# Patient Record
Sex: Female | Born: 1955
Health system: Southern US, Community
[De-identification: ages and names within clinical notes are randomized; demographics above are authoritative.]

## PROBLEM LIST (undated history)

## (undated) DIAGNOSIS — F1411 Cocaine abuse, in remission: Secondary | ICD-10-CM

## (undated) DIAGNOSIS — I10 Essential (primary) hypertension: Secondary | ICD-10-CM

## (undated) DIAGNOSIS — M1990 Unspecified osteoarthritis, unspecified site: Secondary | ICD-10-CM

## (undated) DIAGNOSIS — C189 Malignant neoplasm of colon, unspecified: Secondary | ICD-10-CM

## (undated) DIAGNOSIS — F32A Depression, unspecified: Secondary | ICD-10-CM

## (undated) DIAGNOSIS — I5022 Chronic systolic (congestive) heart failure: Secondary | ICD-10-CM

## (undated) DIAGNOSIS — G5600 Carpal tunnel syndrome, unspecified upper limb: Secondary | ICD-10-CM

## (undated) DIAGNOSIS — I428 Other cardiomyopathies: Secondary | ICD-10-CM

## (undated) DIAGNOSIS — E785 Hyperlipidemia, unspecified: Secondary | ICD-10-CM

## (undated) DIAGNOSIS — G629 Polyneuropathy, unspecified: Secondary | ICD-10-CM

## (undated) DIAGNOSIS — F329 Major depressive disorder, single episode, unspecified: Secondary | ICD-10-CM

## (undated) HISTORY — DX: Other cardiomyopathies: I42.8

## (undated) HISTORY — DX: Major depressive disorder, single episode, unspecified: F32.9

## (undated) HISTORY — DX: Hyperlipidemia, unspecified: E78.5

## (undated) HISTORY — DX: Carpal tunnel syndrome, unspecified upper limb: G56.00

## (undated) HISTORY — DX: Malignant neoplasm of colon, unspecified: C18.9

## (undated) HISTORY — DX: Essential (primary) hypertension: I10

## (undated) HISTORY — DX: Depression, unspecified: F32.A

## (undated) HISTORY — DX: Chronic systolic (congestive) heart failure: I50.22

## (undated) HISTORY — DX: Unspecified osteoarthritis, unspecified site: M19.90

## (undated) HISTORY — DX: Polyneuropathy, unspecified: G62.9

## (undated) HISTORY — DX: Cocaine abuse, in remission: F14.11

---

## 1999-08-02 ENCOUNTER — Encounter (HOSPITAL_COMMUNITY): Admission: RE | Admit: 1999-08-02 | Discharge: 1999-10-31 | Payer: Self-pay | Admitting: Internal Medicine

## 1999-08-03 ENCOUNTER — Ambulatory Visit (HOSPITAL_COMMUNITY): Admission: RE | Admit: 1999-08-03 | Discharge: 1999-08-03 | Payer: Self-pay | Admitting: Internal Medicine

## 1999-08-03 ENCOUNTER — Encounter: Payer: Self-pay | Admitting: Internal Medicine

## 1999-08-28 ENCOUNTER — Encounter (INDEPENDENT_AMBULATORY_CARE_PROVIDER_SITE_OTHER): Payer: Self-pay | Admitting: Specialist

## 1999-08-28 ENCOUNTER — Other Ambulatory Visit: Admission: RE | Admit: 1999-08-28 | Discharge: 1999-08-28 | Payer: Self-pay | Admitting: Obstetrics and Gynecology

## 1999-10-16 ENCOUNTER — Encounter: Payer: Self-pay | Admitting: Anesthesiology

## 1999-10-16 ENCOUNTER — Ambulatory Visit: Admission: RE | Admit: 1999-10-16 | Discharge: 1999-10-16 | Payer: Self-pay | Admitting: Obstetrics and Gynecology

## 1999-11-30 ENCOUNTER — Inpatient Hospital Stay (HOSPITAL_COMMUNITY): Admission: RE | Admit: 1999-11-30 | Discharge: 1999-12-03 | Payer: Self-pay | Admitting: Obstetrics and Gynecology

## 1999-11-30 ENCOUNTER — Encounter (INDEPENDENT_AMBULATORY_CARE_PROVIDER_SITE_OTHER): Payer: Self-pay | Admitting: Specialist

## 2001-06-19 ENCOUNTER — Encounter (INDEPENDENT_AMBULATORY_CARE_PROVIDER_SITE_OTHER): Payer: Self-pay | Admitting: Pulmonary Disease

## 2001-06-19 ENCOUNTER — Ambulatory Visit (HOSPITAL_COMMUNITY): Admission: RE | Admit: 2001-06-19 | Discharge: 2001-06-19 | Payer: Self-pay | Admitting: Family Medicine

## 2001-06-19 ENCOUNTER — Encounter: Payer: Self-pay | Admitting: Internal Medicine

## 2006-01-03 ENCOUNTER — Ambulatory Visit: Payer: Self-pay | Admitting: Internal Medicine

## 2006-01-03 ENCOUNTER — Inpatient Hospital Stay (HOSPITAL_COMMUNITY): Admission: EM | Admit: 2006-01-03 | Discharge: 2006-01-07 | Payer: Self-pay | Admitting: Emergency Medicine

## 2006-01-03 ENCOUNTER — Encounter (INDEPENDENT_AMBULATORY_CARE_PROVIDER_SITE_OTHER): Payer: Self-pay | Admitting: *Deleted

## 2006-01-03 ENCOUNTER — Ambulatory Visit: Payer: Self-pay | Admitting: Cardiology

## 2006-01-14 ENCOUNTER — Ambulatory Visit: Payer: Self-pay | Admitting: Internal Medicine

## 2006-05-23 ENCOUNTER — Ambulatory Visit: Payer: Self-pay | Admitting: Internal Medicine

## 2006-11-04 DIAGNOSIS — F1910 Other psychoactive substance abuse, uncomplicated: Secondary | ICD-10-CM | POA: Insufficient documentation

## 2006-11-04 DIAGNOSIS — I10 Essential (primary) hypertension: Secondary | ICD-10-CM

## 2006-11-04 DIAGNOSIS — F329 Major depressive disorder, single episode, unspecified: Secondary | ICD-10-CM

## 2006-11-04 DIAGNOSIS — I5022 Chronic systolic (congestive) heart failure: Secondary | ICD-10-CM

## 2006-11-04 DIAGNOSIS — E11319 Type 2 diabetes mellitus with unspecified diabetic retinopathy without macular edema: Secondary | ICD-10-CM

## 2008-01-09 ENCOUNTER — Ambulatory Visit: Payer: Self-pay | Admitting: Infectious Diseases

## 2008-01-09 ENCOUNTER — Ambulatory Visit: Payer: Self-pay | Admitting: Cardiology

## 2008-01-09 ENCOUNTER — Encounter (INDEPENDENT_AMBULATORY_CARE_PROVIDER_SITE_OTHER): Payer: Self-pay | Admitting: Emergency Medicine

## 2008-01-09 ENCOUNTER — Observation Stay (HOSPITAL_COMMUNITY): Admission: EM | Admit: 2008-01-09 | Discharge: 2008-01-10 | Payer: Self-pay | Admitting: Emergency Medicine

## 2008-01-27 ENCOUNTER — Ambulatory Visit: Payer: Self-pay | Admitting: Hospitalist

## 2008-01-27 LAB — CONVERTED CEMR LAB
BUN: 9 mg/dL (ref 6–23)
Blood Glucose, Fingerstick: 162
Calcium: 9.1 mg/dL (ref 8.4–10.5)
Creatinine, Ser: 0.72 mg/dL (ref 0.40–1.20)
Digitoxin Lvl: 0.5 ng/mL — ABNORMAL LOW (ref 0.8–2.0)
Hgb A1c MFr Bld: 7.8 %
Sodium: 138 meq/L (ref 135–145)

## 2008-06-16 ENCOUNTER — Encounter: Payer: Self-pay | Admitting: Licensed Clinical Social Worker

## 2008-08-19 HISTORY — PX: OTHER SURGICAL HISTORY: SHX169

## 2008-09-15 ENCOUNTER — Inpatient Hospital Stay (HOSPITAL_COMMUNITY): Admission: EM | Admit: 2008-09-15 | Discharge: 2008-09-24 | Payer: Self-pay | Admitting: Emergency Medicine

## 2008-09-15 ENCOUNTER — Ambulatory Visit: Payer: Self-pay | Admitting: *Deleted

## 2008-09-17 ENCOUNTER — Encounter (INDEPENDENT_AMBULATORY_CARE_PROVIDER_SITE_OTHER): Payer: Self-pay | Admitting: General Surgery

## 2008-09-19 DIAGNOSIS — C189 Malignant neoplasm of colon, unspecified: Secondary | ICD-10-CM

## 2008-09-19 HISTORY — DX: Malignant neoplasm of colon, unspecified: C18.9

## 2008-09-22 ENCOUNTER — Ambulatory Visit: Payer: Self-pay | Admitting: Hematology & Oncology

## 2008-09-27 ENCOUNTER — Encounter (INDEPENDENT_AMBULATORY_CARE_PROVIDER_SITE_OTHER): Payer: Self-pay | Admitting: *Deleted

## 2008-09-27 ENCOUNTER — Encounter (INDEPENDENT_AMBULATORY_CARE_PROVIDER_SITE_OTHER): Payer: Self-pay | Admitting: Internal Medicine

## 2008-09-27 ENCOUNTER — Encounter: Payer: Self-pay | Admitting: Internal Medicine

## 2008-09-27 ENCOUNTER — Ambulatory Visit: Payer: Self-pay | Admitting: *Deleted

## 2008-09-27 DIAGNOSIS — C189 Malignant neoplasm of colon, unspecified: Secondary | ICD-10-CM

## 2008-09-27 LAB — CONVERTED CEMR LAB
Albumin: 3.5 g/dL (ref 3.5–5.2)
Basophils Absolute: 0 10*3/uL (ref 0.0–0.1)
Basophils Relative: 0 % (ref 0–1)
Blood Glucose, Fingerstick: 175
CO2: 19 meq/L (ref 19–32)
Calcium: 8.9 mg/dL (ref 8.4–10.5)
Chloride: 109 meq/L (ref 96–112)
Creatinine, Ser: 0.75 mg/dL (ref 0.40–1.20)
Eosinophils Absolute: 0.1 10*3/uL (ref 0.0–0.7)
HCT: 36.9 % (ref 36.0–46.0)
Lymphocytes Relative: 22 % (ref 12–46)
MCV: 79.2 fL (ref 78.0–100.0)
Monocytes Absolute: 0.7 10*3/uL (ref 0.1–1.0)
Neutrophils Relative %: 66 % (ref 43–77)
RBC: 4.66 M/uL (ref 3.87–5.11)
RDW: 15 % (ref 11.5–15.5)
Total Bilirubin: 0.5 mg/dL (ref 0.3–1.2)

## 2008-10-01 ENCOUNTER — Encounter: Payer: Self-pay | Admitting: Licensed Clinical Social Worker

## 2008-10-01 ENCOUNTER — Ambulatory Visit: Payer: Self-pay | Admitting: *Deleted

## 2008-10-25 LAB — CBC WITH DIFFERENTIAL (CANCER CENTER ONLY)
BASO%: 0.4 % (ref 0.0–2.0)
EOS%: 1.7 % (ref 0.0–7.0)
HCT: 33.5 % — ABNORMAL LOW (ref 34.8–46.6)
LYMPH#: 1.1 10*3/uL (ref 0.9–3.3)
LYMPH%: 20.6 % (ref 14.0–48.0)
MCH: 26.5 pg (ref 26.0–34.0)
MCHC: 33.5 g/dL (ref 32.0–36.0)
MCV: 79 fL — ABNORMAL LOW (ref 81–101)
MONO%: 4.9 % (ref 0.0–13.0)
NEUT%: 72.4 % (ref 39.6–80.0)
RDW: 14.8 % — ABNORMAL HIGH (ref 10.5–14.6)

## 2008-10-25 LAB — COMPREHENSIVE METABOLIC PANEL
AST: 10 U/L (ref 0–37)
Alkaline Phosphatase: 70 U/L (ref 39–117)
BUN: 11 mg/dL (ref 6–23)
Calcium: 9.1 mg/dL (ref 8.4–10.5)
Creatinine, Ser: 0.81 mg/dL (ref 0.40–1.20)

## 2008-11-01 ENCOUNTER — Encounter (INDEPENDENT_AMBULATORY_CARE_PROVIDER_SITE_OTHER): Payer: Self-pay | Admitting: Internal Medicine

## 2008-11-01 ENCOUNTER — Ambulatory Visit: Payer: Self-pay | Admitting: Oncology

## 2008-11-01 LAB — CBC WITH DIFFERENTIAL/PLATELET
Basophils Absolute: 0 10*3/uL (ref 0.0–0.1)
Eosinophils Absolute: 0.1 10*3/uL (ref 0.0–0.5)
HGB: 11.9 g/dL (ref 11.6–15.9)
MCV: 79.2 fL — ABNORMAL LOW (ref 81.0–101.0)
MONO%: 6.1 % (ref 0.0–13.0)
NEUT#: 4.6 10*3/uL (ref 1.5–6.5)
Platelets: 182 10*3/uL (ref 145–400)
RDW: 14.9 % — ABNORMAL HIGH (ref 11.3–14.5)

## 2008-11-04 ENCOUNTER — Ambulatory Visit (HOSPITAL_COMMUNITY): Admission: RE | Admit: 2008-11-04 | Discharge: 2008-11-04 | Payer: Self-pay | Admitting: Hematology & Oncology

## 2008-11-05 ENCOUNTER — Ambulatory Visit (HOSPITAL_COMMUNITY): Admission: RE | Admit: 2008-11-05 | Discharge: 2008-11-05 | Payer: Self-pay | Admitting: Oncology

## 2008-11-08 ENCOUNTER — Encounter (INDEPENDENT_AMBULATORY_CARE_PROVIDER_SITE_OTHER): Payer: Self-pay | Admitting: Internal Medicine

## 2008-11-22 ENCOUNTER — Encounter (INDEPENDENT_AMBULATORY_CARE_PROVIDER_SITE_OTHER): Payer: Self-pay | Admitting: Internal Medicine

## 2008-11-22 LAB — COMPREHENSIVE METABOLIC PANEL
Albumin: 3.8 g/dL (ref 3.5–5.2)
CO2: 22 mEq/L (ref 19–32)
Calcium: 8.5 mg/dL (ref 8.4–10.5)
Chloride: 105 mEq/L (ref 96–112)
Glucose, Bld: 192 mg/dL — ABNORMAL HIGH (ref 70–99)
Potassium: 3.6 mEq/L (ref 3.5–5.3)
Sodium: 139 mEq/L (ref 135–145)
Total Bilirubin: 0.5 mg/dL (ref 0.3–1.2)
Total Protein: 6.7 g/dL (ref 6.0–8.3)

## 2008-11-22 LAB — CBC WITH DIFFERENTIAL/PLATELET
Eosinophils Absolute: 0.1 10*3/uL (ref 0.0–0.5)
LYMPH%: 27.1 % (ref 14.0–48.0)
MONO#: 0.6 10*3/uL (ref 0.1–0.9)
NEUT#: 3.2 10*3/uL (ref 1.5–6.5)
Platelets: 176 10*3/uL (ref 145–400)
RBC: 4.36 10*6/uL (ref 3.70–5.32)
WBC: 5.3 10*3/uL (ref 3.9–10.0)
lymph#: 1.4 10*3/uL (ref 0.9–3.3)

## 2008-11-29 ENCOUNTER — Telehealth (INDEPENDENT_AMBULATORY_CARE_PROVIDER_SITE_OTHER): Payer: Self-pay | Admitting: Internal Medicine

## 2008-11-30 ENCOUNTER — Encounter (INDEPENDENT_AMBULATORY_CARE_PROVIDER_SITE_OTHER): Payer: Self-pay | Admitting: Internal Medicine

## 2008-12-06 ENCOUNTER — Encounter (INDEPENDENT_AMBULATORY_CARE_PROVIDER_SITE_OTHER): Payer: Self-pay | Admitting: Internal Medicine

## 2008-12-06 LAB — CBC WITH DIFFERENTIAL/PLATELET
BASO%: 0 % (ref 0.0–2.0)
Basophils Absolute: 0 10*3/uL (ref 0.0–0.1)
EOS%: 0.8 % (ref 0.0–7.0)
HGB: 10.5 g/dL — ABNORMAL LOW (ref 11.6–15.9)
MCH: 26.9 pg (ref 26.0–34.0)
MCHC: 33.6 g/dL (ref 32.0–36.0)
RDW: 16 % — ABNORMAL HIGH (ref 11.3–14.5)
WBC: 4.1 10*3/uL (ref 3.9–10.0)
lymph#: 1.1 10*3/uL (ref 0.9–3.3)

## 2008-12-06 LAB — COMPREHENSIVE METABOLIC PANEL
ALT: 10 U/L (ref 0–35)
AST: 14 U/L (ref 0–37)
Albumin: 3.1 g/dL — ABNORMAL LOW (ref 3.5–5.2)
Calcium: 8.3 mg/dL — ABNORMAL LOW (ref 8.4–10.5)
Chloride: 107 mEq/L (ref 96–112)
Potassium: 3.9 mEq/L (ref 3.5–5.3)
Total Protein: 6.6 g/dL (ref 6.0–8.3)

## 2008-12-20 ENCOUNTER — Ambulatory Visit: Payer: Self-pay | Admitting: Oncology

## 2008-12-23 ENCOUNTER — Encounter (INDEPENDENT_AMBULATORY_CARE_PROVIDER_SITE_OTHER): Payer: Self-pay | Admitting: Internal Medicine

## 2008-12-23 LAB — CBC WITH DIFFERENTIAL/PLATELET
BASO%: 0.4 % (ref 0.0–2.0)
EOS%: 1 % (ref 0.0–7.0)
MCH: 27.9 pg (ref 26.0–34.0)
MCHC: 34 g/dL (ref 32.0–36.0)
MONO#: 0.6 10*3/uL (ref 0.1–0.9)
RBC: 4.1 10*6/uL (ref 3.70–5.32)
WBC: 3.6 10*3/uL — ABNORMAL LOW (ref 3.9–10.0)
lymph#: 0.9 10*3/uL (ref 0.9–3.3)

## 2008-12-23 LAB — COMPREHENSIVE METABOLIC PANEL
CO2: 28 mEq/L (ref 19–32)
Creatinine, Ser: 0.64 mg/dL (ref 0.40–1.20)
Glucose, Bld: 160 mg/dL — ABNORMAL HIGH (ref 70–99)
Total Bilirubin: 0.7 mg/dL (ref 0.3–1.2)

## 2009-01-06 ENCOUNTER — Encounter (INDEPENDENT_AMBULATORY_CARE_PROVIDER_SITE_OTHER): Payer: Self-pay | Admitting: Internal Medicine

## 2009-01-06 LAB — COMPREHENSIVE METABOLIC PANEL
Albumin: 3.5 g/dL (ref 3.5–5.2)
CO2: 25 mEq/L (ref 19–32)
Chloride: 106 mEq/L (ref 96–112)
Glucose, Bld: 235 mg/dL — ABNORMAL HIGH (ref 70–99)
Potassium: 3.3 mEq/L — ABNORMAL LOW (ref 3.5–5.3)
Sodium: 141 mEq/L (ref 135–145)
Total Protein: 6.3 g/dL (ref 6.0–8.3)

## 2009-01-06 LAB — CBC WITH DIFFERENTIAL/PLATELET
Eosinophils Absolute: 0 10*3/uL (ref 0.0–0.5)
MCV: 83 fL (ref 81.0–101.0)
MONO#: 0.5 10*3/uL (ref 0.1–0.9)
MONO%: 11.4 % (ref 0.0–13.0)
NEUT#: 2.8 10*3/uL (ref 1.5–6.5)
RBC: 3.93 10*6/uL (ref 3.70–5.32)
RDW: 19.2 % — ABNORMAL HIGH (ref 11.3–14.5)
WBC: 4.4 10*3/uL (ref 3.9–10.0)

## 2009-01-20 LAB — COMPREHENSIVE METABOLIC PANEL
ALT: 9 U/L (ref 0–35)
AST: 11 U/L (ref 0–37)
Albumin: 3.7 g/dL (ref 3.5–5.2)
BUN: 5 mg/dL — ABNORMAL LOW (ref 6–23)
Calcium: 8.2 mg/dL — ABNORMAL LOW (ref 8.4–10.5)
Chloride: 97 mEq/L (ref 96–112)
Potassium: 2.9 mEq/L — ABNORMAL LOW (ref 3.5–5.3)
Total Protein: 6.4 g/dL (ref 6.0–8.3)

## 2009-01-20 LAB — CBC WITH DIFFERENTIAL/PLATELET
BASO%: 0.5 % (ref 0.0–2.0)
Basophils Absolute: 0 10*3/uL (ref 0.0–0.1)
HCT: 31.6 % — ABNORMAL LOW (ref 34.8–46.6)
HGB: 10.7 g/dL — ABNORMAL LOW (ref 11.6–15.9)
MCHC: 33.9 g/dL (ref 31.5–36.0)
MONO#: 0.4 10*3/uL (ref 0.1–0.9)
NEUT%: 61.1 % (ref 38.4–76.8)
WBC: 3.1 10*3/uL — ABNORMAL LOW (ref 3.9–10.3)
lymph#: 0.8 10*3/uL — ABNORMAL LOW (ref 0.9–3.3)

## 2009-01-31 ENCOUNTER — Encounter (INDEPENDENT_AMBULATORY_CARE_PROVIDER_SITE_OTHER): Payer: Self-pay | Admitting: Internal Medicine

## 2009-02-03 LAB — CBC WITH DIFFERENTIAL/PLATELET
BASO%: 1 % (ref 0.0–2.0)
EOS%: 2.5 % (ref 0.0–7.0)
HGB: 10 g/dL — ABNORMAL LOW (ref 11.6–15.9)
MCH: 29.1 pg (ref 25.1–34.0)
MCHC: 33.7 g/dL (ref 31.5–36.0)
RBC: 3.44 10*6/uL — ABNORMAL LOW (ref 3.70–5.45)
RDW: 24 % — ABNORMAL HIGH (ref 11.2–14.5)
lymph#: 0.8 10*3/uL — ABNORMAL LOW (ref 0.9–3.3)
nRBC: 3 % — ABNORMAL HIGH (ref 0–0)

## 2009-02-03 LAB — TECHNOLOGIST REVIEW

## 2009-02-04 ENCOUNTER — Ambulatory Visit: Payer: Self-pay | Admitting: Oncology

## 2009-02-17 LAB — COMPREHENSIVE METABOLIC PANEL
Albumin: 3.4 g/dL — ABNORMAL LOW (ref 3.5–5.2)
CO2: 23 mEq/L (ref 19–32)
Chloride: 105 mEq/L (ref 96–112)
Glucose, Bld: 196 mg/dL — ABNORMAL HIGH (ref 70–99)
Potassium: 3.3 mEq/L — ABNORMAL LOW (ref 3.5–5.3)
Sodium: 139 mEq/L (ref 135–145)
Total Protein: 6.2 g/dL (ref 6.0–8.3)

## 2009-02-17 LAB — CBC WITH DIFFERENTIAL/PLATELET
Eosinophils Absolute: 0 10*3/uL (ref 0.0–0.5)
MONO#: 0.4 10*3/uL (ref 0.1–0.9)
NEUT#: 6.1 10*3/uL (ref 1.5–6.5)
Platelets: 130 10*3/uL — ABNORMAL LOW (ref 145–400)
RBC: 3.97 10*6/uL (ref 3.70–5.45)
RDW: 22.9 % — ABNORMAL HIGH (ref 11.2–14.5)
WBC: 7.5 10*3/uL (ref 3.9–10.3)
lymph#: 1 10*3/uL (ref 0.9–3.3)

## 2009-02-21 ENCOUNTER — Ambulatory Visit: Payer: Self-pay | Admitting: Internal Medicine

## 2009-02-21 ENCOUNTER — Encounter (INDEPENDENT_AMBULATORY_CARE_PROVIDER_SITE_OTHER): Payer: Self-pay | Admitting: Internal Medicine

## 2009-02-21 LAB — CONVERTED CEMR LAB
Alkaline Phosphatase: 81 units/L (ref 39–117)
Calcium: 8.8 mg/dL (ref 8.4–10.5)
Chloride: 105 meq/L (ref 96–112)
Cholesterol: 200 mg/dL (ref 0–200)
Creatinine, Ser: 0.68 mg/dL (ref 0.40–1.20)
GFR calc Af Amer: >60 mL/min (ref >60)
Glucose, Bld: 266 mg/dL — ABNORMAL HIGH (ref 70–99)
HDL: 57 mg/dL (ref >39)
LDL Cholesterol: 110 mg/dL — ABNORMAL HIGH (ref 0–99)
Pro B Natriuretic peptide (BNP): 246 pg/mL — ABNORMAL HIGH (ref 0.0–100.0)
Total Bilirubin: 1.1 mg/dL (ref 0.3–1.2)
Triglycerides: 166 mg/dL — ABNORMAL HIGH (ref <150)
VLDL: 33 mg/dL (ref 0–40)

## 2009-03-03 LAB — COMPREHENSIVE METABOLIC PANEL
ALT: 12 U/L (ref 0–35)
AST: 17 U/L (ref 0–37)
CO2: 22 mEq/L (ref 19–32)
Sodium: 138 mEq/L (ref 135–145)
Total Bilirubin: 0.4 mg/dL (ref 0.3–1.2)
Total Protein: 5.8 g/dL — ABNORMAL LOW (ref 6.0–8.3)

## 2009-03-03 LAB — CBC WITH DIFFERENTIAL/PLATELET
BASO%: 0.4 % (ref 0.0–2.0)
Eosinophils Absolute: 0 10*3/uL (ref 0.0–0.5)
MCHC: 33.1 g/dL (ref 31.5–36.0)
MCV: 87.7 fL (ref 79.5–101.0)
MONO#: 0.5 10*3/uL (ref 0.1–0.9)
MONO%: 10.9 % (ref 0.0–14.0)
NEUT#: 2.9 10*3/uL (ref 1.5–6.5)
RBC: 3.99 10*6/uL (ref 3.70–5.45)
RDW: 17 % — ABNORMAL HIGH (ref 11.2–14.5)
WBC: 4.7 10*3/uL (ref 3.9–10.3)
nRBC: 0 % (ref 0–0)

## 2009-03-08 ENCOUNTER — Ambulatory Visit: Payer: Self-pay | Admitting: Internal Medicine

## 2009-03-17 ENCOUNTER — Encounter (INDEPENDENT_AMBULATORY_CARE_PROVIDER_SITE_OTHER): Payer: Self-pay | Admitting: Internal Medicine

## 2009-03-17 LAB — CBC WITH DIFFERENTIAL/PLATELET
BASO%: 0.2 % (ref 0.0–2.0)
EOS%: 1.2 % (ref 0.0–7.0)
LYMPH%: 23.3 % (ref 14.0–49.7)
MCH: 29.6 pg (ref 25.1–34.0)
MCHC: 33.7 g/dL (ref 31.5–36.0)
MCV: 87.8 fL (ref 79.5–101.0)
MONO#: 0.6 10*3/uL (ref 0.1–0.9)
MONO%: 10.2 % (ref 0.0–14.0)
NEUT%: 65.1 % (ref 38.4–76.8)
Platelets: 101 10*3/uL — ABNORMAL LOW (ref 145–400)
RBC: 3.95 10*6/uL (ref 3.70–5.45)
WBC: 6 10*3/uL (ref 3.9–10.3)
nRBC: 0 % (ref 0–0)

## 2009-03-17 LAB — COMPREHENSIVE METABOLIC PANEL
ALT: 10 U/L (ref 0–35)
AST: 17 U/L (ref 0–37)
Alkaline Phosphatase: 99 U/L (ref 39–117)
BUN: 15 mg/dL (ref 6–23)
Creatinine, Ser: 0.66 mg/dL (ref 0.40–1.20)
Total Bilirubin: 0.5 mg/dL (ref 0.3–1.2)

## 2009-03-17 LAB — CEA: CEA: 1.2 ng/mL (ref 0.0–5.0)

## 2009-03-22 ENCOUNTER — Ambulatory Visit: Payer: Self-pay | Admitting: Internal Medicine

## 2009-03-22 ENCOUNTER — Encounter (INDEPENDENT_AMBULATORY_CARE_PROVIDER_SITE_OTHER): Payer: Self-pay | Admitting: Internal Medicine

## 2009-03-23 ENCOUNTER — Encounter (INDEPENDENT_AMBULATORY_CARE_PROVIDER_SITE_OTHER): Payer: Self-pay | Admitting: Internal Medicine

## 2009-03-28 ENCOUNTER — Encounter (INDEPENDENT_AMBULATORY_CARE_PROVIDER_SITE_OTHER): Payer: Self-pay | Admitting: Internal Medicine

## 2009-03-29 ENCOUNTER — Ambulatory Visit: Payer: Self-pay | Admitting: Oncology

## 2009-03-31 ENCOUNTER — Encounter (INDEPENDENT_AMBULATORY_CARE_PROVIDER_SITE_OTHER): Payer: Self-pay | Admitting: Internal Medicine

## 2009-03-31 LAB — CBC WITH DIFFERENTIAL/PLATELET
BASO%: 0.3 % (ref 0.0–2.0)
Basophils Absolute: 0 10*3/uL (ref 0.0–0.1)
EOS%: 0.6 % (ref 0.0–7.0)
Eosinophils Absolute: 0 10*3/uL (ref 0.0–0.5)
HCT: 36.5 % (ref 34.8–46.6)
HGB: 12.5 g/dL (ref 11.6–15.9)
LYMPH%: 19 % (ref 14.0–49.7)
MCH: 30.2 pg (ref 25.1–34.0)
MCHC: 34.2 g/dL (ref 31.5–36.0)
MCV: 88.2 fL (ref 79.5–101.0)
MONO#: 0.7 10*3/uL (ref 0.1–0.9)
MONO%: 9.8 % (ref 0.0–14.0)
NEUT#: 4.9 10*3/uL (ref 1.5–6.5)
NEUT%: 70.3 % (ref 38.4–76.8)
Platelets: 122 10*3/uL — ABNORMAL LOW (ref 145–400)
RBC: 4.14 10*6/uL (ref 3.70–5.45)
RDW: 16.5 % — ABNORMAL HIGH (ref 11.2–14.5)
WBC: 6.9 10*3/uL (ref 3.9–10.3)
lymph#: 1.3 10*3/uL (ref 0.9–3.3)

## 2009-03-31 LAB — COMPREHENSIVE METABOLIC PANEL
Albumin: 3.8 g/dL (ref 3.5–5.2)
Alkaline Phosphatase: 108 U/L (ref 39–117)
BUN: 13 mg/dL (ref 6–23)
Creatinine, Ser: 0.75 mg/dL (ref 0.40–1.20)
Glucose, Bld: 169 mg/dL — ABNORMAL HIGH (ref 70–99)
Total Bilirubin: 0.6 mg/dL (ref 0.3–1.2)

## 2009-04-14 ENCOUNTER — Encounter (INDEPENDENT_AMBULATORY_CARE_PROVIDER_SITE_OTHER): Payer: Self-pay | Admitting: Internal Medicine

## 2009-04-14 LAB — CBC WITH DIFFERENTIAL/PLATELET
Basophils Absolute: 0 10*3/uL (ref 0.0–0.1)
Eosinophils Absolute: 0 10*3/uL (ref 0.0–0.5)
HCT: 35.7 % (ref 34.8–46.6)
HGB: 12 g/dL (ref 11.6–15.9)
MCV: 90.5 fL (ref 79.5–101.0)
MONO%: 9 % (ref 0.0–14.0)
NEUT#: 2.6 10*3/uL (ref 1.5–6.5)
Platelets: 109 10*3/uL — ABNORMAL LOW (ref 145–400)
RDW: 17.1 % — ABNORMAL HIGH (ref 11.2–14.5)

## 2009-04-14 LAB — COMPREHENSIVE METABOLIC PANEL
Albumin: 3.6 g/dL (ref 3.5–5.2)
CO2: 24 mEq/L (ref 19–32)
Glucose, Bld: 147 mg/dL — ABNORMAL HIGH (ref 70–99)
Sodium: 141 mEq/L (ref 135–145)
Total Bilirubin: 0.5 mg/dL (ref 0.3–1.2)
Total Protein: 6.1 g/dL (ref 6.0–8.3)

## 2009-04-28 ENCOUNTER — Ambulatory Visit (HOSPITAL_COMMUNITY): Admission: RE | Admit: 2009-04-28 | Discharge: 2009-04-28 | Payer: Self-pay | Admitting: Oncology

## 2009-04-28 LAB — COMPREHENSIVE METABOLIC PANEL
ALT: 9 U/L (ref 0–35)
AST: 11 U/L (ref 0–37)
Albumin: 3.6 g/dL (ref 3.5–5.2)
CO2: 23 mEq/L (ref 19–32)
Calcium: 8.9 mg/dL (ref 8.4–10.5)
Chloride: 107 mEq/L (ref 96–112)
Creatinine, Ser: 0.77 mg/dL (ref 0.40–1.20)
Potassium: 4 mEq/L (ref 3.5–5.3)
Sodium: 141 mEq/L (ref 135–145)
Total Protein: 6.4 g/dL (ref 6.0–8.3)

## 2009-04-28 LAB — CBC WITH DIFFERENTIAL/PLATELET
BASO%: 0.2 % (ref 0.0–2.0)
EOS%: 0.8 % (ref 0.0–7.0)
HCT: 34.3 % — ABNORMAL LOW (ref 34.8–46.6)
MCH: 29.6 pg (ref 25.1–34.0)
MCHC: 33.5 g/dL (ref 31.5–36.0)
MONO#: 0.6 10*3/uL (ref 0.1–0.9)
NEUT%: 65.2 % (ref 38.4–76.8)
RBC: 3.89 10*6/uL (ref 3.70–5.45)
WBC: 5.9 10*3/uL (ref 3.9–10.3)
lymph#: 1.4 10*3/uL (ref 0.9–3.3)
nRBC: 0 % (ref 0–0)

## 2009-04-28 LAB — URINALYSIS, MICROSCOPIC - CHCC
Bilirubin (Urine): NEGATIVE
Leukocyte Esterase: NEGATIVE
Protein: 100 mg/dL
WBC, UA: NEGATIVE (ref 0–2)
pH: 5 (ref 4.6–8.0)

## 2009-05-24 ENCOUNTER — Telehealth: Payer: Self-pay | Admitting: *Deleted

## 2009-05-31 ENCOUNTER — Ambulatory Visit: Payer: Self-pay | Admitting: Oncology

## 2009-06-02 ENCOUNTER — Encounter: Payer: Self-pay | Admitting: Internal Medicine

## 2009-06-02 LAB — CBC WITH DIFFERENTIAL/PLATELET
BASO%: 0.4 % (ref 0.0–2.0)
Basophils Absolute: 0 10*3/uL (ref 0.0–0.1)
HCT: 36.2 % (ref 34.8–46.6)
HGB: 12.3 g/dL (ref 11.6–15.9)
LYMPH%: 24.6 % (ref 14.0–49.7)
MCH: 30.4 pg (ref 25.1–34.0)
MCHC: 33.9 g/dL (ref 31.5–36.0)
MONO#: 0.4 10*3/uL (ref 0.1–0.9)
NEUT%: 66.9 % (ref 38.4–76.8)
Platelets: 144 10*3/uL — ABNORMAL LOW (ref 145–400)
WBC: 5.1 10*3/uL (ref 3.9–10.3)
lymph#: 1.2 10*3/uL (ref 0.9–3.3)

## 2009-06-02 LAB — COMPREHENSIVE METABOLIC PANEL
ALT: 11 U/L (ref 0–35)
BUN: 22 mg/dL (ref 6–23)
CO2: 21 mEq/L (ref 19–32)
Calcium: 9.2 mg/dL (ref 8.4–10.5)
Chloride: 110 mEq/L (ref 96–112)
Creatinine, Ser: 0.88 mg/dL (ref 0.40–1.20)
Glucose, Bld: 149 mg/dL — ABNORMAL HIGH (ref 70–99)
Total Bilirubin: 0.3 mg/dL (ref 0.3–1.2)

## 2009-06-02 LAB — CEA: CEA: 0.5 ng/mL (ref 0.0–5.0)

## 2009-07-11 ENCOUNTER — Encounter: Payer: Self-pay | Admitting: Internal Medicine

## 2009-07-18 ENCOUNTER — Encounter: Payer: Self-pay | Admitting: Internal Medicine

## 2009-07-26 ENCOUNTER — Encounter: Payer: Self-pay | Admitting: Internal Medicine

## 2009-07-26 ENCOUNTER — Ambulatory Visit: Payer: Self-pay | Admitting: Infectious Diseases

## 2009-07-26 LAB — CONVERTED CEMR LAB
Alkaline Phosphatase: 73 units/L (ref 39–117)
BUN: 14 mg/dL (ref 6–23)
CO2: 21 meq/L (ref 19–32)
Chloride: 109 meq/L (ref 96–112)
Glucose, Bld: 144 mg/dL — ABNORMAL HIGH (ref 70–99)
Hemoglobin: 13.3 g/dL (ref 12.0–15.0)
MCHC: 33.3 g/dL (ref 30.0–36.0)
MCV: 87.3 fL (ref >=78.0)
Total Protein: 6.9 g/dL (ref 6.0–8.3)

## 2009-08-03 ENCOUNTER — Telehealth: Payer: Self-pay | Admitting: *Deleted

## 2009-08-10 ENCOUNTER — Ambulatory Visit: Payer: Self-pay | Admitting: Oncology

## 2009-09-07 ENCOUNTER — Encounter: Payer: Self-pay | Admitting: Internal Medicine

## 2009-09-07 LAB — COMPREHENSIVE METABOLIC PANEL WITH GFR
ALT: 8 U/L (ref 0–35)
AST: 11 U/L (ref 0–37)
Albumin: 4 g/dL (ref 3.5–5.2)
Alkaline Phosphatase: 64 U/L (ref 39–117)
BUN: 8 mg/dL (ref 6–23)
CO2: 28 meq/L (ref 19–32)
Calcium: 9 mg/dL (ref 8.4–10.5)
Chloride: 107 meq/L (ref 96–112)
Creatinine, Ser: 0.79 mg/dL (ref 0.40–1.20)
Glucose, Bld: 138 mg/dL — ABNORMAL HIGH (ref 70–99)
Potassium: 4.5 meq/L (ref 3.5–5.3)
Sodium: 144 meq/L (ref 135–145)
Total Bilirubin: 0.5 mg/dL (ref 0.3–1.2)
Total Protein: 6.9 g/dL (ref 6.0–8.3)

## 2009-09-07 LAB — CBC WITH DIFFERENTIAL/PLATELET
BASO%: 0.3 % (ref 0.0–2.0)
Basophils Absolute: 0 10*3/uL (ref 0.0–0.1)
EOS%: 0.6 % (ref 0.0–7.0)
Eosinophils Absolute: 0 10*3/uL (ref 0.0–0.5)
HCT: 37.3 % (ref 34.8–46.6)
HGB: 12.7 g/dL (ref 11.6–15.9)
LYMPH%: 18.7 % (ref 14.0–49.7)
MCH: 29.7 pg (ref 25.1–34.0)
MCHC: 34.1 g/dL (ref 31.5–36.0)
MCV: 86.9 fL (ref 79.5–101.0)
MONO#: 0.3 10*3/uL (ref 0.1–0.9)
MONO%: 6.2 % (ref 0.0–14.0)
NEUT#: 3.9 10*3/uL (ref 1.5–6.5)
NEUT%: 74.2 % (ref 38.4–76.8)
Platelets: 141 10*3/uL — ABNORMAL LOW (ref 145–400)
RBC: 4.29 10*6/uL (ref 3.70–5.45)
RDW: 14.1 % (ref 11.2–14.5)
WBC: 5.2 10*3/uL (ref 3.9–10.3)
lymph#: 1 10*3/uL (ref 0.9–3.3)

## 2009-09-07 LAB — CEA: CEA: <0.5 ng/mL (ref 0.0–5.0)

## 2009-11-01 ENCOUNTER — Ambulatory Visit (HOSPITAL_COMMUNITY): Admission: RE | Admit: 2009-11-01 | Discharge: 2009-11-01 | Payer: Self-pay | Admitting: Oncology

## 2009-12-20 ENCOUNTER — Ambulatory Visit: Payer: Self-pay | Admitting: Oncology

## 2009-12-23 ENCOUNTER — Encounter: Payer: Self-pay | Admitting: Internal Medicine

## 2009-12-23 LAB — CBC WITH DIFFERENTIAL/PLATELET
Eosinophils Absolute: 0 10*3/uL (ref 0.0–0.5)
HCT: 40.1 % (ref 34.8–46.6)
HGB: 13.7 g/dL (ref 11.6–15.9)
LYMPH%: 19.2 % (ref 14.0–49.7)
MONO#: 0.4 10*3/uL (ref 0.1–0.9)
NEUT#: 5.3 10*3/uL (ref 1.5–6.5)
Platelets: 150 10*3/uL (ref 145–400)
RBC: 4.52 10*6/uL (ref 3.70–5.45)
WBC: 7.2 10*3/uL (ref 3.9–10.3)

## 2009-12-23 LAB — COMPREHENSIVE METABOLIC PANEL
Albumin: 4.1 g/dL (ref 3.5–5.2)
CO2: 26 mEq/L (ref 19–32)
Glucose, Bld: 141 mg/dL — ABNORMAL HIGH (ref 70–99)
Sodium: 143 mEq/L (ref 135–145)
Total Bilirubin: 0.5 mg/dL (ref 0.3–1.2)
Total Protein: 7 g/dL (ref 6.0–8.3)

## 2009-12-23 LAB — CEA: CEA: 0.8 ng/mL (ref 0.0–5.0)

## 2009-12-26 ENCOUNTER — Encounter: Payer: Self-pay | Admitting: Internal Medicine

## 2009-12-29 ENCOUNTER — Ambulatory Visit: Payer: Self-pay | Admitting: Internal Medicine

## 2009-12-29 DIAGNOSIS — E785 Hyperlipidemia, unspecified: Secondary | ICD-10-CM

## 2009-12-30 ENCOUNTER — Encounter: Payer: Self-pay | Admitting: Internal Medicine

## 2010-01-01 LAB — CONVERTED CEMR LAB
Albumin: 3.8 g/dL (ref 3.5–5.2)
BUN: 12 mg/dL (ref 6–23)
Chloride: 106 meq/L (ref 96–112)
Cholesterol: 196 mg/dL (ref 0–200)
Creatinine, Ser: 0.73 mg/dL (ref 0.40–1.20)
Glucose, Bld: 121 mg/dL — ABNORMAL HIGH (ref 70–99)
HDL: 70 mg/dL (ref >39)
Hemoglobin: 13.2 g/dL (ref 12.0–15.0)
LDL Cholesterol: 105 mg/dL — ABNORMAL HIGH (ref 0–99)
MCV: 90.5 fL (ref >=78.0)
Platelets: 138 10*3/uL — ABNORMAL LOW (ref 150–400)
RDW: 14.1 % (ref 11.5–15.5)
Total Bilirubin: 0.4 mg/dL (ref 0.3–1.2)
Total CHOL/HDL Ratio: 2.8
Total Protein: 6.7 g/dL (ref 6.0–8.3)
Triglycerides: 106 mg/dL (ref <150)

## 2010-01-27 ENCOUNTER — Telehealth: Payer: Self-pay | Admitting: *Deleted

## 2010-02-08 ENCOUNTER — Ambulatory Visit: Payer: Self-pay | Admitting: Infectious Diseases

## 2010-02-10 ENCOUNTER — Telehealth: Payer: Self-pay | Admitting: Licensed Clinical Social Worker

## 2010-02-22 ENCOUNTER — Encounter: Payer: Self-pay | Admitting: Internal Medicine

## 2010-02-23 ENCOUNTER — Telehealth: Payer: Self-pay | Admitting: Internal Medicine

## 2010-02-23 ENCOUNTER — Telehealth: Payer: Self-pay | Admitting: *Deleted

## 2010-02-27 ENCOUNTER — Telehealth: Payer: Self-pay | Admitting: Licensed Clinical Social Worker

## 2010-03-15 ENCOUNTER — Telehealth: Payer: Self-pay | Admitting: *Deleted

## 2010-04-04 ENCOUNTER — Telehealth (INDEPENDENT_AMBULATORY_CARE_PROVIDER_SITE_OTHER): Payer: Self-pay | Admitting: *Deleted

## 2010-04-27 ENCOUNTER — Ambulatory Visit: Payer: Self-pay | Admitting: Internal Medicine

## 2010-04-27 ENCOUNTER — Encounter: Payer: Self-pay | Admitting: Internal Medicine

## 2010-04-27 LAB — CONVERTED CEMR LAB
Blood Glucose, Fingerstick: 142
Hgb A1c MFr Bld: 6.5 %

## 2010-04-28 ENCOUNTER — Ambulatory Visit: Payer: Self-pay | Admitting: Oncology

## 2010-05-01 LAB — CONVERTED CEMR LAB
Basophils Absolute: 0 10*3/uL (ref 0.0–0.1)
Hemoglobin: 12.4 g/dL (ref 12.0–15.0)
Lymphocytes Relative: 23 % (ref 12–46)
Monocytes Absolute: 0.4 10*3/uL (ref 0.1–1.0)
Neutro Abs: 4.5 10*3/uL (ref 1.7–7.7)
RDW: 13.5 % (ref 11.5–15.5)
TSH: 0.293 microintl units/mL — ABNORMAL LOW (ref 0.350–4.5)
WBC: 6.5 10*3/uL (ref 4.0–10.5)

## 2010-05-16 ENCOUNTER — Ambulatory Visit (HOSPITAL_COMMUNITY): Admission: RE | Admit: 2010-05-16 | Discharge: 2010-05-16 | Payer: Self-pay | Admitting: Oncology

## 2010-05-26 ENCOUNTER — Encounter: Payer: Self-pay | Admitting: Internal Medicine

## 2010-05-26 LAB — COMPREHENSIVE METABOLIC PANEL
Albumin: 3.8 g/dL (ref 3.5–5.2)
Alkaline Phosphatase: 60 U/L (ref 39–117)
BUN: 9 mg/dL (ref 6–23)
Creatinine, Ser: 0.69 mg/dL (ref 0.40–1.20)
Glucose, Bld: 102 mg/dL — ABNORMAL HIGH (ref 70–99)
Total Bilirubin: 0.3 mg/dL (ref 0.3–1.2)

## 2010-05-26 LAB — CBC WITH DIFFERENTIAL/PLATELET
Basophils Absolute: 0 10*3/uL (ref 0.0–0.1)
Eosinophils Absolute: 0.1 10*3/uL (ref 0.0–0.5)
HGB: 12.2 g/dL (ref 11.6–15.9)
LYMPH%: 29.8 % (ref 14.0–49.7)
MCV: 88 fL (ref 79.5–101.0)
MONO%: 7.6 % (ref 0.0–14.0)
NEUT#: 3.3 10*3/uL (ref 1.5–6.5)
Platelets: 148 10*3/uL (ref 145–400)

## 2010-05-30 ENCOUNTER — Ambulatory Visit (HOSPITAL_COMMUNITY): Admission: RE | Admit: 2010-05-30 | Discharge: 2010-05-30 | Payer: Self-pay | Admitting: Internal Medicine

## 2010-05-30 LAB — HM MAMMOGRAPHY: HM Mammogram: NEGATIVE

## 2010-07-19 ENCOUNTER — Ambulatory Visit: Payer: Self-pay | Admitting: Internal Medicine

## 2010-07-19 ENCOUNTER — Ambulatory Visit (HOSPITAL_COMMUNITY): Admission: RE | Admit: 2010-07-19 | Discharge: 2010-07-19 | Payer: Self-pay | Admitting: Internal Medicine

## 2010-07-19 DIAGNOSIS — E1140 Type 2 diabetes mellitus with diabetic neuropathy, unspecified: Secondary | ICD-10-CM | POA: Insufficient documentation

## 2010-07-19 LAB — CONVERTED CEMR LAB
Hgb A1c MFr Bld: 6.7 %
Rhuematoid fact SerPl-aCnc: <20 intl units/mL (ref 0–20)

## 2010-08-01 ENCOUNTER — Inpatient Hospital Stay (HOSPITAL_COMMUNITY): Admission: EM | Admit: 2010-08-01 | Discharge: 2010-08-03 | Payer: Self-pay | Admitting: Emergency Medicine

## 2010-08-01 ENCOUNTER — Encounter: Payer: Self-pay | Admitting: Internal Medicine

## 2010-08-01 ENCOUNTER — Ambulatory Visit: Payer: Self-pay | Admitting: Internal Medicine

## 2010-08-01 ENCOUNTER — Ambulatory Visit: Payer: Self-pay | Admitting: Cardiovascular Disease

## 2010-08-03 ENCOUNTER — Encounter: Payer: Self-pay | Admitting: Internal Medicine

## 2010-08-04 ENCOUNTER — Encounter: Payer: Self-pay | Admitting: Internal Medicine

## 2010-08-24 ENCOUNTER — Ambulatory Visit: Payer: Self-pay | Admitting: Internal Medicine

## 2010-08-24 LAB — CONVERTED CEMR LAB
BUN: 17 mg/dL (ref 6–23)
CO2: 25 meq/L (ref 19–32)
Chloride: 105 meq/L (ref 96–112)
Creatinine, Ser: 0.85 mg/dL (ref 0.40–1.20)

## 2010-09-20 ENCOUNTER — Ambulatory Visit: Payer: Self-pay | Admitting: Oncology

## 2010-10-18 ENCOUNTER — Telehealth: Payer: Self-pay | Admitting: Internal Medicine

## 2010-11-28 ENCOUNTER — Telehealth: Payer: Self-pay | Admitting: Internal Medicine

## 2010-12-10 ENCOUNTER — Encounter: Payer: Self-pay | Admitting: Oncology

## 2010-12-10 ENCOUNTER — Encounter: Payer: Self-pay | Admitting: Internal Medicine

## 2010-12-11 ENCOUNTER — Ambulatory Visit (HOSPITAL_COMMUNITY)
Admission: RE | Admit: 2010-12-11 | Discharge: 2010-12-11 | Payer: Self-pay | Source: Home / Self Care | Attending: Internal Medicine | Admitting: Internal Medicine

## 2010-12-11 ENCOUNTER — Ambulatory Visit: Admission: RE | Admit: 2010-12-11 | Discharge: 2010-12-11 | Payer: Self-pay | Source: Home / Self Care

## 2010-12-11 DIAGNOSIS — M25569 Pain in unspecified knee: Secondary | ICD-10-CM | POA: Insufficient documentation

## 2010-12-12 LAB — GLUCOSE, CAPILLARY: Glucose-Capillary: 225 mg/dL — ABNORMAL HIGH (ref 70–99)

## 2010-12-19 NOTE — Consult Note (Signed)
Summary: [facility]  [facility]   Imported By: Louretta Parma 09/15/2010 16:42:21  _____________________________________________________________________  External Attachment:    Type:   Image     Comment:   External Document

## 2010-12-19 NOTE — Discharge Summary (Signed)
Summary: Hospital Discharge Update    Hospital Discharge Update:  Date of Admission: 08/01/2010 Date of Discharge: 08/03/2010  Brief Summary:  Pt is a 55 y/o woman with h/o CHF who had been off all medications for 2 months prior to admission who presented with SOB and chest discomfort, found to be suffering from CHF exacerbation. She diuresed well and was discharged home.  Should follow-up at the outpatient clinic with Dr.Reynolds on Tuesday, Sept 20 at 2:30pm.  Pt had her BP and CHF regimen modified during hospitalization, so she will need BP check and repeat BMet.  Also, determination of her dry weight should be established.  Lab or other results pending at discharge:  ECHO  Labs needed at follow-up: Basic metabolic panel  Other labs needed at follow-up: Sleep study for obstructive sleep apnea  In a few months:  TSH, T3/T4, HgbA1c    Other follow-up issues:  Pt recently got medicaid but is destitute, so we tried to limit her med list as much as possible, however she has a significant number of medical problems that need medical management.  Pt will need outpatient sleep study as she endorses many of symptoms of OSA.  She complained of hand pain that is likely 2/2 OA and was given naproxen for this.  If she has been taking this daily, GI prophylaxis should be considered.    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   Pt's HgbA1c off meds was 6.5%, so her DM med was stopped.  Should recheck HgbA1c in 3 months.  Pt is depressed - Zoloft was started, however she should get reconnected with [facility].  Problem list changes:  Removed problem of DIARRHEA (ICD-787.91) Removed problem of ABDOMINAL PAIN (ICD-789.00) Added new problem of Question of   HYPERTHYROIDISM, SUBCLINICAL (ICD-242.90) Added new problem of Question of  SLEEP APNEA, OBSTRUCTIVE (ICD-327.23) Added new problem of Symptom of  CARPAL TUNNEL SYNDROME (ICD-354.0)  Medication list changes:  Removed medication of GLUCOTROL 10 MG TABS (GLIPIZIDE) Take 1 tablet by mouth two times a day Removed medication of FUROSEMIDE 20 MG TABS (FUROSEMIDE) Take 1 tablet by mouth once a day Changed medication from LISINOPRIL 20 MG TABS (LISINOPRIL) Take 2 tablets by mouth once a day to LISINOPRIL 40 MG TABS (LISINOPRIL) Take 1 tablet by mouth once a day - Signed Removed medication of DIGOXIN 0.125 MG  TABS (DIGOXIN) Take 1 tablet by mouth once a day Removed medication of ZOFRAN 8 MG TABS (ONDANSETRON HCL) Removed medication of PRISTIQ 100 MG XR24H-TAB (DESVENLAFAXINE SUCCINATE) Take one tabletby mouth daily Added new medication of SERTRALINE HCL 50 MG TABS (SERTRALINE HCL) Take 1 tablet by mouth once a day - Signed Removed medication of NEURONTIN 300 MG CAPS (GABAPENTIN) Take 1 tablet by mouth on day 1, then Take 1 tablet by mouth two times a day on day 2, then Take 1 tablet by mouth three times a day Added new medication of NAPROXEN 500 MG TABS (NAPROXEN) Take 1 tablet by mouth two times a day as needed for pain - Signed Added new medication of CARVEDILOL 6.25 MG TABS (CARVEDILOL) Take 1 tablet by mouth two times a day - Signed Added new medication of ASPIRIN 81 MG TABS (ASPIRIN) Take 1 tablet by mouth once a day - Signed Added new medication  of ACETAMINOPHEN 500 MG TABS (ACETAMINOPHEN) Take 1 tablet by mouth every 6 hours as needed for pain - Signed Removed medication of KLOR-CON M20 20 MEQ  TBCR (POTASSIUM CHLORIDE CRYS CR) Take 1 tablet by mouth once a day Removed medication of TYLENOL EXTRA STRENGTH 500 MG TABS (ACETAMINOPHEN) Take one tablet by mouth q 6 hrs as needed for pain Changed medication from SIMVASTATIN 40 MG  TABS (SIMVASTATIN) Take 1 tablet by mouth once a day to SIMVASTATIN 40 MG   TABS (SIMVASTATIN) Take 1 tablet by mouth once a day - Signed Changed medication from TRAZODONE HCL 50 MG TABS (TRAZODONE HCL) 1/2 tab by mouth at bedtime as needed insomnia to TRAZODONE HCL 50 MG TABS (TRAZODONE HCL) 1/2 tab by mouth at bedtime as needed insomnia - Signed Removed medication of VOLTAREN 1 % GEL (DICLOFENAC SODIUM) apply a pea-sized amount to your hand q 6 hrs as needed for pain Added new medication of FUROSEMIDE 20 MG TABS (FUROSEMIDE) Take 1 tablet by mouth daily if you weigh yourself and your weight has increased by 3 pounds or more - Signed Rx of LISINOPRIL 40 MG TABS (LISINOPRIL) Take 1 tablet by mouth once a day;  #30 x 5;  Signed;  Entered by: Danelle Berry, MD;  Authorized by: Danelle Berry, MD;  Method used: [facility] [facility] [address] [place], [place]  [address], Ph: [phone] or [phone], Fax: [phone] Rx of NITRO-DUR 0.4 MG/HR  PT24 (NITROGLYCERIN) Dissolve one tablet under the tongue every 5 minutes as needed for chest pain. If more than 2 doses needed, call 911.;  #25 x 5;  Signed;  Entered by: Danelle Berry, MD;  Authorized by: Danelle Berry, MD;  Method used: [facility] [id number]*, [address] [place], [place]  [address], Ph: [phone] or [phone], Fax: [phone] Rx of SIMVASTATIN 40 MG  TABS (SIMVASTATIN) Take 1 tablet by mouth once a day;  #30 x 5;  Signed;  Entered by: Danelle Berry, MD;  Authorized by: Danelle Berry, MD;  Method used: [facility] [facility]*, [address] [place], [place]  [address], Ph: [phone] or [phone], Fax: [phone] Rx of TRAZODONE HCL 50 MG TABS (TRAZODONE HCL) 1/2 tab by mouth at bedtime as needed insomnia;  #30 x 1;  Signed;  Entered by: Danelle Berry, MD;  Authorized by: Danelle Berry, MD;  Method used: [facility] [facility] [address] [place], [place]  [address], Ph: [phone] or  [phone], Fax: [phone] Rx of SERTRALINE HCL 50 MG TABS (SERTRALINE HCL) Take 1 tablet by mouth once a day;  #30 x 5;  Signed;  Entered by: Danelle Berry, MD;  Authorized by: Danelle Berry, MD;  Method used: [facility] [facility]*, [address] [place], [place]  [address], Ph: [phone] or [phone], Fax: [phone] Rx of NAPROXEN 500 MG TABS (NAPROXEN) Take 1 tablet by mouth two times a day as needed for pain;  #60 x 5;  Signed;  Entered by: Danelle Berry, MD;  Authorized by: Danelle Berry, MD;  Method used: [facility] [facility]*, [address] [place], [place]  [address], Ph: [phone] or [phone], Fax: [phone] Rx of CARVEDILOL 6.25 MG TABS (CARVEDILOL) Take 1 tablet by mouth two times a day;  #60 x 5;  Signed;  Entered by: Danelle Berry, MD;  Authorized by: Danelle Berry, MD;  Method used: [facility]*, [address]  [place], [place]  [address], Ph: [phone] or [phone], Fax: [phone] Rx of ASPIRIN 81 MG TABS (ASPIRIN) Take 1 tablet by mouth once a day;  #30 x 5;  Signed;  Entered by: Danelle Berry, MD;  Authorized by: Danelle Berry, MD;  Method used: [facility] [facility] [address] [place], [place]  [address], Ph: [phone] or [phone], Fax: [phone] Rx of ACETAMINOPHEN 500 MG TABS (ACETAMINOPHEN) Take 1 tablet by mouth every 6 hours as needed for pain;  #120 x 5;  Signed;  Entered by: Danelle Berry, MD;  Authorized by: Danelle Berry, MD;  Method used: [facility] [place], [place]  [address], Ph: [phone] or [phone], Fax: [phone] Rx of FUROSEMIDE 20 MG TABS (FUROSEMIDE) Take 1 tablet by mouth daily if you weigh yourself and your weight has increased by 3 pounds or more;  #20 x 1;  Signed;  Entered by: Danelle Berry, MD;  Authorized by: Danelle Berry, MD;  Method used:  [facility] [place], [place]  [address], Ph: [phone] or [phone], Fax: [phone]  The medication, problem, and allergy lists have been updated.  Please see the dictated discharge summary for details.  Discharge medications:  LISINOPRIL 40 MG TABS (LISINOPRIL) Take 1 tablet by mouth once a day NITRO-DUR 0.4 MG/HR  PT24 (NITROGLYCERIN) Dissolve one tablet under the tongue every 5 minutes as needed for chest pain. If more than 2 doses needed, call 911. SIMVASTATIN 40 MG  TABS (SIMVASTATIN) Take 1 tablet by mouth once a day TRAZODONE HCL 50 MG TABS (TRAZODONE HCL) 1/2 tab by mouth at bedtime as needed insomnia SERTRALINE HCL 50 MG TABS (SERTRALINE HCL) Take 1 tablet by mouth once a day NAPROXEN 500 MG TABS (NAPROXEN) Take 1 tablet by mouth two times a day as needed for pain CARVEDILOL 6.25 MG TABS (CARVEDILOL) Take 1 tablet by mouth two times a day ASPIRIN 81 MG TABS (ASPIRIN) Take 1 tablet by mouth once a day ACETAMINOPHEN 500 MG TABS (ACETAMINOPHEN) Take 1 tablet by mouth every 6 hours as needed for pain FUROSEMIDE 20 MG TABS (FUROSEMIDE) Take 1 tablet by mouth daily if you weigh yourself and your weight has increased by 3 pounds or more  Other patient instructions:  Please follow-up at the [facility] outpatient clinic with Dr.Reynolds on Tuesday, Sept 20 at 2:30pm.  (phone [phone]) Please weigh yourself when you get home and take note of your weight. This is your "dry weight." Please tell us what this was at your clinic follow-up appointment.  From now on you must weigh yourself daily (on the same scale) to monitor your weight. This is vital to keep control of your heart failure. If you notice that your weight has gone up by 3 pounds or more, please take 1 lasix tablet.  If your weight continues to rise, please call the outpatient clinic for an appointment and further instructions. At your follow-up appointment, an appointment for an  outpatient sleep study will be arranged.  You may resume your regular diet and activities. Please take all of your medicines as directed. Please stop using Goody's headache powders or BC powders for your headaches.  Try using tylenol instead, however if this does not work, please let your physician know at your follow-up appointment. Please wear your wrist splints at night.  Note: Hospital Discharge Medications & Other Instructions handout was printed, one copy for patient and a second copy to be  placed in hospital chart.

## 2010-12-19 NOTE — Progress Notes (Signed)
  Phone Note Call from Patient   Caller: Patient Call For: Debra Graham Details for Reason: MEDICAID CARD Summary of Call: [name] CALLED ME TO LET ME KNOW THAT SHE HAD GOTTEN HER MEDICAID CARD. [name] Initial call taken by: Theotis Barrio NT II,  January 27, 2010 12:27 PM

## 2010-12-19 NOTE — Progress Notes (Signed)
Summary: med refill/gp  Phone Note Refill Request Message from:  Fax from Pharmacy on October 18, 2010 10:08 AM  Refills Requested: Medication #1:  FUROSEMIDE 20 MG TABS Take 1 tablet by mouth daily if you weigh yourself and your weight has increased by 3 pounds or more   Last Refilled: 09/01/2010 Last appt. 10/6.  Initial call taken by: Chinita Pester RN,  October 18, 2010 10:08 AM    Prescriptions: FUROSEMIDE 20 MG TABS (FUROSEMIDE) Take 1 tablet by mouth daily if you weigh yourself and your weight has increased by 3 pounds or more  #20 x 1   Entered and Authorized by:   Laren Everts MD   Signed by:   Laren Everts MD on 10/19/2010   Method used:   [facility]        [facility] [address] [facility]ail)       [address]       [place], [place]  [address]       Ph: [phone] or [phone]       Fax: [phone]   RxID:   [id number]

## 2010-12-19 NOTE — Assessment & Plan Note (Signed)
Summary: F/U/EST/VS   Vital Signs:  Patient profile:   55 year old female Height:      67 inches (170.18 cm) Weight:      226.03 pounds (102.74 kg) BMI:     35.53 Temp:     99.1 degrees F (37.28 degrees C) oral Pulse rate:   62 / minute Pulse (ortho):   70 / minute BP sitting:   170 / 91  (right arm) BP standing:   159 / 100  Vitals Entered By: Angelina Ok RN (February 08, 2010 3:59 PM) CC: Depression Is Patient Diabetic? Yes Did you bring your meter with you today? No Pain Assessment Patient in pain? no      Nutritional Status BMI of > 30 = obese CBG Result 138  Have you ever been in a relationship where you felt threatened, hurt or afraid?No   Does patient need assistance? Functional Status Self care Ambulation Normal Comments Check up   Primary Care Provider:  Laren Everts MD  CC:  Depression.  History of Present Illness: 55 yr old woman with pmhx as described below comes to the clinic for follow up. Patient reports that because of financial situation has not been taking medication as prescribed. She is currently living without any electricity at home.   Patient reports that she has not gotten her medicaid card yet but when she does, she is going to make an appointment with me for referral for colonosocy.      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 Take 1 Tablet  By Mouth Two Times A Day 2)  Furosemide 20 Mg Tabs (Furosemide) .... Take 1 Tablet By Mouth Once A Day 3)  Lisinopril 20 Mg Tabs (Lisinopril) .... Take 2 Tablets By Mouth Once A Day 4)  Nitro-Dur 0.4 Mg/hr  Pt24 (Nitroglycerin) .... Dissolve One Tablet Under The Tongue Every 5 Minutes As Needed For Chest Pain. If More Than 2 Doses Needed, Call 911. 5)  Simvastatin 40 Mg  Tabs (Simvastatin) .... Take 1 Tablet By Mouth Once A Day 6)  Klor-Con M20 20 Meq  Tbcr (Potassium Chloride Crys Cr) .... Take 1 Tablet By Mouth Once A Day 7)  Digoxin 0.125 Mg  Tabs (Digoxin) .... Take 1 Tablet By Mouth Once A Day 8)  Trazodone Hcl 50 Mg Tabs (Trazodone Hcl) .... 1/2 Tab By Mouth At Bedtime As Needed Insomnia 9)  Zofran 8 Mg Tabs (Ondansetron Hcl) 10)  Pristiq 100 Mg Xr24h-Tab (Desvenlafaxine Succinate) .... Take 1 Tablet By Mouth Once A Day 11)  Zithromax 250 Mg Tabs (Azithromycin) .... Take 2 Tablets By Mouth For First Day, Then Take 1 Tablet By Mouth Once A Day X 4days 12)  Neurontin 300 Mg Caps (Gabapentin) .... Take 1 Tablet By Mouth On Day 1, Then Take 1 Tablet By Mouth Two Times A Day On Day 2, Then Take 1 Tablet By Mouth Three Times A  Day  Current Medications (verified): 1)  Glucotrol 10 Mg Tabs (Glipizide) .... Take 1 Tablet By Mouth Two Times A Day 2)  Furosemide 20 Mg Tabs (Furosemide) .... Take 1 Tablet By Mouth Once A Day 3)  Lisinopril 20 Mg Tabs (Lisinopril) .... Take 2 Tablets By Mouth Once A Day 4)  Nitro-Dur 0.4 Mg/hr  Pt24 (Nitroglycerin) .... Dissolve One Tablet Under The Tongue Every 5 Minutes As Needed For Chest Pain. If More Than 2 Doses Needed, Call 911. 5)  Simvastatin 40 Mg  Tabs (Simvastatin) .... Take 1 Tablet By Mouth Once A Day 6)  Klor-Con M20 20 Meq  Tbcr (Potassium Chloride Crys Cr) .... Take 1 Tablet By Mouth Once A Day 7)  Digoxin 0.125 Mg  Tabs (Digoxin) .... Take 1 Tablet By Mouth Once A Day 8)  Trazodone Hcl 50 Mg Tabs (Trazodone Hcl) .... 1/2 Tab By Mouth At Bedtime As  Needed Insomnia 9)  Zofran 8 Mg Tabs (Ondansetron Hcl) 10)  Celexa 20 Mg Tabs (Citalopram Hydrobromide) .... Take 1 Tablet By Mouth Once A Day 11)  Neurontin 300 Mg Caps (Gabapentin) .... 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  Risk Factors: Exercise: no (12/29/2009)  Risk Factors: Smoking Status: quit (02/08/2010)  Social History: Reviewed history from 09/27/2008 and no changes required. Drug use-yes in past denies smoking and drug use currently.  Review of Systems  The patient denies fever, vision loss, chest pain, syncope, headaches, hemoptysis, abdominal pain, melena, hematochezia, hematuria, muscle weakness, and difficulty walking.    Physical Exam  General:  overweight-appearing.  NAD Mouth:  Moist mucous membranes Neck:  supple.   Lungs:  Normal respiratory effort, chest expands symmetrically. coarse breath sounds, no crackles or wheezes. Heart:  Normal rate and regular rhythm. S1 and S2 normal without gallop, murmur, click, rub or other extra sounds. Abdomen:  soft, non-tender, and normal bowel sounds.   Extremities:  no edema Neurologic:  decreased light touch sensation bilateral lower extremities, alert & oriented X3, strength normal in all extremities, and gait normal.     Impression & Recommendations:  Problem # 1:  HYPERTENSION (ICD-401.9) Not taking medications. Will see if we can provide some financial assistance for meds. Follow up BP in two weeks.  Her updated medication list for this problem includes:    Furosemide 20 Mg Tabs  (Furosemide) .[name]... Take 1 tablet by mouth once a day    Lisinopril 20 Mg Tabs (Lisinopril) .[name]... Take 2 tablets by mouth once a day  Problem # 2:  ADENOCARCINOMA, COLON (ICD-153.9) Colonoscopy pending once Medicaid received.   Problem # 3:  HYPERLIPIDEMIA (ICD-272.4) Recheck FLP and cmet on follow up.  Her updated medication list for this problem includes:    Simvastatin 40 Mg Tabs (Simvastatin) .[name]... Take 1 tablet by mouth once a day  Labs Reviewed: SGOT: 14 (07/26/2009)   SGPT: 10 (07/26/2009)   HDL:57 (02/21/2009)  LDL:110 (02/21/2009)  Chol:200 (02/21/2009)  Trig:166 (02/21/2009)  Problem # 4:  DIABETES MELLITUS, TYPE II (ICD-250.00) Continue current regimen.   Her updated medication list for this  problem includes:    Glucotrol 10 Mg Tabs (Glipizide) .[name]... Take 1 tablet by mouth two times a day    Lisinopril 20 Mg Tabs (Lisinopril) .[name]... Take 2 tablets by mouth once a day  Labs Reviewed: Creat: 0.73 (12/30/2009)    Reviewed HgBA1c results: 6.6 (12/29/2009)  8.5 (02/21/2009)  Problem # 5:  DEPRESSION (ICD-311) No SSI/HSI. Continue current regimen.  Her updated medication list for this problem includes:    Trazodone Hcl 50 Mg Tabs (Trazodone hcl) .[name]... 1/2 tab by mouth at bedtime as needed insomnia    Celexa 20 Mg Tabs (Citalopram hydrobromide) .[name]... Take 1 tablet by mouth once a day  Complete Medication List: 1)  Glucotrol 10 Mg Tabs (Glipizide) .... Take 1 tablet by mouth two times a day 2)  Furosemide 20 Mg Tabs (Furosemide) .... Take 1 tablet by mouth once a day 3)  Lisinopril 20 Mg Tabs (Lisinopril) .... Take 2 tablets by mouth once a day 4)  Nitro-dur 0.4 Mg/hr Pt24 (Nitroglycerin) .... Dissolve one tablet under the tongue every 5 minutes as needed for chest pain. if more than 2 doses needed, call 911. 5)  Simvastatin 40 Mg Tabs (Simvastatin) .... Take 1 tablet by mouth once a day 6)  Klor-con M20 20 Meq Tbcr (Potassium chloride crys cr) .... Take 1 tablet by mouth  once a day 7)  Digoxin 0.125 Mg Tabs (Digoxin) .... Take 1 tablet by mouth once a day 8)  Trazodone Hcl 50 Mg Tabs (Trazodone hcl) .... 1/2 tab by mouth at bedtime as needed insomnia 9)  Zofran 8 Mg Tabs (Ondansetron hcl) 10)  Celexa 20 Mg Tabs (Citalopram hydrobromide) .... Take 1 tablet by mouth once a day 11)  Neurontin 300 Mg Caps (Gabapentin) .... Take 1 tablet by mouth on day 1, then take 1 tablet by mouth two times a day on day 2, then take 1 tablet by mouth three times a day  Patient Instructions: 1)  Please schedule a follow-up appointment as soon as medicaid card arrives or within 2 weeks to recheck BP. 2)  Call Lupita Leash tomorrow to talk about possible funds for medication.  Prevention & Chronic Care Immunizations   Influenza vaccine: Not documented    Tetanus booster: Not documented    Pneumococcal vaccine: Not documented  Colorectal Screening   Hemoccult: Not documented    Colonoscopy: Not documented  Other Screening   Pap smear: Not documented    Mammogram: Not documented   Mammogram action/deferral: Ordered  (12/29/2009)   Smoking status: quit  (02/08/2010)  Diabetes Mellitus   HgbA1C: 6.6  (12/29/2009)    Eye exam: Not documented   Diabetic eye exam action/deferral: Ophthalmology referral  (12/29/2009)    Foot exam: Not documented   Foot exam action/deferral: Do today   High risk foot: Not documented   Foot care education: Not documented    Urine microalbumin/creatinine ratio: Not documented   Urine microalbumin action/deferral: Ordered    Diabetes flowsheet reviewed?: Yes   Progress toward A1C goal: At goal  Lipids   Total Cholesterol: 196  (12/30/2009)   Lipid panel action/deferral: Lipid Panel ordered   LDL: 105  (12/30/2009)   LDL Direct: Not documented   HDL: 70  (12/30/2009)   Triglycerides: 106  (12/30/2009)    SGOT (AST): 13  (12/30/2009)   SGPT (ALT): 8  (12/30/2009)   Alkaline phosphatase: 58  (12/30/2009)   Total bilirubin: 0.4   (12/30/2009)    Lipid flowsheet reviewed?: Yes   Progress  toward LDL goal: Unchanged  Hypertension   Last Blood Pressure: 170 / 91  (02/08/2010)   Serum creatinine: 0.73  (12/30/2009)   Serum potassium 4.8  (12/30/2009)    Hypertension flowsheet reviewed?: Yes   Progress toward BP goal: Deteriorated  Self-Management Support :   Personal Goals (by the next clinic visit) :     Personal A1C goal: 7  (12/29/2009)     Personal blood pressure goal: 130/80  (12/29/2009)     Personal LDL goal: 100  (12/29/2009)    Patient will work on the following items until the next clinic visit to reach self-care goals:     Medications and monitoring: take my medicines every day, bring all of my medications to every visit, examine my feet every day  (02/08/2010)     Eating: drink diet soda or water instead of juice or soda, eat more vegetables, eat foods that are low in salt, eat baked foods instead of fried foods, eat fruit for snacks and desserts  (02/08/2010)     Activity: take a 30 minute walk every day  (02/08/2010)    Diabetes self-management support: Written self-care plan, Education handout  (02/08/2010)   Diabetes care plan printed   Diabetes education handout printed   Last diabetes self-management training by diabetes educator: 03/08/2009    Hypertension self-management support: Written self-care plan, Education handout  (02/08/2010)   Hypertension self-care plan printed.   Hypertension education handout printed    Lipid self-management support: Written self-care plan, Education handout  (02/08/2010)   Lipid self-care plan printed.   Lipid education handout printed     Vital Signs:  Patient profile:   55 year old female Height:      67 inches (170.18 cm) Weight:      226.03 pounds (102.74 kg) BMI:     35.53 Temp:     99.1 degrees F (37.28 degrees C) oral Pulse rate:   62 / minute Pulse (ortho):   70 / minute BP sitting:   170 / 91  (right arm) BP standing:   159 /  100  Vitals Entered By: Angelina Ok RN (February 08, 2010 3:59 PM)     Serial Vital Signs/Assessments:  Time      Position  BP       Pulse  Resp  Temp     By 4:18 PM   Lying LA  164/97   62                    Gladys Herbin RN 4:20 PM   Sitting   168/101  66                    Gladys Herbin RN 4:21 PM   Standing  159/100  70                    Gladys Herbin RN  Above B/P's  placed on wrong pt's chart. Angelina Ok RN  February 08, 2010 5:06 PM

## 2010-12-19 NOTE — Assessment & Plan Note (Signed)
Summary: EST-CK/FU/MEDS/CFB   Vital Signs:  Patient profile:   55 year old female Height:      67 inches (170.18 cm) Weight:      236.2 pounds (107.36 kg) BMI:     37.13 Temp:     98.4 degrees F (36.89 degrees C) oral Pulse rate:   64 / minute BP sitting:   136 / 73  (right arm) Cuff size:   large  Vitals Entered By: Cynda Familia Duncan Dull) (April 27, 2010 2:33 PM) CC: pt c/o not being able to grasp objects as well, also left side/back pain, bilateral leg/knee, Depression Is Patient Diabetic? Yes Did you bring your meter with you today? No-doesnt have on Pain Assessment Patient in pain? yes      Intensity: 7 Nutritional Status BMI of > 30 = obese CBG Result 142  Does patient need assistance? Functional Status Self care Ambulation Normal   Primary Care Provider:  Laren Everts MD  CC:  pt c/o not being able to grasp objects as well, also left side/back pain, bilateral leg/knee, and Depression.  History of Present Illness: 55 yr old woman with pmhx as described below comes to the clinic for follow up. Patient reports that she does not have any money to pay for any of her medications. She is currently not taking any meds. Patient has been living without electricity for one year. Complains of joint pains.   Depression History:      The patient is having a depressed mood most of the day but denies diminished interest in her usual daily activities.        Suicide risk questions reveal that she does not feel like life is worth living.  The patient denies that she wishes that she were dead and denies that she has thought about ending her life.        Comments:  has not been able to get cymbalta filled in 2/2 finances.   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 Take 1 Tablet By Mouth Two Times A Day 2)  Furosemide 20 Mg Tabs (Furosemide) .... Take 1 Tablet By Mouth Once A Day 3)  Lisinopril 20 Mg Tabs (Lisinopril) .... Take 2 Tablets By Mouth Once A Day 4)  Nitro-Dur 0.4 Mg/hr  Pt24 (Nitroglycerin) .... Dissolve One Tablet Under The Tongue Every 5 Minutes As Needed For Chest Pain. If More Than 2 Doses Needed, Call 911. 5)  Simvastatin 40 Mg  Tabs (Simvastatin) .... Take 1 Tablet By Mouth Once A Day 6)  Klor-Con M20 20 Meq  Tbcr (Potassium Chloride Crys Cr) .... Take 1 Tablet By Mouth Once A Day 7)  Digoxin 0.125 Mg  Tabs (Digoxin) .... Take 1 Tablet By Mouth Once A Day 8)  Trazodone Hcl 50 Mg Tabs (Trazodone Hcl) .... 1/2 Tab By Mouth At Bedtime As Needed Insomnia 9)  Zofran 8 Mg Tabs (Ondansetron Hcl) 10)  Celexa 20 Mg Tabs (Citalopram Hydrobromide) .... Take 1 Tablet By Mouth Once A Day 11)  Neurontin 300 Mg Caps (Gabapentin) .... Take 1 Tablet By Mouth On Day 1, Then Take 1 Tablet By Mouth Two Times A Day On Day 2, Then Take 1 Tablet By Mouth Three Times A Day  Current Medications (verified): 1)  Glucotrol 10 Mg Tabs (Glipizide) .... Take 1 Tablet By Mouth Two Times A Day 2)  Furosemide 20 Mg Tabs (Furosemide) .... Take 1 Tablet By Mouth Once A Day 3)  Lisinopril 20 Mg Tabs (Lisinopril) .... Take 2 Tablets By Mouth Once A Day 4)  Nitro-Dur 0.4 Mg/hr  Pt24 (Nitroglycerin) .... Dissolve One Tablet Under The Tongue Every 5 Minutes As Needed For Chest Pain. If More Than 2 Doses Needed, Call 911. 5)  Simvastatin 40 Mg  Tabs (Simvastatin) .... Take 1 Tablet By Mouth Once A  Day 6)  Klor-Con M20 20 Meq  Tbcr (Potassium Chloride Crys Cr) .... Take 1 Tablet By Mouth Once A Day 7)  Digoxin 0.125 Mg  Tabs (Digoxin) .... Take 1 Tablet By Mouth Once A Day 8)  Trazodone Hcl 50 Mg Tabs (Trazodone Hcl) .... 1/2 Tab By Mouth At Bedtime As Needed Insomnia 9)  Zofran 8 Mg Tabs (Ondansetron Hcl) 10)  Celexa 20 Mg Tabs (Citalopram Hydrobromide) .... Take 1 Tablet By Mouth Once A Day 11)  Neurontin 300 Mg Caps (Gabapentin) .... 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  Risk Factors: Exercise: no (12/29/2009)  Risk Factors: Smoking Status: quit (04/27/2010)  Social History: Reviewed history from 09/27/2008 and no changes required. Drug use-yes in past denies smoking and drug use currently.  Review of Systems  The patient denies fever, chest pain, dyspnea on exertion, hemoptysis, abdominal pain, melena, hematochezia, and hematuria.    Physical Exam  General:  overweight-appearing.  NAD Mouth:  Moist mucous membranes Neck:  supple.   Lungs:  Normal respiratory effort, chest expands symmetrically. coarse breath sounds, no crackles or wheezes. Heart:  Normal rate and regular rhythm. S1 and S2 normal without gallop, murmur, click, rub or other extra sounds. Abdomen:  soft, non-tender, and normal bowel sounds.   Msk:  normal ROM.   Extremities:  no edema Neurologic:  alert & oriented X3, strength normal in all extremities, and gait normal.     Impression & Recommendations:  Problem # 1:  HYPERTENSION (ICD-401.9) Stable. Continue current  regimen.  Her updated medication list for this problem includes:    Furosemide 20 Mg Tabs (Furosemide) .[name]... Take 1 tablet by mouth once a day    Lisinopril 20 Mg Tabs (Lisinopril) .[name]... Take 2 tablets by mouth once a day  Orders: T-CBC No Diff ([id number]) T-CMP with Estimated GFR ([address])  BP today: 136/73 Prior BP: 159/100 (02/08/2010)  Labs Reviewed: K+: 4.8 (12/30/2009) Creat: : 0.73 (12/30/2009)   Chol: 196 (12/30/2009)   HDL: 70 (12/30/2009)   LDL: 105 (12/30/2009)   TG: 106 (12/30/2009)  Problem # 2:  INADEQUATE MATERIAL RESOURCES (ICD-V60.2) Will have patient be seen by [facility] to discuss  further available resources.   Problem # 3:  HYPERLIPIDEMIA (ICD-272.4) Almost at goal. Continue current regimen. Recheck FLP on follow up.  Her updated medication list for this problem includes:    Simvastatin 40 Mg Tabs (Simvastatin) .[name]... Take 1 tablet by mouth once a day  Labs Reviewed: SGOT: 13 (12/30/2009)   SGPT: 8 (12/30/2009)   HDL:70 (12/30/2009), 57 (02/21/2009)  LDL:105 (12/30/2009), 110 (02/21/2009)  Chol:196 (12/30/2009), 200 (02/21/2009)  Trig:106 (12/30/2009), 166 (02/21/2009)  Problem # 4:  DIABETES MELLITUS, TYPE II (ICD-250.00) Good control. Continue current regimen.  Her updated medication list for this problem includes:    Glucotrol 10 Mg Tabs (Glipizide) .[name]... Take 1 tablet by mouth two times a day    Lisinopril 20 Mg Tabs (Lisinopril) .[name]... Take 2 tablets by mouth once a day  Orders: T-Hgb A1C (in-house) (42595GL) T- Capillary Blood Glucose (87564)  Labs Reviewed: Creat: 0.73 (12/30/2009)    Reviewed HgBA1c results: 6.5 (04/27/2010)  6.6 (12/29/2009)  Problem # 5:  Preventive Health Care (ICD-V70.0) Will do GI referral for colonoscopy and have her schedule mammogram.  Problem # 6:  DEPRESSION (ICD-311) Continue current regimen.  Her updated medication list for this problem includes:    Trazodone Hcl 50 Mg Tabs (Trazodone hcl) .[name]... 1/2  tab by mouth at bedtime as needed insomnia    Celexa 20 Mg Tabs (Citalopram hydrobromide) .[name]... Take 1 tablet by mouth once a day  Orders: T-TSH ([id number])  Complete Medication List: 1)  Glucotrol 10 Mg Tabs (Glipizide) .... Take 1 tablet by mouth two times a day 2)  Furosemide 20 Mg Tabs (Furosemide) .... Take 1 tablet by mouth once a day 3)  Lisinopril 20 Mg Tabs (Lisinopril) .... Take 2 tablets by mouth once a day 4)  Nitro-dur 0.4 Mg/hr Pt24 (Nitroglycerin) .... Dissolve one tablet under the tongue every 5 minutes as needed for chest pain. if more than 2 doses needed, call 911. 5)  Simvastatin 40 Mg Tabs (Simvastatin) .... Take 1 tablet by mouth once a day 6)  Klor-con M20 20 Meq Tbcr (Potassium chloride crys cr) .... Take 1 tablet by mouth once a day 7)  Digoxin 0.125 Mg Tabs (Digoxin) .... Take 1 tablet by mouth once a day 8)  Trazodone Hcl 50 Mg Tabs (Trazodone hcl) .... 1/2 tab by mouth at bedtime as needed insomnia 9)  Zofran 8 Mg Tabs (Ondansetron hcl) 10)  Celexa 20 Mg Tabs (Citalopram hydrobromide) .... Take 1 tablet by mouth once a day 11)  Neurontin 300 Mg Caps (Gabapentin) .... Take 1 tablet by mouth on day 1, then take 1 tablet by mouth two times a day on day 2, then take 1 tablet by mouth three times a day  Other Orders: Gastroenterology Referral (GI) Mammogram (Screening) (Mammo)  Patient Instructions: 1)  Please schedule a follow-up appointment in 1 month. 2)  Make appointment with Dorothe Pea. 3)  You will be called with any abnormalities in the tests scheduled or performed today.  If you don't hear from [place] within a week from when the test was performed, you can assume that your test was normal.  4)  Get mammogram and colonoscopy done.  Process Orders Check Orders Results:     [facility]: ABN not required for this insurance Tests Sent for requisitioning (May 01, 2010 12:07 PM):     04/27/2010: [facility] -- T-CBC No Diff  [[id number]] (signed)     04/27/2010: [facility] -- T-TSH [phone] (signed)  Prevention & Chronic Care Immunizations   Influenza vaccine: Not documented    Tetanus booster: Not documented    Pneumococcal vaccine: Not documented  Colorectal Screening   Hemoccult: Not documented    Colonoscopy: Not documented   Colonoscopy action/deferral: GI referral  (04/27/2010)  Other Screening   Pap smear: Not documented    Mammogram: Not documented   Mammogram action/deferral: Ordered  (04/27/2010)   Smoking status: quit  (04/27/2010)  Diabetes Mellitus   HgbA1C: 6.5  (04/27/2010)    Eye exam: Not documented   Diabetic eye exam action/deferral: Ophthalmology referral  (12/29/2009)    Foot exam: Not documented   Foot exam action/deferral: Do today   High risk foot: Not documented   Foot care education: Not documented    Urine microalbumin/creatinine ratio: Not documented   Urine microalbumin action/deferral: Ordered    Diabetes flowsheet reviewed?: Yes   Progress toward A1C goal: Unchanged  Lipids   Total Cholesterol: 196  (12/30/2009)   Lipid panel action/deferral: Lipid Panel ordered   LDL: 105  (12/30/2009)   LDL Direct: Not documented   HDL: 70  (12/30/2009)   Triglycerides: 106  (12/30/2009)    SGOT (AST): 13  (12/30/2009)   SGPT (ALT): 8  (12/30/2009)   Alkaline phosphatase: 58  (12/30/2009)   Total bilirubin: 0.4  (12/30/2009)    Lipid flowsheet reviewed?: Yes   Progress toward LDL goal: Unchanged  Hypertension   Last Blood Pressure: 136 / 73  (04/27/2010)   Serum creatinine: 0.73  (12/30/2009)   Serum potassium 4.8  (12/30/2009)    Hypertension flowsheet reviewed?: Yes   Progress toward BP goal: At goal  Self-Management Support :   Personal Goals (by the next clinic visit) :     Personal A1C goal: 7  (12/29/2009)     Personal blood pressure goal: 130/80  (12/29/2009)     Personal LDL goal: 100  (12/29/2009)    Patient will  work on the following items until the next clinic visit to reach self-care goals:     Medications and monitoring: take my medicines every day  (04/27/2010)     Eating: eat foods that are low in salt, eat baked foods instead of fried foods  (04/27/2010)     Activity: take a 30 minute walk every day  (04/27/2010)    Diabetes self-management support: Pre-printed educational material, Resources for patients handout, Written self-care plan  (04/27/2010)   Diabetes care plan printed   Last diabetes self-management training by diabetes educator: 03/08/2009    Hypertension self-management support: Pre-printed educational material, Resources for patients handout, Written self-care plan  (04/27/2010)   Hypertension self-care plan printed.    Lipid self-management support: Pre-printed educational material, Resources for patients handout, Written self-care plan  (04/27/2010)   Lipid self-care plan printed.      Resource handout printed.   [facility]see order) Schedule screening mammogram (see order)   Laboratory Results   Blood Tests   Date/Time Received: April 27, 2010 3:17 PM  Date/Time Reported: Burke Keels  April 27, 2010 3:17 PM   HGBA1C: 6.5%   (Normal Range: Non-Diabetic - 3-6%   Control Diabetic - 6-8%) CBG Random:: 142mg /dL

## 2010-12-19 NOTE — Progress Notes (Signed)
Summary: refill/ hla  Phone Note Refill Request Message from:  Patient on February 23, 2010 5:46 PM  Refills Requested: Medication #1:  GLUCOTROL 10 MG TABS Take 1 tablet by mouth two times a day  Medication #2:  FUROSEMIDE 20 MG TABS Take 1 tablet by mouth once a day  Medication #3:  LISINOPRIL 20 MG TABS Take 2 tablets by mouth once a day  Medication #4:  SIMVASTATIN 40 MG  TABS Take 1 tablet by mouth once a day Initial call taken by: Marin Roberts RN,  February 23, 2010 5:47 PM    Prescriptions: LISINOPRIL 20 MG TABS (LISINOPRIL) Take 2 tablets by mouth once a day  #60 x 3   Entered and Authorized by:   Laren Everts MD   Signed by:   Laren Everts MD on 02/23/2010   Method used:   Telephoned to ...       [facility] (retail)       [address]       [place], [place]  [address]       Ph: [phone]       Fax: [phone]   RxID:   [id number] SIMVASTATIN 40 MG  TABS (SIMVASTATIN) Take 1 tablet by mouth once a day  #31 x 3   Entered and Authorized by:   Laren Everts MD   Signed by:   Laren Everts MD on 02/23/2010   Method used:   Telephoned to ...       [facility] (retail)       [address]       [place], [place]  [address]       Ph: [phone]       Fax: [phone]   RxID:   [id number] FUROSEMIDE 20 MG TABS (FUROSEMIDE) Take 1 tablet by mouth once a day  #31 x 11   Entered and Authorized by:   Laren Everts MD   Signed by:   Laren Everts MD on 02/23/2010   Method used:   Telephoned to ...       [facility] (retail)       [address]       [place], [place]  [address]       Ph: [phone]       Fax: [phone]   RxID:   [id number] GLUCOTROL 10 MG TABS (GLIPIZIDE) Take 1 tablet by mouth two times a day  #62 x 3   Entered and Authorized by:   Laren Everts MD   Signed  by:   Laren Everts MD on 02/23/2010   Method used:   Telephoned to ...       [facility] (retail)       [address]       [place], [place]  [address]       Ph: [phone]       Fax: [phone]   RxID:   [id number]

## 2010-12-19 NOTE — Progress Notes (Signed)
Summary: refill/ hla  Phone Note Refill Request Message from:  Patient on February 23, 2010 5:48 PM  Refills Requested: Medication #1:  NITRO-DUR 0.4 MG/HR  PT24 Dissolve one tablet under the tongue every 5 minutes as needed for chest pain. If more than 2 doses needed  Medication #2:  KLOR-CON M20 20 MEQ  TBCR Take 1 tablet by mouth once a day  Medication #3:  DIGOXIN 0.125 MG  TABS Take 1 tablet by mouth once a day  Medication #4:  NEURONTIN 300 MG CAPS Take 1 tablet by mouth on day 1 Initial call taken by: Marin Roberts RN,  February 23, 2010 5:48 PM    Prescriptions: NEURONTIN 300 MG CAPS (GABAPENTIN) Take 1 tablet by mouth on day 1, then Take 1 tablet by mouth two times a day on day 2, then Take 1 tablet by mouth three times a day  #90 x 3   Entered and Authorized by:   Laren Everts MD   Signed by:   Laren Everts MD on 02/23/2010   Method used:   Telephoned to ...       [facility] (retail)       [address]       [place], [place]  [address]       Ph: [phone]       Fax: [phone]   RxID:   [id number] DIGOXIN 0.125 MG  TABS (DIGOXIN) Take 1 tablet by mouth once a day  #31 x 3   Entered and Authorized by:   Laren Everts MD   Signed by:   Laren Everts MD on 02/23/2010   Method used:   Telephoned to ...       [facility] (retail)       [address]       [place], [place]  [address]       Ph: [phone]       Fax: [phone]   RxID:   [id number] KLOR-CON M20 20 MEQ  TBCR (POTASSIUM CHLORIDE CRYS CR) Take 1 tablet by mouth once a day  #31 x 3   Entered and Authorized by:   Laren Everts MD   Signed by:   Laren Everts MD on 02/23/2010   Method used:   Telephoned to ...       [facility] (retail)       [address]       [place], [place]  [address]       Ph: [phone]       Fax:  [phone]   RxID:   [id number] NITRO-DUR 0.4 MG/HR  PT24 (NITROGLYCERIN) Dissolve one tablet under the tongue every 5 minutes as needed for chest pain. If more than 2 doses needed, call 911.  #25 x 4   Entered and Authorized by:   Laren Everts MD   Signed by:   Laren Everts MD on 02/23/2010   Method used:   Telephoned to ...       [facility] (retail)       [address]       [place], [place]  [address]       Ph: [phone]       Fax: [phone]   RxID:   [id number]   Appended Document: refill/ hla called to pharm, message to donna t. of 40.00 copay

## 2010-12-19 NOTE — Consult Note (Signed)
Summary: [facility]  [facility]   Imported By: Louretta Parma 06/26/2010 10:38:39  _____________________________________________________________________  External Attachment:    Type:   Image     Comment:   External Document  Appended Document: [facility] CHF:  Not an Beta-blocker 2/2 Heart Rate.

## 2010-12-19 NOTE — Miscellaneous (Signed)
Summary: [facility]  [facility]   Imported By: Florinda Marker 12/27/2009 13:56:16  _____________________________________________________________________  External Attachment:    Type:   Image     Comment:   External Document
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
## 2010-12-19 NOTE — Miscellaneous (Signed)
Summary: Hospital admission  INTERNAL MEDICINE ADMISSION HISTORY AND PHYSICAL  Attending: Dr Doneen Poisson  1st contact: Dr Claudette Laws [phone] 2nd contact: Dr Eben Burow [phone]  PCP: Dr Juanell Fairly  CC: SOB, chest discomfort  HPI:  Debra Graham is a 55 year old W who presented to the ED with shortness of breath and chest discomfort. She has a PMH of CHF (LVEF 20-25% in 12/2007), is prescribed Lasix 20mg  daily, but has not been able to afford her medications and, therefore, has not taken them for the past two months. She began noticing increased shortness of breath both at rest and with exertion a few weeks ago; however, these symptoms were intermittent. She has also had a mild cough productive of a small amount of white/clear sputum for the past few weeks. She has also noticed increased orthopnea over the past few days. Yesterday, however, she began feeling more short of breath and increasingly fatigued. She went to bed early but awoke several times gasping for breath. In the morning, she took two nitroglycerin 5 minutes apart, after which she felt that she was breathing easier but began feeling pain between her shoulder blades. This chest discomfort has been constant and is 5/10 without any alleviating or exacerbating factors. She denied fever, chills, vomiting, dizziness, dysuria, change in bowel habits, or other systemic symptoms. She has had occassional nausea over the past few weeks.  ALLERGIES: NKDA  PAST MEDICAL HISTORY: Congestive heart failure sec to nonischemic cardiomyopathy Depression Diabetes mellitus, type II; HbA1C 6.7 (06/2010) Hypertension h/o Cocaine use   MEDICATIONS: GLUCOTROL 10 MG TABS (GLIPIZIDE) Take 1 tablet by mouth two times a day FUROSEMIDE 20 MG TABS (FUROSEMIDE) Take 1 tablet by mouth once a day LISINOPRIL 20 MG TABS (LISINOPRIL) Take 2 tablets by mouth once a day NITRO-DUR 0.4 MG/HR  PT24 (NITROGLYCERIN) Dissolve one tablet under the tongue every 5 minutes as  needed for chest pain. If more than 2 doses needed, call 911. SIMVASTATIN 40 MG  TABS (SIMVASTATIN) Take 1 tablet by mouth once a day KLOR-CON M20 20 MEQ  TBCR (POTASSIUM CHLORIDE CRYS CR) Take 1 tablet by mouth once a day DIGOXIN 0.125 MG  TABS (DIGOXIN) Take 1 tablet by mouth once a day TRAZODONE HCL 50 MG TABS (TRAZODONE HCL) 1/2 tab by mouth at bedtime as needed insomnia ZOFRAN 8 MG TABS (ONDANSETRON HCL)  PRISTIQ 100 MG XR24H-TAB (DESVENLAFAXINE SUCCINATE) Take one tabletby mouth daily NEURONTIN 300 MG CAPS (GABAPENTIN) Take 1 tablet by mouth on day 1, then Take 1 tablet by mouth two times a day on day 2, then Take 1 tablet by mouth three times a day VOLTAREN 1 % GEL (DICLOFENAC SODIUM) apply a pea-sized amount to your hand q 6 hrs as needed for pain TYLENOL EXTRA STRENGTH 500 MG TABS (ACETAMINOPHEN) Take one tablet by mouth q 6 hrs as needed for pain   SOCIAL HISTORY: Widowed. Applying for disability. 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 Remote hx of cocaine abuse. Denies EtOH, tobacco, or drug use.    FAMILY HISTORY: Mother - congestive heart failure  ROS: reports some stiffness and pain in R hand and shoulders; otherwise as per HPI  VITALS: T: 98.3 P: 71  BP: 174/81 R: 18 O2SAT: 99% ON:RA  PHYSICAL EXAM: General:  alert, cooperative to examination, NAD.   Head:  normocephalic and atraumatic.   Eyes:  vision grossly intact, pupils equal, pupils round, pupils reactive to light, no injection and anicteric.   Mouth:  pharynx pink and moist, no erythema,  and no exudates. Neck:  supple, full ROM, no JVD.   Lungs:  no accessory muscle use, bibasilar crackles, and no wheezes.  Heart:  normal rate, regular rhythm, no murmur, no gallop, and no rub.   Abdomen:  soft, non-tender, normal bowel sounds, no distention, no guarding, no rebound tenderness. Msk:  no joint swelling, no joint warmth, and no redness over joints.   Pulses:  2+ DP/PT pulses bilaterally Extremities:  No  cyanosis, clubbing. Trace lower extremity edema.  Neurologic:  alert & oriented X3, cranial nerves grossly intact,    Skin:  turgor normal and no rashes.   Psych:  Oriented X3, memory intact for recent and remote, normally interactive, good eye contact.   LABS:   WBC                                      5.4               4.0-10.5         K/uL  RBC                                      4.38              3.87-5.11        MIL/uL  Hemoglobin (HGB)                         13.1              12.0-15.0        g/dL  Hematocrit (HCT)                         38.6              36.0-46.0        %  MCV                                      88.1              78.0-100.0       fL  MCH -                                    29.9              26.0-34.0        pg  MCHC                                     33.9              30.0-36.0        g/dL  RDW                                      14.2              11.5-15.5        %  Platelet Count (  PLT)                     129        l      150-400          K/uL  Neutrophils, %                           61                43-77            %  Lymphocytes, %                           31                12-46            %  Monocytes, %                             8                 3-12             %  Eosinophils, %                           1                 0-5              %  Basophils, %                             0                 0-1              %  Neutrophils, Absolute                    3.3               1.7-7.7          K/uL  Lymphocytes, Absolute                    1.6               0.7-4.0          K/uL  Monocytes, Absolute                      0.4               0.1-1.0          K/uL  Eosinophils, Absolute                    0.1               0.0-0.7          K/uL  Basophils, Absolute                      0.0               0.0-0.1          K/uL   Sodium (NA)  142               135-145          mEq/L  Potassium (K)                             4.0               3.5-5.1          mEq/L  Chloride                                 114        h      96-112           mEq/L  CO2                                      23                19-32            mEq/L  Glucose                                  119        h      70-99            mg/dL  BUN                                      8                 6-23             mg/dL  Creatinine                               0.57              0.4-1.2          mg/dL  GFR, Est Non African American            >60               >60              mL/min  GFR, Est African American                >60               >60              mL/min    Oversized comment, see footnote  1  Calcium                                  8.8               8.4-10.5         mg/dL   I-STAT-Comment                           SEE NOTE.  Oversized comment, see footnote  1  CKMB, POC                                1.1               1.0-8.0          ng/mL  Troponin I, POC                          <0.05             0.00-0.09        ng/mL  Myoglobin, POC                           55.4              12-200           ng/mL  Beta Natriuretic Peptide                 224.0      h      0.0-100.0        pg/mL CXR: IMPRESSION:   Cardiomegaly and pulmonary vascular congestion without overt edema.  ECG: sinus rhythm, non-specific T wave changes  Feb 20th 2009 SUMMARY   -  The left ventricle was mildly dilated. Overall left ventricular         systolic function was severely reduced. Left ventricular         ejection fraction was estimated , range being 20 % to 25 %.         There was severe diffuse left ventricular hypokinesis. Left         ventricular wall thickness was mildly increased. Doppler         parameters were consistent with high left ventricular filling         pressure.   -  There was mild to moderate mitral annular calcification. There         was mild to moderate mitral valvular regurgitation.   -  The left atrium was mildly  dilated. The interatrial septum bows         from left to right, consistent with increased left atrial         pressure.   -  The estimated peak pulmonary artery systolic pressure was         moderately increased.  ASSESSMENT AND PLAN: (1) CHF exacerbation. Will admit to telemetry. Diurese with Lasix 80mg  IV two times a day. Strict I&Os, daily weights. Will get 2D echocardiogram to estimate LVEF (last evaluated 2009).  (2) Chest discomfort. Most likely secondary to CHF exacerbation. However, will cycle cardiac enzymes x 3 to rule out ACS. Will also write for nitroglycerin SL as needed for chest pain. Check TSH for risk stratification.  (3) DM2: Initiate SSI, CBGs QAC and QHS. Holding glipizide for now but plan to restart once clinically improving and eating well. Will recheck HbA1C.  (4) HTN: Continue lisinopril and carvedilol.   (5) HL: Continue simvastatin. Will check fasting lipid panel.   (6) Arthritis: Continue Tylenol as needed for pain.    (7) DEPRESSION/ANXIETY: Continue Pristiq and trazadone for insomnia.  (8) VTE PROPH: lovenox  (9) Financial stress/lack of resources: Will consult SW for medication assistance.   Attending Physician: I have seen and examined the patient. I reviewed the  resident/fellow note and agree with the findings and plan of care as documented. My additions and revisions are included.   Signature:

## 2010-12-19 NOTE — Progress Notes (Signed)
Summary: test strips from [employer]  Phone Note Outgoing Call   Call placed by: Jamison Neighbor RD,CDE,  Apr 04, 2010 5:11 PM Summary of Call: called patient to let her know test strips here from [employer]: left message that they'd be at front desk for her to pick up.

## 2010-12-19 NOTE — Progress Notes (Signed)
Summary: 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  Call placed to: [employer] of Call: Gaspar Cola at [place] at [phone] to find out why they did not assist patient with meds per my phone call of 3/25.  Lynden Ang said that they needed all new scripts for all of the patient's meds because most of them were dated back to August. They didn't realize that when I called because they were looking at other dates.   I told Lynden Ang that I would ask Triage to help me with these.  Fax is [phone] for [facility].

## 2010-12-19 NOTE — Letter (Signed)
Summary: [facility]  [facility]   Imported By: Margie Billet 03/02/2010 14:18:26  _____________________________________________________________________  External Attachment:    Type:   Image     Comment:   External Document  Appended Document: [facility] Patient is to return to [facility] in 3 months. Will follow up.

## 2010-12-19 NOTE — Consult Note (Signed)
Summary: [facility]   Imported By: Florinda Marker 01/09/2010 14:25:07  _____________________________________________________________________  External Attachment:    Type:   Image     Comment:   External Document

## 2010-12-19 NOTE — Assessment & Plan Note (Signed)
Summary: CHECKUP/SB.   Vital Signs:  Patient profile:   55 year old female Height:      67 inches (170.18 cm) Weight:      230.3 pounds (104.68 kg) BMI:     36.20 Temp:     99.0 degrees F (37.22 degrees C) oral Pulse rate:   83 / minute BP sitting:   156 / 83  (right arm) Cuff size:   large  Vitals Entered By: Theotis Barrio NT II (December 29, 2009 10:37 AM)  Nutrition Counseling: Patient's BMI is greater than 25 and therefore counseled on weight management options. CC: TOTAL BODY ACHE X 4 DAYS / HEADACHE-RUNNY NOSE-SORE THROAT - PRODUCTIVE COUGH X 4 DAYS, Is Patient Diabetic? Yes Did you bring your meter with you today? NEED METER Nutritional Status BMI of > 30 = obese  Have you ever been in a relationship where you felt threatened, hurt or afraid?No   Does patient need assistance? Functional Status Self care Ambulation Normal Comments TOTAL BODY ACHE / RUNNY NOSE/ SORE THROAT /  PRODUCTIVE COUGH X 4 DAYS    Primary Care Provider:  Marinda Elk MD  CC:  TOTAL BODY ACHE X 4 DAYS / HEADACHE-RUNNY NOSE-SORE THROAT - PRODUCTIVE COUGH X 4 DAYS and .  History of Present Illness: 55 yr old woman with pmhx as described below comes to the clinic for check up. Patient being closely followed by Oncologist. She has not gotten Colonoscopy becuase she has not got Medicaid. Patient in the process of getting the Medicaid card. Once patient gets the card she will go to Dr. Elnoria Howard to get colonoscopy.   Reports to have cold, subjective fever, myalgias, headaches, yellowish phlegm since saturday.   Patient is going to [facility] for depression. Denies SSI/HI. Patient has an appointment on friday of this week.   Patient states that neuropathic pain is not getting any better with Vitamin B12. Would like to start medication for pain.   Patient has regular bowel movements.  Patient has not been taking lasix for one month.   Depression History:      The patient denies a  depressed mood most of the day and a diminished interest in her usual daily activities.         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 Take 1 Tablet By Mouth Two Times A Day 2)  Furosemide 20 Mg Tabs (Furosemide) .... Take 1 Tablet By Mouth Once A Day 3)  Lisinopril 20 Mg Tabs (Lisinopril) .... By Mouth Once Daily 4)  Paxil 30 Mg Tabs (Paroxetine Hcl) .... Take 1 Tablet By Mouth Once A Day 5)  Nitro-Dur 0.4 Mg/hr  Pt24 (Nitroglycerin) .... Dissolve One Tablet Under The Tongue Every 5 Minutes As Needed For Chest Pain. If More Than 2 Doses Needed, Call 911. 6)  Simvastatin 40 Mg  Tabs (Simvastatin) .... Take 1 Tablet By  Mouth Once A Day 7)  Klor-Con M20 20 Meq  Tbcr (Potassium Chloride Crys Cr) .... Take 1 Tablet By Mouth Once A Day 8)  Digoxin 0.125 Mg  Tabs (Digoxin) .... Take 1 Tablet By Mouth Once A Day 9)  Trazodone Hcl 50 Mg Tabs (Trazodone Hcl) .... 1/2 Tab By Mouth At Bedtime As Needed Insomnia 10)  Wellbutrin 100 Mg Tabs (Bupropion Hcl) .... Take 1 Tablet By Mouth Once A Day 11)  Eloxatin 100 Mg/104ml Soln (Oxaliplatin) 12)  Fluorouracil 2.5 Gm/86ml Soln (Fluorouracil) 13)  Ultram 50 Mg Tabs (Tramadol Hcl) .... Take 1 Tablet By Mouth Once A Day 14)  Zofran 8 Mg Tabs (Ondansetron Hcl)  Current Medications (verified): 1)  Glucotrol 10 Mg Tabs (Glipizide) .... Take 1 Tablet By Mouth  Two Times A Day 2)  Furosemide 20 Mg Tabs (Furosemide) .... Take 1 Tablet By Mouth Once A Day 3)  Lisinopril 20 Mg Tabs (Lisinopril) .... By Mouth Once Daily 4)  Nitro-Dur 0.4 Mg/hr  Pt24 (Nitroglycerin) .... Dissolve One Tablet Under The Tongue Every 5 Minutes As Needed For Chest Pain. If More Than 2 Doses Needed, Call 911. 5)  Simvastatin 40 Mg  Tabs (Simvastatin) .... Take 1 Tablet By Mouth Once A Day 6)  Klor-Con M20 20 Meq  Tbcr (Potassium Chloride Crys Cr) .... Take 1 Tablet By Mouth Once A Day 7)  Digoxin 0.125 Mg  Tabs (Digoxin) .... Take 1 Tablet By Mouth Once A Day 8)  Trazodone Hcl 50 Mg Tabs (Trazodone Hcl) .... 1/2 Tab By Mouth At Bedtime As Needed Insomnia 9)  Zofran 8 Mg Tabs (Ondansetron Hcl) 10)  Pristiq 100 Mg Xr24h-Tab (Desvenlafaxine Succinate) .... 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  Risk Factors: Exercise: no (12/29/2009)  Risk Factors: Smoking Status: quit (12/29/2009)  Social History: Reviewed history from 09/27/2008 and no changes required. Drug use-yes in past denies smoking and drug use currently.  Review of Systems       The patient complains of fever and headaches.  The patient denies chest pain, dyspnea on exertion, hemoptysis, abdominal pain, melena, hematochezia, hematuria, muscle weakness, and unusual weight change.    Physical Exam  General:  overweight-appearing.  NAD Mouth:  Moist mucous membranes Neck:  supple.   Lungs:  Normal respiratory effort, chest expands symmetrically. coarse breath sounds, no crackles or wheezes. Heart:  Normal rate and regular rhythm. S1 and S2 normal without gallop, murmur, click, rub  or other extra sounds. Abdomen:  soft, non-tender, and normal bowel sounds.   Msk:  normal ROM.   Extremities:  no edema Neurologic:  decreased light touch sensation bilateral lower extremities, alert & oriented X3, strength normal in all extremities, and gait normal.     Impression & Recommendations:  Problem # 1:  UPPER RESPIRATORY INFECTION, ACUTE (ICD-465.9) Concerning for bacterial infection. Will have patient start taking zithromax as prescribed. Reasses on follow up.  Problem # 2:  HYPERTENSION (ICD-401.9) May be due to acute illness, although patient also not taking medicaiton as prescribed. Will have her restart taking lasix as directed and follow up. Check labs for any abnormalities.  Her updated medication list for this problem includes:    Furosemide 20 Mg  Tabs (Furosemide) .[name]... Take 1 tablet by mouth once a day    Lisinopril 20 Mg Tabs (Lisinopril) .[name]... Take 2 tablets by mouth once a day  BP today: 156/83 Prior BP: 169/93 (07/26/2009)  Labs Reviewed: K+: 4.3 (07/26/2009) Creat: : 0.74 (07/26/2009)   Chol: 200 (02/21/2009)   HDL: 57 (02/21/2009)   LDL: 110 (02/21/2009)   TG: 166 (02/21/2009)  Problem # 3:  ADENOCARCINOMA, COLON (ICD-153.9) Instructed to make appointment with Dr. Elnoria Howard for colonoscopy as soon as she get medicaid.   Orders: T-CBC No Diff ([id number])  Problem # 4:  DIABETES MELLITUS, TYPE II (ICD-250.00) At goal. Patient was instructed to continue current regimen and diet. Ordered Diabetic eye exam. Patient was started on neurontin for diabetic neuropathy. Follow up response to medication. Diabetic Foot exam done today>> High risk. Educated on proper diabetic foot care.   Her updated medication list for this problem includes:    Glucotrol 10 Mg Tabs (Glipizide) .[name]... Take 1 tablet by mouth two times a day    Lisinopril 20 Mg Tabs (Lisinopril) .[name]... Take 2 tablets by mouth once a day  Orders: T-Hgb A1C (in-house) [phone]) Ophthalmology Referral  (Ophthalmology) T-Lipid Profile [phone])  Labs Reviewed: Creat: 0.74 (07/26/2009)    Reviewed HgBA1c results: 6.6 (12/29/2009)  8.5 (02/21/2009)  Problem # 5:  HYPERLIPIDEMIA (ICD-272.4) Not at goal <100. Recheck FLP and cmet. May need to increase statin on follow up.   Her updated medication list for this problem includes:    Simvastatin 40 Mg Tabs (Simvastatin) .[name]... Take 1 tablet by mouth once a day  Labs Reviewed: SGOT: 14 (07/26/2009)   SGPT: 10 (07/26/2009)   HDL:57 (02/21/2009)  LDL:110 (02/21/2009)  Chol:200 (02/21/2009)  Trig:166 (02/21/2009)  Problem # 6:  DEPRESSION (ICD-311) Stable. No SSI/HI. per [facility]. will follow up.  The following medications were removed from the medication list:    Paxil 30 Mg Tabs (Paroxetine hcl) .[name]... Take 1 tablet by mouth once a day    Wellbutrin 100 Mg Tabs (Bupropion hcl) .[name]... Take 1 tablet by mouth once a day Her updated medication list for this problem includes:    Trazodone Hcl 50 Mg Tabs (Trazodone hcl) .[name]... 1/2 tab by mouth at bedtime as needed insomnia    Pristiq 100 Mg Xr24h-tab (Desvenlafaxine succinate) .[name]... Take 1 tablet by mouth once a day  Problem # 7:  CONGESTIVE HEART FAILURE (ICD-428.0) No signs of heart failure at this time. Will continue current regimen and follow up.  Her updated medication list for this problem includes:    Furosemide 20 Mg Tabs (Furosemide) .[name]... Take 1 tablet by mouth once a day    Lisinopril 20 Mg Tabs (Lisinopril) ..... By mouth once daily    Digoxin 0.125 Mg Tabs (Digoxin) .[name]... Take 1 tablet by mouth once a day  Problem # 8:  Preventive Health Care (ICD-V70.0) Mammogram ordered. Screening for thyroid disorder done today.  Complete Medication List: 1)  Glucotrol 10 Mg Tabs (Glipizide) .... Take 1 tablet by mouth two times a day 2)  Furosemide 20 Mg Tabs (Furosemide) .... Take 1 tablet by mouth once a day 3)  Lisinopril 20 Mg Tabs (Lisinopril) .... Take 2 tablets by mouth  once a day 4)  Nitro-dur 0.4 Mg/hr Pt24 (Nitroglycerin) .... Dissolve one tablet under the tongue every 5 minutes as needed for chest pain. if more than 2 doses needed, call 911. 5)  Simvastatin 40 Mg Tabs (Simvastatin) .... Take 1 tablet by mouth once a  day 6)  Klor-con M20 20 Meq Tbcr (Potassium chloride crys cr) .... Take 1 tablet by mouth once a day 7)  Digoxin 0.125 Mg Tabs (Digoxin) .... Take 1 tablet by mouth once a day 8)  Trazodone Hcl 50 Mg Tabs (Trazodone hcl) .... 1/2 tab by mouth at bedtime as needed insomnia 9)  Zofran 8 Mg Tabs (Ondansetron hcl) 10)  Pristiq 100 Mg Xr24h-tab (Desvenlafaxine succinate) .... Take 1 tablet by mouth once a day 11)  Zithromax 250 Mg Tabs (Azithromycin) .... Take 2 tablets by mouth for first day, then take 1 tablet by mouth once a day x 4days 12)  Neurontin 300 Mg Caps (Gabapentin) .... Take 1 tablet by mouth on day 1, then take 1 tablet by mouth two times a day on day 2, then take 1 tablet by mouth three times a day  Other Orders: T- Capillary Blood Glucose ([id number]) Mammogram (Screening) (Mammo) T-Comprehensive Metabolic Panel ([id number]) T-TSH ([id number])  Patient Instructions: 1)  Please schedule a follow-up appointment in 1 month recheck blood pressure. 2)  Start taking Neurontin for neuropathic pain. 3)  Take Zithromax as prescribed. 4)  Get mammogram, go to Diabetic Eye Exam. Schedule Colonoscopy with Dr. Elnoria Howard once you get Medicaid. 5)  You will be called with any abnormalities in the tests scheduled or performed today.  If you don't hear from [place] within a week from when the test was performed, you can assume that your test was normal.  Prescriptions: NEURONTIN 300 MG CAPS (GABAPENTIN) Take 1 tablet by mouth on day 1, then Take 1 tablet by mouth two times a day on day 2, then Take 1 tablet by mouth three times a day  #90 x 3   Entered and Authorized by:   Laren Everts MD   Signed by:   Laren Everts MD on 12/29/2009    Method used:   Print then Give to Patient   RxID:   [id number] ZITHROMAX 250 MG TABS (AZITHROMYCIN) Take 2 tablets by mouth for first day, then Take 1 tablet by mouth once a day X 4days  #6 x 0   Entered and Authorized by:   Laren Everts MD   Signed by:   Laren Everts MD on 12/29/2009   Method used:   Print then Give to Patient   RxID:   [id number]  Process Orders Check Orders Results:     [facility]: ABN not required for this insurance Tests Sent for requisitioning (January 01, 2010 9:38 PM):     12/29/2009: [facility] -- T-Lipid Profile [phone] (signed)     12/29/2009: [facility] -- T-CBC No Diff [[id number]] (signed)     12/29/2009: [facility] -- T-Comprehensive Metabolic Panel [[id number]] (signed)     12/29/2009: [facility] -- T-TSH [phone] (signed)    Prevention & Chronic Care Immunizations   Influenza vaccine: Not documented    Tetanus booster: Not documented    Pneumococcal vaccine: Not documented  Colorectal Screening   Hemoccult: Not documented    Colonoscopy: Not documented  Other Screening   Pap smear: Not documented    Mammogram: Not documented   Mammogram action/deferral: Ordered  (12/29/2009)   Smoking status: quit  (12/29/2009)  Diabetes Mellitus   HgbA1C: 6.6  (12/29/2009)    Eye exam: Not documented   Diabetic eye exam action/deferral: Ophthalmology referral  (12/29/2009)    Foot exam: Not documented   Foot exam action/deferral: Do today   High risk foot: Not documented  Foot care education: Not documented    Urine microalbumin/creatinine ratio: Not documented   Urine microalbumin action/deferral: Ordered    Diabetes flowsheet reviewed?: Yes   Progress toward A1C goal: At goal  Lipids   Total Cholesterol: 200  (02/21/2009)   Lipid panel action/deferral: Lipid Panel ordered   LDL: 110  (02/21/2009)   LDL  Direct: Not documented   HDL: 57  (02/21/2009)   Triglycerides: 166  (02/21/2009)    SGOT (AST): 14  (07/26/2009)   SGPT (ALT): 10  (07/26/2009) CMP ordered    Alkaline phosphatase: 73  (07/26/2009)   Total bilirubin: 0.5  (07/26/2009)    Lipid flowsheet reviewed?: Yes   Progress toward LDL goal: Unchanged  Hypertension   Last Blood Pressure: 156 / 83  (12/29/2009)   Serum creatinine: 0.74  (07/26/2009)   Serum potassium 4.3  (07/26/2009) CMP ordered     Hypertension flowsheet reviewed?: Yes   Progress toward BP goal: Improved  Self-Management Support :   Personal Goals (by the next clinic visit) :     Personal A1C goal: 7  (12/29/2009)     Personal blood pressure goal: 130/80  (12/29/2009)     Personal LDL goal: 100  (12/29/2009)    Patient will work on the following items until the next clinic visit to reach self-care goals:     Medications and monitoring: take my medicines every day, check my blood sugar, bring all of my medications to every visit, examine my feet every day  (12/29/2009)     Eating: drink diet soda or water instead of juice or soda, eat more vegetables, use fresh or frozen vegetables, eat foods that are low in salt, eat baked foods instead of fried foods, eat fruit for snacks and desserts, limit or avoid alcohol  (12/29/2009)    Diabetes self-management support: Written self-care plan  (12/29/2009)   Diabetes care plan printed   Last diabetes self-management training by diabetes educator: 03/08/2009    Hypertension self-management support: Written self-care plan  (12/29/2009)   Hypertension self-care plan printed.    Lipid self-management support: Written self-care plan  (12/29/2009)   Lipid self-care plan printed.   Nursing Instructions: Schedule screening mammogram (see order) Refer for screening diabetic eye exam (see order) Diabetic foot exam today   Laboratory Results   Blood Tests   Date/Time Received: December 29, 2009 11:09  AM Date/Time Reported: Alric Quan  December 29, 2009 11:09 AM  HGBA1C: 6.6%   (Normal Range: Non-Diabetic - 3-6%   Control Diabetic - 6-8%) CBG Fasting:: 123mg /dL

## 2010-12-19 NOTE — Assessment & Plan Note (Signed)
Summary: EST-F/U VISIT/CH   Vital Signs:  Patient profile:   55 year old female Height:      67 inches Weight:      255.05 pounds Temp:     99.3 degrees F oral Pulse rate:   74 / minute BP sitting:   143 / 89  (right arm) Cuff size:   large  Vitals Entered By: Angelina Ok RN (August 24, 2010 2:56 PM) CC: Depression Is Patient Diabetic? Yes Did you bring your meter with you today? No Pain Assessment Patient in pain? yes     Location: hands Intensity: 7 Type: aching Onset of pain  Constant Nutritional Status BMI of > 30 = obese CBG Result 131  Have you ever been in a relationship where you felt threatened, hurt or afraid?No   Does patient need assistance? Functional Status Self care Ambulation Normal Comments Pain in both hands.  Went to Neurology for testing.  Report was given to pt to bring to her doctor.  Pt said thatshe is having some depression is uanable to raech the [facility] where she has been seeing a nurse there.  Call to [facility] for pt to schedule an appointment.   Primary Care Provider:  Laren Everts MD  CC:  Depression.  History of Present Illness: 55 yr old woman with pmhx as described below comes to the clinic for follow up. Patient reports that since she was discharged she stays sleepy and tired. Reports to have no energy. Continues to feel depressed. Denies homocidal or suicidal ideation.  Patient has had to take a couple of as needed lasix because of increased swelling on her legs.   Patient reports that she has Carpal tunnel in both hands based on recent exam done.   Depression History:      The patient is having a depressed mood most of the day and has a diminished interest in her usual daily activities.        The patient denies that she feels like life is not worth living, denies that she wishes that she were dead, and denies that she has thought about ending her life.        Comments:  Lack of energy.   Preventive  Screening-Counseling & Management  Alcohol-Tobacco     Alcohol drinks/day: 0     Smoking Status: quit     Smoking Cessation Counseling: yes     Year Started: occasional  Problems Prior to Update: 1)  Sx of Carpal Tunnel Syndrome  (ICD-354.0) 2)  ? of Sleep Apnea, Obstructive  (ICD-327.23) 3)  ? 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 Take 1 Tablet By Mouth Once A Day 2)  Nitro-Dur 0.4 Mg/hr  Pt24 (Nitroglycerin) .... Dissolve One Tablet Under The Tongue Every 5 Minutes As Needed For Chest Pain. If More Than 2 Doses Needed, Call 911. 3)  Simvastatin 40 Mg  Tabs (Simvastatin) .... Take 1 Tablet By Mouth Once A Day 4)  Trazodone Hcl 50 Mg Tabs (Trazodone Hcl) .... 1/2 Tab By Mouth At Bedtime As  Needed Insomnia 5)  Sertraline Hcl 50 Mg Tabs (Sertraline Hcl) .... Take 1 Tablet By Mouth Once A Day 6)  Naproxen 500 Mg Tabs (Naproxen) .... Take 1 Tablet By Mouth Two Times A Day As Needed For Pain 7)  Carvedilol 6.25 Mg Tabs (Carvedilol) .... Take 1 Tablet By Mouth Two Times A Day 8)  Aspirin 81 Mg Tabs (Aspirin) .... Take 1 Tablet By Mouth Once A Day 9)  Acetaminophen 500 Mg Tabs (Acetaminophen) .... Take 1 Tablet By Mouth Every 6 Hours As Needed For Pain 10)  Furosemide 20 Mg Tabs (Furosemide) .... Take 1 Tablet By Mouth Daily If You Weigh Yourself and Your Weight Has Increased By 3 Pounds or More  Current Medications (verified): 1)  Lisinopril 40 Mg Tabs (Lisinopril) .... Take 1 Tablet By Mouth Once A Day 2)   Nitro-Dur 0.4 Mg/hr  Pt24 (Nitroglycerin) .... Dissolve One Tablet Under The Tongue Every 5 Minutes As Needed For Chest Pain. If More Than 2 Doses Needed, Call 911. 3)  Simvastatin 40 Mg  Tabs (Simvastatin) .... Take 1 Tablet By Mouth Once A Day 4)  Trazodone Hcl 50 Mg Tabs (Trazodone Hcl) .... 1/2 Tab By Mouth At Bedtime As Needed Insomnia 5)  Sertraline Hcl 50 Mg Tabs (Sertraline Hcl) .... Take 1 Tablet By Mouth Once A Day 6)  Naproxen 500 Mg Tabs (Naproxen) .... Take 1 Tablet By Mouth Two Times A Day As Needed For Pain 7)  Carvedilol 6.25 Mg Tabs (Carvedilol) .... Take 1 Tablet By Mouth Two Times A Day 8)  Aspirin 81 Mg Tabs (Aspirin) .... Take 1 Tablet By Mouth Once A Day 9)  Acetaminophen 500 Mg Tabs (Acetaminophen) .... Take 1 Tablet By Mouth Every 6 Hours As Needed For Pain 10)  Furosemide 20 Mg Tabs (Furosemide) .... Take 1 Tablet By Mouth Daily If You Weigh Yourself and Your Weight Has Increased By 3 Pounds or More  Allergies: No Known Drug Allergies  Past History:  Social History: Last updated: 09/27/2008 Drug use-yes in past denies smoking and drug use currently.  Risk Factors: Alcohol Use: 0 (08/24/2010) Exercise: no (07/19/2010)  Risk Factors: Smoking Status: quit (08/24/2010)  Past Medical History: Congestive heart failure sec to nonischemic cardiomyopathy 2D echo 07/2010>> Systolic function was normal. The estimated ejection fraction was in the range of 55% to 60% Depression Diabetes mellitus, type II Hypertension h/o Cocaine use  Social History: Reviewed history from 09/27/2008 and no changes required. Drug use-yes in past denies smoking and drug use currently.  Review of Systems       The patient complains of difficulty walking.  The patient denies fever, dyspnea on exertion, hemoptysis, melena, hematochezia, and hematuria.    Physical Exam  General:  overweight-appearing.  NAD Mouth:  pharynx pink and moist and fair dentition.   Neck:  supple and no  masses.   Lungs:  Normal respiratory effort, chest expands symmetrically.  no crackles or wheezes. Heart:  Normal rate and regular rhythm. S1 and S2 normal without gallop, murmur, click, rub or other extra sounds. Abdomen:  soft, non-tender, and normal bowel sounds.   Msk:  Positive Tinel and Phalanes sign Extremities:  no edema Neurologic:  alert & oriented X3, cranial nerves II-XII intact, and gait normal.     Impression & Recommendations:  Problem # 1:  Sx of CARPAL TUNNEL SYNDROME (ICD-354.0) Treat conservatively. Patient will be started on neurontin for pain. If symptoms worsen will referr to Orthopedis for further mangment.  Labs Reviewed:  TSH: 0.293 (04/27/2010)   HgBA1c: 6.7 (07/19/2010)  Problem # 2:  ? of SLEEP APNEA, OBSTRUCTIVE (ICD-327.23) Patient feels tired and sleepy all the time. She snores. Will order sleep study and reevaluate.  Orders: Sleep Disorder Referral (Sleep Disorder)  Problem # 3:  HYPERTENSION (ICD-401.9) Improved but still above goal. Will consider adding norvasc on follow up. Instructed to decrease salt intake.  Her updated medication list for this problem includes:    Lisinopril 40 Mg Tabs (Lisinopril) .[name]... Take 1 tablet by mouth once a day    Carvedilol 6.25 Mg Tabs (Carvedilol) .[name]... Take 1 tablet by mouth two times a day    Furosemide 20 Mg Tabs (Furosemide) .[name]... Take 1 tablet by mouth daily if you weigh yourself and your weight has increased by 3 pounds or more  BP today: 143/89 Prior BP: 155/89 (07/19/2010)  Labs Reviewed: K+: 4.8 (12/30/2009) Creat: : 0.73 (12/30/2009)   Chol: 196 (12/30/2009)   HDL: 70 (12/30/2009)   LDL: 105 (12/30/2009)   TG: 106 (12/30/2009)  Problem # 4:  DIABETES MELLITUS, TYPE II (ICD-250.00) Continue diet and exercise.  Her updated medication list for this problem includes:    Lisinopril 40 Mg Tabs (Lisinopril) .[name]... Take 1 tablet by mouth once a day    Aspirin 81 Mg Tabs (Aspirin) .[name]... Take 1 tablet by  mouth once a day  Orders: Capillary Blood Glucose/CBG (47829)  Labs Reviewed: Creat: 0.73 (12/30/2009)    Reviewed HgBA1c results: 6.7 (07/19/2010)  6.5 (04/27/2010)  Problem # 5:  CONGESTIVE HEART FAILURE (ICD-428.0) No signs of being volume overlaoded. Will have her continue current regimen. Wll review renal funciton.  Her updated medication list for this problem includes:    Lisinopril 40 Mg Tabs (Lisinopril) .[name]... Take 1 tablet by mouth once a day    Carvedilol 6.25 Mg Tabs (Carvedilol) .[name]... Take 1 tablet by mouth two times a day    Aspirin 81 Mg Tabs (Aspirin) .[name]... Take 1 tablet by mouth once a day    Furosemide 20 Mg Tabs (Furosemide) .[name]... Take 1 tablet by mouth daily if you weigh yourself and your weight has increased by 3 pounds or more  Orders: T-Basic Metabolic Panel [phone])  Problem # 6:  DEPRESSION (ICD-311) No SSI/HSI. On follow up will increase Zoloft.  Her updated medication list for this problem includes:    Trazodone Hcl 50 Mg Tabs (Trazodone hcl) .[name]... 1/2 tab by mouth at bedtime as needed insomnia    Sertraline Hcl 50 Mg Tabs (Sertraline hcl) .[name]... Take 1 tablet by mouth once a day  Complete Medication List: 1)  Lisinopril 40 Mg Tabs (Lisinopril) .... Take 1 tablet by mouth once a day 2)  Nitro-dur 0.4 Mg/hr Pt24 (Nitroglycerin) .... Dissolve one tablet under the tongue every 5 minutes as needed for chest pain. if more than 2 doses needed, call 911. 3)  Simvastatin 40 Mg Tabs (Simvastatin) .... Take 1 tablet by mouth once a day 4)  Trazodone Hcl 50 Mg Tabs (Trazodone hcl) .... 1/2 tab by mouth at bedtime as needed insomnia 5)  Sertraline Hcl 50 Mg Tabs (Sertraline hcl) .... Take 1 tablet by mouth once a day 6)  Naproxen 500 Mg Tabs (Naproxen) .... Take 1 tablet by mouth two times a day as needed for pain 7)  Carvedilol 6.25 Mg Tabs (Carvedilol) .... Take 1 tablet by mouth two times a day 8)  Aspirin 81 Mg Tabs (Aspirin) .... Take 1 tablet by mouth once  a day  9)  Acetaminophen 500 Mg Tabs (Acetaminophen) .... Take 1 tablet by mouth every 6 hours as needed for pain 10)  Furosemide 20 Mg Tabs (Furosemide) .... Take 1 tablet by mouth daily if you weigh yourself and your weight has increased by 3 pounds or more 11)  Neurontin 300 Mg Caps (Gabapentin) .... Take 1 tablet by mouth three times a day  Patient Instructions: 1)  Please schedule a follow-up appointment in 1 month. 2)  Take all medication as directed. 3)  You will be called with any abnormalities in the tests scheduled or performed today.  If you don't hear from [place] within a week from when the test was performed, you can assume that your test was normal.  Prescriptions: NEURONTIN 300 MG CAPS (GABAPENTIN) Take 1 tablet by mouth three times a day  #90 x 1   Entered and Authorized by:   Laren Everts MD   Signed by:   Laren Everts MD on 08/24/2010   Method used:   [facility] to        [facility]  [address] (retail)       [address]       [place], [place]  [address]       Ph: [phone] or [phone]       Fax: [phone]   RxID:   [id number] NEURONTIN 300 MG CAPS (GABAPENTIN) Take 1 tablet by mouth three times a day  #90 x 1   Entered and Authorized by:   Laren Everts MD   Signed by:   Laren Everts MD on 08/24/2010   Method used:   Faxed to ...       [facility] (retail)       [address]       [place], [place]  [address]       Ph: [phone]       Fax: [phone]   RxID:   [id number]   Prevention & Chronic Care Immunizations   Influenza vaccine: Not documented    Tetanus booster: Not documented    Pneumococcal vaccine: Not documented  Colorectal Screening   Hemoccult: Not documented    Colonoscopy: Not documented   Colonoscopy action/deferral: GI referral  (04/27/2010)  Other Screening   Pap smear: Not documented    Mammogram: ASSESSMENT: Negative - BI-RADS  1^MM DIGITAL SCREENING  (05/30/2010)   Mammogram action/deferral: Ordered  (04/27/2010)   Smoking status: quit  (08/24/2010)  Diabetes Mellitus   HgbA1C: 6.7  (07/19/2010)    Eye exam: Not documented   Diabetic eye exam action/deferral: Ophthalmology referral  (12/29/2009)    Foot exam: yes  (07/19/2010)   Foot exam action/deferral: Do today   High risk foot: Not documented   Foot care education: Not documented    Urine microalbumin/creatinine ratio: Not documented   Urine microalbumin action/deferral: Ordered    Diabetes flowsheet reviewed?: Yes   Progress toward A1C goal: At goal  Lipids   Total Cholesterol: 196  (12/30/2009)   Lipid panel action/deferral: Lipid Panel ordered   LDL: 105  (12/30/2009)   LDL Direct: Not documented   HDL: 70  (12/30/2009)   Triglycerides: 106  (12/30/2009)    SGOT (AST): 13  (12/30/2009)   SGPT (ALT): 8  (12/30/2009)   Alkaline phosphatase: 58  (12/30/2009)   Total bilirubin: 0.4  (12/30/2009)    Lipid flowsheet reviewed?: Yes   Progress toward LDL goal: Unchanged  Hypertension   Last Blood Pressure: 143 / 89  (  08/24/2010)   Serum creatinine: 0.73  (12/30/2009)   Serum potassium 4.8  (12/30/2009)    Hypertension flowsheet reviewed?: Yes   Progress toward BP goal: Improved  Self-Management Support :   Personal Goals (by the next clinic visit) :     Personal A1C goal: 7  (12/29/2009)     Personal blood pressure goal: 130/80  (12/29/2009)     Personal LDL goal: 100  (12/29/2009)    Patient will work on the following items until the next clinic visit to reach self-care goals:     Medications and monitoring: take my medicines every day, bring all of my medications to every visit, examine my feet every day  (08/24/2010)     Eating: drink diet soda or water instead of juice or soda, eat more vegetables, use fresh or frozen vegetables, eat foods that are low in salt, eat baked foods instead of fried foods, eat fruit for snacks and  desserts, limit or avoid alcohol  (08/24/2010)     Activity: take a 30 minute walk every day  (08/24/2010)    Diabetes self-management support: Written self-care plan, Education handout, Pre-printed educational material, Resources for patients handout  (08/24/2010)   Diabetes care plan printed   Diabetes education handout printed   Last diabetes self-management training by diabetes educator: 03/08/2009    Hypertension self-management support: Written self-care plan, Education handout, Pre-printed educational material, Resources for patients handout  (08/24/2010)   Hypertension self-care plan printed.   Hypertension education handout printed    Lipid self-management support: Written self-care plan, Education handout, Pre-printed educational material, Resources for patients handout  (08/24/2010)   Lipid self-care plan printed.   Lipid education handout printed      Resource handout printed.   Process Orders Check Orders Results:     [facility]: ABN not required for this insurance Tests Sent for requisitioning (August 27, 2010 11:36 AM):     08/24/2010: [facility] -- T-Basic Metabolic Panel [phone] (signed)

## 2010-12-19 NOTE — Progress Notes (Signed)
Summary: [facility]/Copay Assist  Phone Note Outgoing Call   Call placed by: Soc. Work Call placed to: Patient Summary of Call: Called patient to let her know that she could pick up her prescriptions after 1:30 today at the [facility]  Copay would be 40.00 which we agreed to pay for.      Appended Document: [facility] Hand delivered $40 copay to [place] at [facility]. on Tuesday  April 12th.

## 2010-12-19 NOTE — Progress Notes (Signed)
  Phone Note Outgoing Call   Summary of Call: Chrys Racer at [facility]  [phone] to assist patient with her medication refills and then call me with copay amount so I can reimburse.

## 2010-12-19 NOTE — Progress Notes (Signed)
  Phone Note Outgoing Call   Call placed by: Theotis Barrio NT II,  March 15, 2010 12:08 PM Call placed to: Patient Details for Reason: MAMMOGRAM Summary of Call: CALLED PATIENT (NOT HOME) LEFT VOICE  MESSAGE / MAMMOGRAM SCHEDULED FOR MAY 10, 011 @ 11:00 AM. TO CALL THEIR OFFICE AT [phone] IS SHE NEEDS TO CHANGE THIS APPT. Lela Sturdivant NT II  March 15, 2010 12:10 PM

## 2010-12-21 NOTE — Progress Notes (Signed)
Summary: med refill/gp  Phone Note Refill Request Message from:  Fax from Pharmacy on November 28, 2010 11:25 AM  Refills Requested: Medication #1:  NEURONTIN 300 MG CAPS Take 1 tablet by mouth three times a day.   Last Refilled: 10/15/2010 Last 08/24/10.   Method Requested: Electronic Initial call taken by: Chinita Pester RN,  November 28, 2010 11:26 AM  Follow-up for Phone Call        Refilled electronically.  Follow-up by: Margarito Liner MD,  November 28, 2010 11:40 AM    Prescriptions: NEURONTIN 300 MG CAPS (GABAPENTIN) Take 1 tablet by mouth three times a day  #90 x 1   Entered and Authorized by:   Margarito Liner MD   Signed by:   Margarito Liner MD on 11/28/2010   Method used:   [facility]  [address] [facility]       [address]       [place], [place]  [address]       Ph: [phone] or [phone]       Fax: [phone]   RxID:   [id number]

## 2010-12-21 NOTE — Assessment & Plan Note (Signed)
Summary: ACUTE-KNEE PAIN/URINE PROBLEMS(VEGA)/CFB   Vital Signs:  Patient profile:   55 year old female Height:      67 inches Weight:      279.8 pounds BMI:     43.98 Temp:     99.4 degrees F oral Pulse rate:   64 / minute BP sitting:   164 / 87  (right arm)  Vitals Entered By: Filomena Jungling NT II (December 11, 2010 9:54 AM) CC: KNEES AND SHOULDERS Is Patient Diabetic? Yes Did you bring your meter with you today? No Pain Assessment Patient in pain? yes     Location: KNEES,SHOULDERS Intensity: 10 Type: aching Onset of pain  Constant Nutritional Status BMI of > 30 = obese  Have you ever been in a relationship where you felt threatened, hurt or afraid?No   Does patient need assistance? Functional Status Self care Ambulation Normal   Primary Care Provider:  Laren Everts MD  CC:  KNEES AND SHOULDERS.  History of Present Illness: 55 y/o w with HTN, DM, HLD, OSA, CHF and non ischemic cardiomyopathy, Carpal tunnel syndrome comes to the clinic c/o  knee pain - b/l, but more on the left knee. mostly on medial side of both knees - has been there since a 'long time' - started out as slight ache and has progressed to a point when she is unable to stand at times. feels bones pop at some time - worse with wt bearing -has been taking neurontin, naproxen and otc BC powder but has not helped her knee pain at all - no swelling, redness or injury to knees.  - radiating to hips - worse in morning for about 30 minutes  Preventive Screening-Counseling & Management  Alcohol-Tobacco     Alcohol drinks/day: 0     Smoking Status: quit     Smoking Cessation Counseling: yes     Year Started: occasional  Caffeine-Diet-Exercise     Does Patient Exercise: no  Current Medications (verified): 1)  Lisinopril 40 Mg Tabs (Lisinopril) .... Take 1 Tablet By Mouth Once A Day 2)  Nitro-Dur 0.4 Mg/hr  Pt24 (Nitroglycerin) .... Dissolve One Tablet Under The Tongue Every 5 Minutes As  Needed For Chest Pain. If More Than 2 Doses Needed, Call 911. 3)  Simvastatin 40 Mg  Tabs (Simvastatin) .... Take 1 Tablet By Mouth Once A Day 4)  Trazodone Hcl 50 Mg Tabs (Trazodone Hcl) .... 1/2 Tab By Mouth At Bedtime As Needed Insomnia 5)  Sertraline Hcl 50 Mg Tabs (Sertraline Hcl) .... Take 1 Tablet By Mouth Once A Day 6)  Naproxen 500 Mg Tabs (Naproxen) .... Take 1 Tablet By Mouth Two Times A Day As Needed For Pain 7)  Carvedilol 6.25 Mg Tabs (Carvedilol) .... Take 1 Tablet By Mouth Two Times A Day 8)  Aspirin 81 Mg Tabs (Aspirin) .... Take 1 Tablet By Mouth Once A Day 9)  Acetaminophen 500 Mg Tabs (Acetaminophen) .... Take 1 Tablet By Mouth Every 6 Hours As Needed For Pain 10)  Furosemide 20 Mg Tabs (Furosemide) .... Take 1 Tablet By Mouth Daily If You Weigh Yourself and Your Weight Has Increased By 3 Pounds or More 11)  Neurontin 300 Mg Caps (Gabapentin) .... Take 1 Tablet By Mouth Three Times A Day  Allergies (verified): No Known Drug Allergies  Review of Systems       The patient complains of dyspnea on exertion and difficulty walking.  The patient denies anorexia, fever, weight loss, weight gain, vision loss, decreased hearing,  hoarseness, chest pain, syncope, peripheral edema, prolonged cough, headaches, hemoptysis, abdominal pain, melena, hematochezia, severe indigestion/heartburn, hematuria, incontinence, genital sores, muscle weakness, suspicious skin lesions, transient blindness, depression, unusual weight change, abnormal bleeding, enlarged lymph nodes, angioedema, breast masses, and testicular masses.    Physical Exam  General:  Gen: VS reveiwed, Alert, well developed, nodistress ENT: mucous membranes pink & moist. No abnormal finds in ear and nose. CVC:S1 S2 , no murmurs, no abnormal heart sounds. Lungs: Clear to auscultation B/L. No wheezes, crackles or other abnormal sounds Abdomen: soft, non distended, no tender. Normal Bowel sounds EXT: no pitting edema, no engorged  veins, Pulsations normal  Neuro:alert, oriented *3, cranial nerved 2-12 intact, strenght normal in all  extremities, senstations normal to light touch.    Extremities:  trace left pedal edema and trace right pedal edema. both knee tender to deep palpation, no bony abnormality, no swelling or redess, no locking or hyperflexibilty.     Impression & Recommendations:  Problem # 1:  KNEE PAIN, BILATERAL (ICD-719.46) b/l knee pain since a long time, stiffness in the morning, knee pain on the medial side of knees, no other alarming signs on physical exam dd- most likely arthritis  plan - Xray b/l knees - tramadol for pain along with neurontin and otc tylenol.  - advised not to take ibuprofen, BC powed and other NSAIDs for now - Also advised PT, sports medicine referral for cortisone injectin and ortho referral for dicussin regarding future need for knee replacement but refuses at this time. - follow up in 1 month to see if pain is controlled with tramadol and if patient more acceptable to other definitive forms of treatment  I have a reviewed the previous records including hospital and ED records, radiographs and lab values. Spent more than 35 minutes discussing the importance of medication compliance, managment of chronic diseases and lifestyle changes with the patient.    Her updated medication list for this problem includes:    Naproxen 500 Mg Tabs (Naproxen) .[name]... Take 1 tablet by mouth two times a day as needed for pain    Aspirin 81 Mg Tabs (Aspirin) .[name]... Take 1 tablet by mouth once a day    Acetaminophen 500 Mg Tabs (Acetaminophen) .[name]... Take 1 tablet by mouth every 6 hours as needed for pain    Tramadol Hcl 50 Mg Tabs (Tramadol hcl) .[name]... 1 tablet every 6 hrs as needed for pain  Orders: T-DG Knee 4 Views Bilat (04540)  Complete Medication List: 1)  Lisinopril 40 Mg Tabs (Lisinopril) .... Take 1 tablet by mouth once a day 2)  Nitro-dur 0.4 Mg/hr Pt24 (Nitroglycerin) .... Dissolve one  tablet under the tongue every 5 minutes as needed for chest pain. if more than 2 doses needed, call 911. 3)  Simvastatin 40 Mg Tabs (Simvastatin) .... Take 1 tablet by mouth once a day 4)  Trazodone Hcl 50 Mg Tabs (Trazodone hcl) .... 1/2 tab by mouth at bedtime as needed insomnia 5)  Sertraline Hcl 50 Mg Tabs (Sertraline hcl) .... Take 1 tablet by mouth once a day 6)  Naproxen 500 Mg Tabs (Naproxen) .... Take 1 tablet by mouth two times a day as needed for pain 7)  Carvedilol 6.25 Mg Tabs (Carvedilol) .... Take 1 tablet by mouth two times a day 8)  Aspirin 81 Mg Tabs (Aspirin) .... Take 1 tablet by mouth once a day 9)  Acetaminophen 500 Mg Tabs (Acetaminophen) .... Take 1 tablet by mouth every 6 hours as needed  for pain 10)  Furosemide 20 Mg Tabs (Furosemide) .... Take 1 tablet by mouth daily if you weigh yourself and your weight has increased by 3 pounds or more 11)  Neurontin 300 Mg Caps (Gabapentin) .... Take 1 tablet by mouth three times a day 12)  Tramadol Hcl 50 Mg Tabs (Tramadol hcl) .[name].. 1 tablet every 6 hrs as needed for pain  Other Orders: T- Capillary Blood Glucose (82948) T-Hgb A1C (in-house) (69629BM)  Patient Instructions: 1)  Please schedule a follow-up appointment in 1 month. Prescriptions: TRAMADOL HCL 50 MG TABS (TRAMADOL HCL) 1 tablet every 6 hrs as needed for pain  #90 x 1   Entered and Authorized by:   Bethel Born MD   Signed by:   Bethel Born MD on 12/11/2010   Method used:   Electronically to        [facility] [id number] (retail)       [address]       [place], [place]  [address]       Ph: [phone] or [phone]       Fax: [phone]   RxID:   [id number]    Orders Added: 1)  T- Capillary Blood Glucose [82948] 2)  T-Hgb A1C (in-house) [83036QW] 3)  T-DG Knee 4 Views Bilat [73564] 4)  Est. Patient Level IV [09323]    Prevention & Chronic Care Immunizations   Influenza vaccine: Not documented    Tetanus booster: Not  documented    Pneumococcal vaccine: Not documented  Colorectal Screening   Hemoccult: Not documented    Colonoscopy: Not documented   Colonoscopy action/deferral: GI referral  (04/27/2010)  Other Screening   Pap smear: Not documented    Mammogram: ASSESSMENT: Negative - BI-RADS 1^MM DIGITAL SCREENING  (05/30/2010)   Mammogram action/deferral: Ordered  (04/27/2010)   Smoking status: quit  (12/11/2010)  Diabetes Mellitus   HgbA1C: 7.5  (12/11/2010)    Eye exam: Not documented   Diabetic eye exam action/deferral: Ophthalmology referral  (12/29/2009)    Foot exam: yes  (07/19/2010)   Foot exam action/deferral: Do today   High risk foot: Not documented   Foot care education: Not documented    Urine microalbumin/creatinine ratio: Not documented   Urine microalbumin action/deferral: Ordered  Lipids   Total Cholesterol: 196  (12/30/2009)   Lipid panel action/deferral: Lipid Panel ordered   LDL: 105  (12/30/2009)   LDL Direct: Not documented   HDL: 70  (12/30/2009)   Triglycerides: 106  (12/30/2009)    SGOT (AST): 13  (12/30/2009)   SGPT (ALT): 8  (12/30/2009)   Alkaline phosphatase: 58  (12/30/2009)   Total bilirubin: 0.4  (12/30/2009)  Hypertension   Last Blood Pressure: 164 / 87  (12/11/2010)   Serum creatinine: 0.85  (08/24/2010)   Serum potassium 4.6  (08/24/2010)  Self-Management Support :   Personal Goals (by the next clinic visit) :     Personal A1C goal: 7  (12/29/2009)     Personal blood pressure goal: 130/80  (12/29/2009)     Personal LDL goal: 100  (12/29/2009)    Patient will work on the following items until the next clinic visit to reach self-care goals:     Medications and monitoring: take my medicines every day, bring all of my medications to every visit, examine my feet every day  (12/11/2010)     Eating: drink diet soda or water instead of juice or soda, eat more vegetables, use fresh or frozen vegetables, eat foods that  are low in salt, eat baked  foods instead of fried foods, eat fruit for snacks and desserts  (12/11/2010)     Activity: take a 30 minute walk every day  (12/11/2010)    Diabetes self-management support: Written self-care plan, Education handout, Pre-printed educational material, Resources for patients handout  (08/24/2010)   Last diabetes self-management training by diabetes educator: 03/08/2009    Hypertension self-management support: Written self-care plan, Education handout, Pre-printed educational material, Resources for patients handout  (08/24/2010)    Lipid self-management support: Written self-care plan, Education handout, Pre-printed educational material, Resources for patients handout  (08/24/2010)    Laboratory Results   Blood Tests   Date/Time Received: December 11, 2010 10:21 AM Date/Time Reported: Alric Quan  December 11, 2010 10:21 AM   HGBA1C: 7.5%   (Normal Range: Non-Diabetic - 3-6%   Control Diabetic - 6-8%) CBG Fasting:: 225mg /dL

## 2011-01-19 ENCOUNTER — Other Ambulatory Visit: Payer: Self-pay | Admitting: Internal Medicine

## 2011-01-19 ENCOUNTER — Encounter: Payer: Self-pay | Admitting: Internal Medicine

## 2011-01-19 ENCOUNTER — Ambulatory Visit (INDEPENDENT_AMBULATORY_CARE_PROVIDER_SITE_OTHER): Payer: Medicare Other | Admitting: Internal Medicine

## 2011-01-19 DIAGNOSIS — E119 Type 2 diabetes mellitus without complications: Secondary | ICD-10-CM

## 2011-01-19 DIAGNOSIS — I509 Heart failure, unspecified: Secondary | ICD-10-CM

## 2011-01-19 DIAGNOSIS — C189 Malignant neoplasm of colon, unspecified: Secondary | ICD-10-CM

## 2011-01-19 DIAGNOSIS — I10 Essential (primary) hypertension: Secondary | ICD-10-CM

## 2011-01-19 DIAGNOSIS — E785 Hyperlipidemia, unspecified: Secondary | ICD-10-CM

## 2011-01-19 DIAGNOSIS — M25569 Pain in unspecified knee: Secondary | ICD-10-CM

## 2011-01-19 DIAGNOSIS — F329 Major depressive disorder, single episode, unspecified: Secondary | ICD-10-CM

## 2011-01-19 MED ORDER — AMLODIPINE BESYLATE 5 MG PO TABS: 5.0000 mg | ORAL_TABLET | Freq: Every day | ORAL | Status: DC

## 2011-01-19 MED ORDER — FUROSEMIDE 20 MG PO TABS: 20.0000 mg | ORAL_TABLET | Freq: Every day | ORAL | Status: DC

## 2011-01-19 MED ORDER — METFORMIN HCL 500 MG PO TABS: 500.0000 mg | ORAL_TABLET | Freq: Two times a day (BID) | ORAL | Status: DC

## 2011-01-19 NOTE — Progress Notes (Signed)
  Subjective:    Patient ID: Debra Graham, female    DOB: October 06, 1956, 55 y.o.   MRN: [id number]  HPI  55 yr old woman with  Past Medical History  Diagnosis Date  . Osteoarthritis   . Carpal tunnel syndrome   . Depression   . Diabetes mellitus   . Peripheral neuropathy   . Hyperlipidemia   . History of cocaine abuse   . Hypertension   . Adenocarcinoma, colon 11/09    s/p subtotal colectomy with primary anastamosis by Dr. Johna Sheriff; Chemotherapy per Dr. Twanna Hy  . CHF (congestive heart failure)     Cath 12/2005>>Nonischemic cardiomyopathy with mild coronary irregularities. done  by Dr. Margarita Grizzle to the clinic for follow up of bilateral knee pain. Patient reports that she has responded well to treatment.   Adenocarcinoma Colon: Patient has an appointment Dr. Gaylyn Rong (Oncologist)  on 01/24/2011. Patient reports to have had a colonoscopy last year.   CHF: Patient has not been taking lasix.  Review of Systems  Constitutional: Negative.   HENT: Negative.   Respiratory: Negative.   Cardiovascular: Negative.   Gastrointestinal: Positive for abdominal distention.  Genitourinary: Negative.   Musculoskeletal: Negative.   Neurological: Negative.   Hematological: Negative.   Psychiatric/Behavioral: Negative.        Objective:   Physical Exam  Constitutional: She is oriented to person, place, and time.       Obese  HENT:  Mouth/Throat: Oropharynx is clear and moist.  Eyes: Conjunctivae and EOM are normal. Pupils are equal, round, and reactive to light.  Neck: Normal range of motion. Neck supple.  Cardiovascular: Normal rate, regular rhythm and normal heart sounds.   Pulmonary/Chest: Effort normal and breath sounds normal.  Abdominal: Soft. Bowel sounds are normal.  Musculoskeletal: Normal range of motion.       Trace bilateral lower extremity edema noted  Neurological: She is alert and oriented to person, place, and time.  Psychiatric: She has a normal mood and affect.            Assessment & Plan:

## 2011-01-19 NOTE — Assessment & Plan Note (Signed)
Stable. No suicidal/homicidal ideation. Continue current regimen.

## 2011-01-19 NOTE — Assessment & Plan Note (Addendum)
Stable. Continue current regimen. Instructed not to take any other form of NSAIDs, such as goody powders,  while taking naproxen due to increased risk of PUD, or renal failure.

## 2011-01-19 NOTE — Assessment & Plan Note (Signed)
Uncontrolled. Will have patient restart taking Lasix 20mg  po daily along with coreg and lisinopril. Will start norvasc and recheck BP in two weeks.

## 2011-01-19 NOTE — Patient Instructions (Signed)
Make appointment in 2 weeks recheck blood pressure. Restart taking Lasix. Start taking Norvasc and Metformin as directed. You will be called with any abnormalities in the tests scheduled or performed today.  If you don't hear from [place] within a week from when the test was performed, you can assume that your test was normal.

## 2011-01-19 NOTE — Assessment & Plan Note (Signed)
Stable  Continue current regimen  

## 2011-01-19 NOTE — Assessment & Plan Note (Signed)
Stable. Will check lipid panel, cmet, and TSH.

## 2011-01-19 NOTE — Assessment & Plan Note (Signed)
Instructed to go to Oncology appointment for follow up. Patient will most likely need repeat Colonoscopy if not already done within the last year. Will order on follow up if not already done.

## 2011-01-19 NOTE — Assessment & Plan Note (Signed)
HgA1c was 7.5 last month. Patient only on diet control. Will have start metformin 500mg  po bid.

## 2011-01-20 LAB — COMPREHENSIVE METABOLIC PANEL
BUN: 13 mg/dL (ref 6–23)
CO2: 25 mEq/L (ref 19–32)
Creat: 0.69 mg/dL (ref 0.40–1.20)
Glucose, Bld: 232 mg/dL — ABNORMAL HIGH (ref 70–99)
Total Bilirubin: 0.4 mg/dL (ref 0.3–1.2)

## 2011-01-20 LAB — MICROALBUMIN / CREATININE URINE RATIO: Microalb, Ur: 331.17 mg/dL — ABNORMAL HIGH (ref 0.00–1.89)

## 2011-01-20 LAB — CBC
HCT: 40.9 % (ref 36.0–46.0)
Hemoglobin: 13.1 g/dL (ref 12.0–15.0)
MCH: 28.1 pg (ref 26.0–34.0)
MCV: 87.6 fL (ref 78.0–100.0)
RBC: 4.67 MIL/uL (ref 3.87–5.11)

## 2011-01-20 LAB — LIPID PANEL
Cholesterol: 220 mg/dL — ABNORMAL HIGH (ref 0–200)
Total CHOL/HDL Ratio: 3.1 Ratio
Triglycerides: 145 mg/dL (ref <150)
VLDL: 29 mg/dL (ref 0–40)

## 2011-01-24 ENCOUNTER — Encounter (HOSPITAL_BASED_OUTPATIENT_CLINIC_OR_DEPARTMENT_OTHER): Payer: Medicare Other | Admitting: Oncology

## 2011-01-24 ENCOUNTER — Other Ambulatory Visit: Payer: Self-pay | Admitting: Oncology

## 2011-01-24 DIAGNOSIS — D649 Anemia, unspecified: Secondary | ICD-10-CM

## 2011-01-24 DIAGNOSIS — D696 Thrombocytopenia, unspecified: Secondary | ICD-10-CM

## 2011-01-24 DIAGNOSIS — D709 Neutropenia, unspecified: Secondary | ICD-10-CM

## 2011-01-24 DIAGNOSIS — C186 Malignant neoplasm of descending colon: Secondary | ICD-10-CM

## 2011-01-24 DIAGNOSIS — C189 Malignant neoplasm of colon, unspecified: Secondary | ICD-10-CM

## 2011-01-24 LAB — COMPREHENSIVE METABOLIC PANEL
Alkaline Phosphatase: 86 U/L (ref 39–117)
Creatinine, Ser: 0.85 mg/dL (ref 0.40–1.20)
Glucose, Bld: 298 mg/dL — ABNORMAL HIGH (ref 70–99)
Sodium: 138 mEq/L (ref 135–145)
Total Bilirubin: 0.3 mg/dL (ref 0.3–1.2)
Total Protein: 6.5 g/dL (ref 6.0–8.3)

## 2011-01-24 LAB — CEA: CEA: 1.3 ng/mL (ref 0.0–5.0)

## 2011-01-24 LAB — CBC WITH DIFFERENTIAL/PLATELET
Eosinophils Absolute: 0 10*3/uL (ref 0.0–0.5)
HCT: 36.5 % (ref 34.8–46.6)
LYMPH%: 18.3 % (ref 14.0–49.7)
MCHC: 33.9 g/dL (ref 31.5–36.0)
MCV: 85.5 fL (ref 79.5–101.0)
MONO%: 5.1 % (ref 0.0–14.0)
NEUT%: 75.6 % (ref 38.4–76.8)
Platelets: 149 10*3/uL (ref 145–400)
RBC: 4.26 10*6/uL (ref 3.70–5.45)

## 2011-02-01 LAB — DIFFERENTIAL
Eosinophils Absolute: 0.1 10*3/uL (ref 0.0–0.7)
Eosinophils Relative: 1 % (ref 0–5)
Lymphocytes Relative: 31 % (ref 12–46)
Lymphs Abs: 1.6 10*3/uL (ref 0.7–4.0)
Monocytes Relative: 8 % (ref 3–12)

## 2011-02-01 LAB — PROTIME-INR: Prothrombin Time: 12.8 seconds (ref 11.6–15.2)

## 2011-02-01 LAB — BASIC METABOLIC PANEL
BUN: 24 mg/dL — ABNORMAL HIGH (ref 6–23)
BUN: 15 mg/dL (ref 6–23)
CO2: 23 mEq/L (ref 19–32)
Chloride: 103 mEq/L (ref 96–112)
Chloride: 112 mEq/L (ref 96–112)
Chloride: 114 mEq/L — ABNORMAL HIGH (ref 96–112)
Creatinine, Ser: 0.57 mg/dL (ref 0.4–1.2)
GFR calc Af Amer: >60 mL/min (ref >60)
GFR calc Af Amer: >60 mL/min (ref >60)
GFR calc Af Amer: >60 mL/min (ref >60)
GFR calc non Af Amer: >60 mL/min (ref >60)
GFR calc non Af Amer: >60 mL/min (ref >60)
Potassium: 3.7 mEq/L (ref 3.5–5.1)
Potassium: 3.6 mEq/L (ref 3.5–5.1)
Potassium: 4 mEq/L (ref 3.5–5.1)
Sodium: 138 mEq/L (ref 135–145)
Sodium: 142 mEq/L (ref 135–145)
Sodium: 143 mEq/L (ref 135–145)

## 2011-02-01 LAB — CBC
Hemoglobin: 13.1 g/dL (ref 12.0–15.0)
MCH: 29.9 pg (ref 26.0–34.0)
MCV: 88.1 fL (ref 78.0–100.0)
Platelets: 129 10*3/uL — ABNORMAL LOW (ref 150–400)
RBC: 4.38 MIL/uL (ref 3.87–5.11)
WBC: 5.4 10*3/uL (ref 4.0–10.5)

## 2011-02-01 LAB — T3: T3, Total: 133.5 ng/dl (ref 80.0–204.0)

## 2011-02-01 LAB — GLUCOSE, CAPILLARY
Glucose-Capillary: 106 mg/dL — ABNORMAL HIGH (ref 70–99)
Glucose-Capillary: 129 mg/dL — ABNORMAL HIGH (ref 70–99)
Glucose-Capillary: 131 mg/dL — ABNORMAL HIGH (ref 70–99)
Glucose-Capillary: 151 mg/dL — ABNORMAL HIGH (ref 70–99)
Glucose-Capillary: 185 mg/dL — ABNORMAL HIGH (ref 70–99)

## 2011-02-01 LAB — CARDIAC PANEL(CRET KIN+CKTOT+MB+TROPI)
CK, MB: 1.4 ng/mL (ref 0.3–4.0)
CK, MB: 1.5 ng/mL (ref 0.3–4.0)
Relative Index: INVALID (ref 0.0–2.5)
Relative Index: INVALID (ref 0.0–2.5)
Total CK: 53 U/L (ref 7–177)
Troponin I: 0.01 ng/mL (ref 0.00–0.06)
Troponin I: 0.02 ng/mL (ref 0.00–0.06)

## 2011-02-01 LAB — COMPREHENSIVE METABOLIC PANEL
CO2: 26 mEq/L (ref 19–32)
Calcium: 8.8 mg/dL (ref 8.4–10.5)
Creatinine, Ser: 0.93 mg/dL (ref 0.4–1.2)
GFR calc Af Amer: >60 mL/min (ref >60)
GFR calc non Af Amer: >60 mL/min (ref >60)
Glucose, Bld: 132 mg/dL — ABNORMAL HIGH (ref 70–99)
Sodium: 143 mEq/L (ref 135–145)
Total Protein: 6.3 g/dL (ref 6.0–8.3)

## 2011-02-01 LAB — CK TOTAL AND CKMB (NOT AT ARMC)
Relative Index: INVALID (ref 0.0–2.5)
Total CK: 62 U/L (ref 7–177)

## 2011-02-01 LAB — BRAIN NATRIURETIC PEPTIDE: Pro B Natriuretic peptide (BNP): 224 pg/mL — ABNORMAL HIGH (ref 0.0–100.0)

## 2011-02-01 LAB — LIPID PANEL
Total CHOL/HDL Ratio: 3.1 RATIO
VLDL: 31 mg/dL (ref 0–40)

## 2011-02-01 LAB — POCT CARDIAC MARKERS
Myoglobin, poc: 55.4 ng/mL (ref 12–200)
Troponin i, poc: <0.05 ng/mL (ref 0.00–0.09)

## 2011-02-01 LAB — TROPONIN I: Troponin I: 0.02 ng/mL (ref 0.00–0.06)

## 2011-02-01 LAB — APTT: aPTT: 27 seconds (ref 24–37)

## 2011-02-02 LAB — GLUCOSE, CAPILLARY: Glucose-Capillary: 98 mg/dL (ref 70–99)

## 2011-02-05 ENCOUNTER — Encounter: Payer: Self-pay | Admitting: Internal Medicine

## 2011-02-05 ENCOUNTER — Ambulatory Visit (INDEPENDENT_AMBULATORY_CARE_PROVIDER_SITE_OTHER): Payer: Medicare Other | Admitting: Internal Medicine

## 2011-02-05 DIAGNOSIS — E119 Type 2 diabetes mellitus without complications: Secondary | ICD-10-CM

## 2011-02-05 DIAGNOSIS — C189 Malignant neoplasm of colon, unspecified: Secondary | ICD-10-CM

## 2011-02-05 DIAGNOSIS — E669 Obesity, unspecified: Secondary | ICD-10-CM

## 2011-02-05 DIAGNOSIS — E785 Hyperlipidemia, unspecified: Secondary | ICD-10-CM

## 2011-02-05 DIAGNOSIS — I10 Essential (primary) hypertension: Secondary | ICD-10-CM

## 2011-02-05 LAB — GLUCOSE, CAPILLARY: Glucose-Capillary: 242 mg/dL — ABNORMAL HIGH (ref 70–99)

## 2011-02-05 MED ORDER — AMLODIPINE BESYLATE 10 MG PO TABS: 10.0000 mg | ORAL_TABLET | Freq: Every day | ORAL | Status: DC

## 2011-02-05 MED ORDER — ATORVASTATIN CALCIUM 40 MG PO TABS: 40.0000 mg | ORAL_TABLET | Freq: Every day | ORAL | Status: DC

## 2011-02-05 NOTE — Progress Notes (Signed)
  Subjective:    Patient ID: Debra Graham, female    DOB: [birth year], 55 y.o.   MRN: [id number]  HPI  55 yr old with  Past Medical History  Diagnosis Date  . Osteoarthritis   . Carpal tunnel syndrome   . Depression   . Diabetes mellitus   . Peripheral neuropathy   . Hyperlipidemia   . History of cocaine abuse   . Hypertension   . Adenocarcinoma, colon 11/09    s/p subtotal colectomy with primary anastamosis by Dr. Johna Sheriff; Chemotherapy per Dr. Twanna Hy  . CHF (congestive heart failure)     Cath 12/2005>>Nonischemic cardiomyopathy with mild coronary irregularities. done  by Dr. Riley Kill   comes to the clinic for hypertension management. Patient reports to be taking all medications as directed.  Weight gain: Concerned about weight although she does report to not to be active. Denies chest pain, palpitations, diaphoresis, shortness of breath, lower extremity swelling, orthopnea, fever/chills, n/v/d.  Reports to have had Colonoscopy by Dr. Elnoria Howard in the past year.  Review of Systems  All other systems reviewed and are negative.       Objective:   Physical Exam  Constitutional: She is oriented to person, place, and time.       Obese  HENT:  Mouth/Throat: Oropharynx is clear and moist.  Eyes: Conjunctivae and EOM are normal. Pupils are equal, round, and reactive to light.  Neck: Normal range of motion. Neck supple.  Cardiovascular: Normal rate, regular rhythm and normal heart sounds.   Pulmonary/Chest: Effort normal and breath sounds normal.  Abdominal: Soft. Bowel sounds are normal.  Musculoskeletal: Normal range of motion.  Neurological: She is alert and oriented to person, place, and time.  Psychiatric: She has a normal mood and affect.          Assessment & Plan:

## 2011-02-05 NOTE — Assessment & Plan Note (Signed)
Discussed the risk and benefits of bariatric surgery. Patient would like a trial of therapeutic lifestyle changes but may be interested in the future. Instructed to start low impact exercises such as water aerobics, and bicycling. May also benefit from Nutritionist referral, will consider on follow up.

## 2011-02-05 NOTE — Assessment & Plan Note (Signed)
Improved but still not at goal 130/80. Will increase Norvasc to 10mg  daily and recheck BP in one month.

## 2011-02-05 NOTE — Assessment & Plan Note (Signed)
Above goal LDL <100. Will change simvastatin to lipitor. Check FLP and cmet in 3 months.

## 2011-02-05 NOTE — Assessment & Plan Note (Signed)
Patient had Colonoscopy done 06/2010 which showed intact anastomosis, large hemorrhoids, diverticula. Recommendations are for repeat colonoscopy in 3 yrs.

## 2011-02-05 NOTE — Patient Instructions (Signed)
Make follow up appointment in 1 month. Remember to stop taking simvastatin. Start taking Lipitor for high cholesterol. Remember Norvasc (amlonidipine was increased to 10 mg by mouth daily). Continue taking all other medication as directed.

## 2011-02-07 LAB — GLUCOSE, CAPILLARY: Glucose-Capillary: 123 mg/dL — ABNORMAL HIGH (ref 70–99)

## 2011-02-11 LAB — GLUCOSE, CAPILLARY: Glucose-Capillary: 138 mg/dL — ABNORMAL HIGH (ref 70–99)

## 2011-02-20 ENCOUNTER — Other Ambulatory Visit (INDEPENDENT_AMBULATORY_CARE_PROVIDER_SITE_OTHER): Payer: Medicare Other | Admitting: *Deleted

## 2011-02-20 DIAGNOSIS — I10 Essential (primary) hypertension: Secondary | ICD-10-CM

## 2011-02-21 ENCOUNTER — Other Ambulatory Visit (INDEPENDENT_AMBULATORY_CARE_PROVIDER_SITE_OTHER): Payer: Medicare Other | Admitting: Internal Medicine

## 2011-02-21 DIAGNOSIS — E785 Hyperlipidemia, unspecified: Secondary | ICD-10-CM

## 2011-02-21 DIAGNOSIS — I10 Essential (primary) hypertension: Secondary | ICD-10-CM

## 2011-02-22 MED ORDER — LISINOPRIL 40 MG PO TABS: 40.0000 mg | ORAL_TABLET | Freq: Every day | ORAL | Status: DC

## 2011-02-22 MED ORDER — SERTRALINE HCL 50 MG PO TABS: 50.0000 mg | ORAL_TABLET | Freq: Every day | ORAL | Status: AC

## 2011-03-08 ENCOUNTER — Ambulatory Visit (INDEPENDENT_AMBULATORY_CARE_PROVIDER_SITE_OTHER): Payer: Medicare Other | Admitting: Internal Medicine

## 2011-03-08 ENCOUNTER — Encounter: Payer: Self-pay | Admitting: Internal Medicine

## 2011-03-08 VITALS — BP 142/75 | HR 66 | Temp 96.0°F | Wt 286.0 lb

## 2011-03-08 DIAGNOSIS — E669 Obesity, unspecified: Secondary | ICD-10-CM

## 2011-03-08 DIAGNOSIS — I10 Essential (primary) hypertension: Secondary | ICD-10-CM

## 2011-03-08 DIAGNOSIS — Z23 Encounter for immunization: Secondary | ICD-10-CM

## 2011-03-08 DIAGNOSIS — E119 Type 2 diabetes mellitus without complications: Secondary | ICD-10-CM

## 2011-03-08 MED ORDER — TRAMADOL HCL 50 MG PO TABS: 50.0000 mg | ORAL_TABLET | Freq: Four times a day (QID) | ORAL | Status: DC | PRN

## 2011-03-08 MED ORDER — FUROSEMIDE 20 MG PO TABS: 20.0000 mg | ORAL_TABLET | Freq: Every day | ORAL | Status: DC

## 2011-03-08 NOTE — Progress Notes (Signed)
Addended by: Kingsley Spittle on: 03/08/2011 10:18 AM   Modules accepted: Orders

## 2011-03-08 NOTE — Assessment & Plan Note (Signed)
Patient's blood pressure is 142/75 which is much better than what it was last time around. I will not make any changes in the medications today. Continue to make lifestyle changes and encourage weight loss. I would give her 1800-calorie diet regimen today.

## 2011-03-08 NOTE — Assessment & Plan Note (Signed)
No changes in meds today. The patient was advised to lose weight. Was given 1800 diabetic diet pamphlet today. Followup in 3 months

## 2011-03-08 NOTE — Progress Notes (Signed)
  Subjective:    Patient ID: Debra Graham, female    DOB: 1956/06/27, 55 y.o.   MRN: [id number]  HPI  Ms. Crotteau is a 55 year old female with past medical history of diabetes type 2, hyperlipidemia, depression and hypertension. Patient is here today for followup appointment after a blood pressure medication was changed by Dr. Cena Benton a month ago.  The patient's blood pressure is 142/75 today which is much better than what it was. I will not change any medications today.  Obesity- patient gained weight since last visit. Patient says that she cannot really exercise a lot, [place] treadmill or bicycle. We talked about calorie counting today and we'll try her on a heating 100-calorie diet.  The patient is due for tetanus shot and we will give one today.  Review of Systems  Constitutional: Negative for fever, activity change and appetite change.  HENT: Negative for sore throat.   Respiratory: Negative for cough and shortness of breath.   Cardiovascular: Negative for chest pain and leg swelling.  Gastrointestinal: Negative for nausea, abdominal pain, diarrhea, constipation and abdominal distention.  Genitourinary: Negative for frequency, hematuria and difficulty urinating.  Neurological: Negative for dizziness and headaches.  Psychiatric/Behavioral: Negative for suicidal ideas and behavioral problems.       Objective:   Physical Exam  Constitutional: She is oriented to person, place, and time. She appears well-developed and well-nourished.  HENT:  Head: Normocephalic and atraumatic.  Eyes: Conjunctivae and EOM are normal. Pupils are equal, round, and reactive to light. No scleral icterus.  Neck: Normal range of motion. Neck supple. No JVD present. No thyromegaly present.  Cardiovascular: Normal rate, regular rhythm, normal heart sounds and intact distal pulses.  Exam reveals no gallop and no friction rub.   No murmur  heard. Pulmonary/Chest: Effort normal and breath sounds normal. No respiratory distress. She has no wheezes. She has no rales.  Abdominal: Soft. Bowel sounds are normal. She exhibits no distension and no mass. There is no tenderness. There is no rebound and no guarding.  Musculoskeletal: Normal range of motion. She exhibits no edema and no tenderness.  Lymphadenopathy:    She has no cervical adenopathy.  Neurological: She is alert and oriented to person, place, and time.  Psychiatric: She has a normal mood and affect. Her behavior is normal.          Assessment & Plan:

## 2011-03-08 NOTE — Assessment & Plan Note (Signed)
The patient has an option for undergoing bariatric surgery but she still wants to try out measures like lifestyle modifications.

## 2011-03-08 NOTE — Patient Instructions (Signed)
1800 Calorie Diabetic Diet The 1800 calorie diabetic diet is designed for eating up to 1800 calories each day. Following this diet and making healthy meal choices can help improve overall health. It controls blood sugar levels, and it can also help lower blood pressure and cholesterol. SERVING SIZES Measuring foods and serving sizes helps to make sure you are getting the right amount of food. The list below tells how big or small some common serving sizes are:  1 ounce (oz)...................................4 stacked dice.   3 oz................................................Deck of cards.   1 teaspoon (tsp).............................Tip of little finger.   1 tablespoon (Tbsp).......................Thumb.   2 Tbsp...........................................Golf ball.   1/2 cup..........................................Half of a fist.   1 cup.............................................A fist.  GUIDELINES FOR CHOOSING FOODS The goal of this diet is to eat a variety of foods and limit calories to 1800 each day. This can be done by choosing foods that are low in calories and fat. The diet also suggests eating small amounts of food frequently. Doing this helps control your blood sugar levels so they do not get too high or too low. Each meal or snack may include a protein food source to help you feel more satisfied. Try to eat about the same amount of food around the same time each day. This includes weekend days, travel days, and days off work. Space your meals about 4 to 5 hours apart, and add a snack between them, if you wish.  For example, a daily food plan could include breakfast, a morning snack, lunch, dinner, and an evening snack. Healthy meals and snacks have different types of foods, including whole grains, vegetables, fruits, lean meats, poultry, fish, and dairy products. As you plan your meals, select a variety of foods. Choose from the bread and starch, vegetable, fruit, dairy, and  meat/protein groups. Examples of foods from each group are listed below with their suggested serving sizes. Use measuring cups and spoons to become familiar with what a healthy portion looks like. Bread and Starch Each serving equals 15 grams of carbohydrates. 1 slice bread. 1/4 bagel. 3/4 cup cold cereal (unsweetened). 1/2 cup hot cereal or mashed potatoes. 1 small potato (size of a computer mouse). 1/3 cup cooked pasta or rice. 1/2 English muffin. 1 cup broth-based soup. 3 cups of popcorn. 4 to 6 whole-wheat crackers. 1/2 cup cooked beans, peas, or corn. Vegetable Each serving equals 5 grams of carbohydrates. 1/2 cup cooked vegetables. 1 cup raw vegetables. 1/2 cup tomato or vegetable juice. Fruit Each serving equals 15 grams of carbohydrates. 1 small apple or orange. 1-1/4 cup watermelon or strawberries . 1/2 cup applesauce (no sugar added). 2 Tbsp raisins. 1/2 banana. 1/2 cup canned fruit, packed in water or in its own juice. 1/2 cup unsweetened fruit juice. Dairy Each serving equals 12 to 15 grams of carbohydrates. 1 cup fat-free milk. 6 oz artificially sweetened yogurt or plain yogurt. 1 cup low-fat buttermilk. 1 cup soy milk. 1 cup almond milk. Meat/Protein 1 large egg. 2 to 3 oz meat, poultry, or fish. 1/4 cup low-fat cottage cheese. 1 Tbsp peanut butter. 1 oz low-fat cheese. 1/4 cup tuna, packed in water. 1/2 cup tofu. Fat 1 tsp oil. 1 tsp trans-fat-free margarine. 1 tsp butter. 1 tsp mayonnaise. 2 Tbsp avocado. 1 Tbsp salad dressing. 1 Tbsp cream cheese. 2 Tbsp sour cream. SAMPLE 1800 CALORIE DIET PLAN Breakfast:  3/4 cup unsweetened cereal (1 carb choice).   1 cup fat-free milk (1 carb choice).   1 slice whole-wheat toast (1 carb choice).     1/2 small banana (1 carb choice).   1 scrambled egg.   1 tsp trans-fat-free margarine.  Lunch:  Tuna sandwich.   2 slices whole-wheat bread (2 carb choices).   1/2 cup canned tuna in water,  drained.   1 Tbsp reduced fat mayonnaise.   1 stalk celery, chopped.   2 slices tomato.   1 lettuce leaf.   1 cup carrot sticks.   24 to 30 seedless grapes (2 carb choices).   6 oz light yogurt (1 carb choice).  Afternoon Snack:  3 graham cracker squares (1 carb choice).   1 cup fat-free milk (1 carb choice).   1 Tbsp peanut butter.  Dinner:  3 oz salmon, broiled with 1 tsp oil.   1 cup mashed potatoes (2 carb choices) with 1 tsp trans-fat-free margarine.   1 cup fresh or frozen green beans.   1 cup steamed asparagus.   1 cup fat-free milk (1 carb choice).  Evening Snack:  3 cups of air-popped popcorn (1 carb choice).   2 Tbsp Parmesan cheese.  Meal Plan You can use this worksheet to help you make a daily meal plan based on the 1800 calorie diabetic diet suggestions. If you are using this plan to help you control your blood glucose, you may interchange carbohydrate-containing foods (dairy, starches, and fruits). Select a variety of fresh foods of varying colors and flavors. The total amount of carbohydrate in your meals or snacks is more important than making sure you include all of the food groups every time you eat. Choose from the approximate amount of the following foods to build your day's meals:  8 Starches.  4 Vegetables.   3 Fruits.  2 Dairy.   6 to 7 oz Meat/Protein.  Up to 4 Fats.   Your dietician can use this worksheet to help you decide how many servings and which types of foods are right for you. Breakfast Food Group and Servings Food Choice Starches ________________________________________________________ Dairy __________________________________________________________ Fruit ___________________________________________________________ Meat/Protein_____________________________________________________ Fat ____________________________________________________________ Lunch Food Group and Servings Food Choice Starch  _________________________________________________________ Meat/Protein ____________________________________________________ Vegetables ______________________________________________________ Fruit ___________________________________________________________ Dairy __________________________________________________________ Fat ____________________________________________________________ Afternoon Snack Food Group and Servings Food Choice Starch __________________________________________________________ Meat/Protein_____________________________________________________ Fruit ___________________________________________________________ Dairy __________________________________________________________ Dinner Food Group and Servings Food Choice Starches ________________________________________________________ Meat/Protein ____________________________________________________ Dairy __________________________________________________________ Vegetable ______________________________________________________ Fruit ___________________________________________________________ Fat ____________________________________________________________ Evening Snack Food Group and Servings Food Choice Fruit ___________________________________________________________ Meat/Protein ____________________________________________________ Dairy __________________________________________________________ Starch __________________________________________________________ Daily Totals Starches _________________________ Vegetables _________________________ Fruits _____________________________ Dairy _____________________________ Meat/Protein________________________ Fats _______________________________  Document Released: 05/28/2005 Document Re-Released: 04/25/2010 [employer] Patient Information 2011 [employer]. 

## 2011-03-29 ENCOUNTER — Other Ambulatory Visit (INDEPENDENT_AMBULATORY_CARE_PROVIDER_SITE_OTHER): Payer: Medicare Other | Admitting: Internal Medicine

## 2011-03-29 DIAGNOSIS — I10 Essential (primary) hypertension: Secondary | ICD-10-CM

## 2011-04-03 NOTE — Consult Note (Signed)
NAMEALAHNA, DUNNE NO.:  [id number]   MEDICAL RECORD NO.:  [id number]          PATIENT TYPE:  INP   LOCATION:  2020                         FACILITY:  [facility]   PHYSICIAN:  Antonietta Breach, M.D.  DATE OF BIRTH:  1956/06/03   DATE OF CONSULTATION:  09/28/2008  DATE OF DISCHARGE:  09/24/2008                                 CONSULTATION   REQUESTING PHYSICIAN:  Gavin Pound, M.D.   REASON FOR CONSULTATION:  Depression.   HISTORY OF PRESENT ILLNESS:  Mrs. Davoli is a 56 year old female  admitted to the [facility] on September 14, 2008 due to a mass in her  colon.  She also has had a hypertensive urgency.   The patient has been having a number of weeks of progressive depressed  mood and low energy.  She has been crying frequently,  She has been  having some suicidal thoughts.  She was having some experiences of  people knocking on the  window.  Zyprexa has been started.  Paxil was  just started after her surgery.  She had her Paxil started yesterday.   The patient states that she did call the [facility] because of her depression.   PAST PSYCHIATRIC HISTORY:  The patient does have a history of a suicide  attempt remotely.   FAMILY PSYCHIATRIC HISTORY:  None known.   SOCIAL HISTORY:  She has been living with her son.  Her mother passed  away recently.  Her husband has also passed away.  She has been trying  to get disability.  She has a history of using cocaine.   PAST MEDICAL HISTORY:  1. Colon cancer with a resection.  2. Congestive heart failure.  3. Diabetes mellitus.  4. Hypertension.   MEDICATIONS:  The MAR is reviewed.  Paxil 20 mg daily.   ALLERGIES:  No known drug allergies.   Sodium 139, WBC 5.0.  Urine drug screen positive for cocaine, opiates.   REVIEW OF SYSTEMS:  She does have a history of being tried on Prozac,  Zoloft, Lexapro and Effexor.  She has no history of seizures.  No  history of mania.   EXAMINATION:  VITAL SIGNS:  Temperature 97.7, pulse 67, respiratory rate  18, blood pressure 169/87, O2 saturation on room air 98%.   MENTAL STATUS EXAM:  The patient is alert, oriented to all spheres.  Speech within normal limits.  Thought process logical, coherent, goal-  directed.  No looseness of associations.  Thought content:  No thoughts  of harming herself, no thoughts of harming others.  No delusions, no  hallucinations.  Affect is broad and appropriate.  Mood within normal  limits.  Judgment is intact.  Insight intact.  She is oriented to all  spheres.  She is socially appropriate.   ASSESSMENT:  Axis I:  293.83 Mood disorder not otherwise specified  (idiopathic and general medical factors).  Major depressive disorder.   RECOMMENDATIONS:  The indications, alternatives and adverse effects of  Wellbutrin were discussed with the patient for augmenting and  antidepression.  The patient understands the risk of seizures  and the  other information and wants to proceed with adding Wellbutrin.  1. Would add Wellbutrin at 100 mg SR p.o. q.a.m.  Would then increase      to 100 mg SR b.i.d. in 3 days.  2. Would increase the patient's Paxil to 30 mg q.a.m. for 3 days, then      increase as tolerated to 40 mg daily in 4 days.  3. The patient agrees to not drive if drowsy once she gets her driving      capability back.  The indications, alternatives and adverse effects of trazodone and Paxil  were reviewed with the patient.  She understands and wants to proceed as  above and she wants to proceed with trazodone as below.  Trazodone will  be utilized for the insomnia that she had been having for several days.  1. Trazodone 25 mg nightly.  Would increase the trazodone by 25 mg      based upon how she sleeps increasing the trazodone by 25 mg per day      until her sleep is normal not to exceed 200 mg nightly.   The patient declined any form of inpatient psychiatric care.  The  undersigned did  recommend that she attend an inpatient dual diagnosis  program.  However, she declined and she is not committable.   Regarding followup, would have the social worker set this patient up  with an outpatient psychiatric followup as soon as possible and to offer  [facility] to the patient as well as 12-step information and groups.   Psychiatric followup can be found at when the clinics attached to [facility], [facility] or [facility] or the [facility].   The patient is not at risk to harm herself or others.  She does agree to  call emergency services immediately for any thoughts of harming herself,  thoughts of harming others or distress.      Antonietta Breach, M.D.  Electronically Signed     JW/MEDQ  D:  09/28/2008  T:  09/28/2008  Job:  [id number]

## 2011-04-03 NOTE — Consult Note (Signed)
Debra Graham, RANE NO.:  [id number]   MEDICAL RECORD NO.:  [id number]          PATIENT TYPE:  INP   LOCATION:  6707                         FACILITY:  [facility]   PHYSICIAN:  Cassell Clement, M.D. 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  She requires urgent surgery.  She does  have a history of known heart disease.  She was found to have a  nonischemic dilated cardiomyopathy by cardiac catheterization on  January 05, 2008, by Dr. Riley Kill.  Three days later, she was readmitted  and an echocardiogram at that time on January 11, 2008, showed an  ejection fraction of 20-25%.  Her past several admissions have all been  notable for cocaine abuse.  The patient takes no regular medicine, and  is not under any regular medical care.  She denies any history of chest  pain or angina.  She does have a history of exertional dyspnea.  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   PAST MEDICAL HISTORY:  Positive for:  1. Congestive heart failure.  2. Cardiomyopathy.  3. Hypertension.  4. Diabetes.  5. Hyperlipidemia.  6. Obesity.   PAST SURGICAL HISTORY:  Hysterectomy with bilateral salpingo-  oophorectomy.  As noted, she does drink alcohol and smokes marijuana and  does crack cocaine.   ALLERGIES:  She has no known drug allergies.   PHYSICAL EXAMINATION:  VITAL SIGNS:  Her blood pressure is 145/90, pulse  is 85 in normal sinus rhythm, and O2 saturation 98% on room air.  CHEST:  Clear.  HEART:  An S4 gallop.  There is no murmur.  ABDOMEN:  Soft and nontender, but is distended, and she has an NG tube  in place.  EXTREMITIES:   No phlebitis or edema.   Her electrocardiogram done tonight shows LVH with strain, poor R-wave  progression consistent with her known dilated cardiomyopathy and left  axis deviation.   IMPRESSION:  The patient is at higher than average risk for surgery  tomorrow because of her known left ventricular systolic dysfunction  secondary to nonischemic cardiomyopathy.  However, it will be okay to  proceed  with this urgent surgery as planned.  We should avoid excessive IV  fluids to avoid postoperative pulmonary edema.  Should be continued on  her IV Lasix and IV enalapril and give adequate potassium repletion  until able to take orally her medication again.           ______________________________  Cassell Clement, M.D.     TB/MEDQ  D:  09/16/2008  T:  09/17/2008  Job:  [id number]   cc:   Gabrielle Dare. Janee Morn, M.D.

## 2011-04-03 NOTE — Op Note (Signed)
Debra Graham, Debra Graham             ACCOUNT NO.:  [id number]   MEDICAL RECORD NO.:  [id number]          PATIENT TYPE:  INP   LOCATION:  6525                         FACILITY:  [facility]   PHYSICIAN:  Sharlet Salina T. Hoxworth, M.D.DATE OF BIRTH:  04-13-1956   DATE OF PROCEDURE:  09/17/2008  DATE OF DISCHARGE:                               OPERATIVE REPORT   PREOPERATIVE DIAGNOSIS:  Obstructing tumor of the left colon.   POSTOPERATIVE DIAGNOSIS:  Obstructing tumor of the left colon.   SURGICAL PROCEDURE:  Subtotal colectomy with ileosigmoidostomy.   SURGEON:  Lorne Skeens. Hoxworth, MD   ASSISTANT:  Kelle Darting. Rennis Harding, NP   BRIEF HISTORY:  Ms. Cianci is a 55 year old female who presents with  abdominal pain and distention, and on CT scan has been found to have an  apple-core lesion of the left colon at about the mid left colon with  proximal dilatation of the colon, but not complete obstruction.  She has  been admitted and underwent a gentle bowel prep over about 2 days with  passing of solid and then liquid stool.  We have recommended proceeding  with laparotomy and resection.  The nature of the procedure, its  indications, risks of bleeding, infection, possible colostomy, possible  anastomotic leak, and anesthetic and cardiorespiratory locations have  been discussed and understood.  She received IV preoperative  antibiotics.  She is brought to the operating room for this procedure.   DESCRIPTION OF OPERATION:  The patient was brought to the operating room  and placed in supine position on the operating table, and general  endotracheal anesthesia was induced.  PAS were placed.  The abdomen was  widely and sterilely prepped and draped after placement of Foley  catheter.  Correct patient and procedure were verified.  A midline  incision skirting the umbilicus was used.  The patient was morbidly  obese.  Dissection was carried down through the subcutaneous tissue to  the midline fascia, which  was incised with cautery, and the peritoneum  was entered under direct vision.  On entering the peritoneal cavity,  there was a moderate amount of blood-tinged peritoneal fluid.  There  were some adhesions of the ileum to the low midline from previous  hysterectomy.  These were taken down sharply.  The abdomen was then  explored.  There was moderate distention of the colon proximal to the  splenic flexure, but the cecum actually was quite distended with some  serosal splits, and was hemorrhagic in areas and quite thickened from  chronic obstruction.  It was quite dilated.  The tumor was palpable in  the left colon distal to, but not that far from the splenic flexure.  The distal colon appeared entirely normal.  Small bowel retroperitoneum  was unremarkable.  Liver, gallbladder, and stomach all were  unremarkable.  Based on the findings of the dilated and very abnormal-  appearing cecum, I felt the best course was to resect from the terminal  ileum around to normal bowel distal to the tumor and perform an ileum to  mid sigmoid anastomosis.  Following this, the abdominal colon was  extensively  mobilized.  Lateral peritoneal attachments were divided  along the ileum, cecum, and right colon.  The hepatic flexure was taken  down.  The hepatocolic ligament was divided with the LigaSure.  The  duodenum was identified and carefully protected as the right colon was  dissected off the retroperitoneum.  Following this, the left colon was  mobilized dividing peritoneal attachments and the left colon from the  sigmoid up toward the splenic flexure was mobilized including the area  of the tumor.  The omentum was dissected up off the transverse colon and  preserved and the lesser sac was entered, and the transverse colon fully  mobilized.  The splenic flexure was then taken down dividing the  splenocolic ligament with the LigaSure and mobilizing completely the  splenic flexure and proximal left colon,  which contained the tumor  mobilizing this mesentery well back toward the retroperitoneum and off  Gerota fascia to allow a wide mesenteric resection around the tumor.  Again, the bowel distal to the tumor appeared normal.  To allow about a  10-cm margin, I chose resection point of the midsigmoid colon.  The  bowel at this point was divided with the GIA stapler.  The terminal  ileum was divided near the ileocecal valve with the GIA stapler.  Beginning at the terminal ileum and the right colon, the mesentery was  then divided with the LigaSure except for larger vascular pedicles,  which were proximally clamped and ligated with 2-0 silk as well.  The  dissection progressed around the right colon and transverse colon, and  then we came around behind the tumor in the proximal left colon again  taking the mesentery very close to the origin.  Resection was then  carried down to the midsigmoid colon and the specimen removed.  I did  not identify the ureter as the mesentery resection around the tumor.  It  was really over Gerota fascia, and we did not get down into the pelvis  where I felt that the ureter would be anywhere near the area of  mesenteric dissection.  Following this, the abdomen was then thoroughly  irrigated and hemostasis assured.  Following that, an anastomosis was  created between the terminal ileum and the midsigmoid colon.  This was  done side-to-side with single firing with a GIA 75 mm stapler.  The  staple line was intact without bleeding.  The common enterotomy was then  closed in layers with running 3-0 chromic followed by inverting  seromuscular 2-0 silk.  The anastomosis appeared widely patent under no  tension.  The mesenteric defect was then closed with interrupted silks.  The abdomen was again thoroughly irrigated and hemostasis assured.  The  viscera returned to the anatomic position.  The midline fascia was  closed with looped running #1 PDS suture in the incision and  tied  centrally.  The subcu was irrigated, and the skin closed with staples.  Sponge, needle, and instrument counts were correct.  The patient was  taken to recovery in satisfactory condition.      Lorne Skeens. Hoxworth, M.D.  Electronically Signed     BTH/MEDQ  D:  09/17/2008  T:  09/18/2008  Job:  [id number]

## 2011-04-03 NOTE — Discharge Summary (Signed)
Debra Graham, HARPOLE NO.:  [id number]   MEDICAL RECORD NO.:  [id number]          PATIENT TYPE:  INP   LOCATION:  2020                         FACILITY:  [facility]   PHYSICIAN:  Ileana Roup, M.D.  DATE OF BIRTH:  July 17, 1956   DATE OF ADMISSION:  09/15/2008  DATE OF DISCHARGE:  09/24/2008                               DISCHARGE SUMMARY   ATTENDING PHYSICIAN:  Ileana Roup, MD   DISCHARGE DIAGNOSES:  1. Invasive colonic adenocarcinoma.  2. Congestive heart failure, ejection fraction of 20-25%.  3. Diabetes mellitus type 2.  4. Hypertension.  5. Major depression.  6. Mood disorder, not otherwise specified.  7. Tobacco abuse.   DISCHARGE MEDICATIONS:  1. Wellbutrin SR 100 mg p.o. in the morning daily.  2. Paxil 30 mg p.o. for 3 days, then 40 mg p.o. for the next 4 days.  3. Trazodone 25 mg p.o. nightly.  4. Percocet 5/325 mg 1-2 tablets p.o. q.4 h. for pain.  5. Lisinopril 20 mg p.o. daily.  6. Furosemide 20 mg p.o. daily.  7. Digoxin 0.125 mg p.o. daily.  8. KCl 20 mEq p.o. daily.  9. Simvastatin 40 mg p.o. daily.  10.Glipizide 10 mg p.o. b.i.d.  11.Nitro-Dur 0.4 mg sublingual as needed for chest pain.   CONDITION ON DISCHARGE:  The patient was discharged POD #7 following  subtotal colectomy and ileosigmoid anastomosis.  The patient responded  well to treatment.  The patient will follow up with Dr. Janyth Pupa in the  [facility] on November 2009, at which the patient should have CBC and a BMET  drawn.  The patient will also follow up with Dr. Johna Sheriff on October 15, 2008, at 11:55 a.m.  The patient will also follow up with Dr.  Myna Hidalgo first week of December.   PROCEDURES:  1. Subtotal colectomy with ileosigmoid anastomosis by Dr. Sharlet Salina T.      Hoxworth on September 17, 2008.  Histopathology reported as invasive      colonic adenocarcinoma of the distal left colon with obstruction,      7.5 cm, moderately differentiated.  Tumor invades into  periintestinal soft tissue.  Mucosal and radial margins negative      for tumor.  Separate 1 cm tubular adenoma with high-grade      dysplasia.  Also noted diverticulosis, appendix with no pathologic      abnormality, lymph nodes periintestinal, regional resection, 29      lymph nodes negative for tumor.  Benign lymphoid hyperplasia.  2. Acute abdominal series on September 16, 2008, showed cardiomegaly, no      active cardiopulmonary disease, intestinal distention, primarily      colon with air-fluid levels, question ileus versus partial      obstruction.  3. CT abdomen and pelvis with contrast, September 16, 2008.  Impression:      Apple-core lesion mid to distal descending colon with evidence of      proximal obstruction.  Colon is filled with gas in the colon      proximal to this lesion.  Small adjacent metastatic lymph nodes.  No acute findings in the pelvis.  Abdominal x-ray on September 16, 2008, impression, persistent partial colonic obstruction secondary      to lesion in the mid descending colon demonstrated on recent CT.   CONSULTATIONS:  1. Lorne Skeens. Hoxworth, MD, surgeon.  2. Antonietta Breach, MD, Psychiatry.  3. Cassell Clement, MD, Cardiology.   HISTORY OF PRESENT ILLNESS:  A 55 year old Hispanic female with past  medical history of diabetes type 2, hypertension, polysubstance abuse,  presented with rectal bleeding.  The patient had had rectal bleeding on  bowel movements over the last 6-7 months on and off.  Typically, the  stool appeared maroon in color.  She avoided seeing a doctor because she  thought that the bleeding was from her chronic NSAID use.  However, over  the last 3-4 days, she has had diffuse, crampy abdominal pain with tarry  stools.  Also, the day of admission, she had 3 episodes of nonbloody,  bilious vomiting in the emergency room, denies cough, fever, shortness  of breath, or weight loss.   PHYSICAL EXAMINATION:  GENERAL:  No distress.  GI:   Obese distended abdomen.  No tenderness to palpation.  Hyperactive  bowel sounds.  EXTREMITIES:  Trace lower leg edema.  All other exam was within normal limits.   ADMITTING LABORATORY:  Sodium 140, potassium 3.8, chloride 103, bicarb  24, BUN 6, creatinine 0.67, glucose 188.  White blood cell 7.4,  hemoglobin 13.8, hematocrit 41.8, platelets 197, ANC 6.3, MCV 79.3,  bilirubin 0.9, AST 17, ALT 10, alkaline phosphatase 92, protein 7.3,  albumin 3.4, calcium 9.3, FOBT positive.  Urine drug screen was positive  for cocaine.   HOSPITAL COURSE:  1. Invasive colonic adenocarcinoma.  Upon admission, the patient was      placed n.p.o., labs were sent for CEA and CA99.  Surgery was      consulted, typed and crossed.  We treated nausea and vomiting with      Zofran.  The patient underwent surgery on September 17, 2008,      subtotal colectomy with ileosigmoid anastomosis performed by Dr.      Johna Sheriff and team.  Histopathology reported invasive adenocarcinoma      of the distal left colon with obstruction as per operative note.      There were no complications throughout hospitalization.      Postoperatively, wounds appeared healthy and at time of discharge      staples were taken out and surgical site was seen to be healing      well.  The patient's wounds were appropriately dressed.  The      patient will have home health for wound dressing.  The patient will      follow up with oncologist and surgeon post discharge from      hospitalization.  The patient may need PET scan to help decide      further need of chemotherapy.  2. Congestive heart failure.  Patient has history of nonischemic      cardiomyopathy.  Her EF according to 2-D echo in February 2009 was      20-25%.  Acutely, digoxin was held and she was started on ACE      inhibitor.  Cardiologist was consulted.  Dr. Patty Sermons cleared the      patient for surgery.  The patient had a period of nonsustained VT      postoperatively, but  Cardiology came and reassessed the patient.  Per Cardiology, the patient was clinically stable and was managed      medically.  The patient will follow up with primary care physician.  3. Hypertension.  The patient's fluctuating level of hypertension      within the hospital was attributed to a postoperative pain.  The      patient was placed on furosemide and lisinopril at the time of      discharge.  The patient will follow up with primary care physician      for further management.  4. Type 2 diabetes.  The patient's blood glucose was appropriately      controlled while in the hospital.  The patient will continue to      follow up with primary care physician.  5. Major depression and mood disorder, not otherwise specified.      Psychiatry consult was made and Dr. Jeanie Sewer came and evaluated      the patient.  The patient used to only take Paxil 20 mg and after      assessment with psychiatrist, the patient's medication was changed      and she was placed on Wellbutrin.  The patient will take      medications as per Dr. Providence Crosby recommendations.  The patient      did not have any suicidal ideation during the hospital stay.  The      patient will continue to follow up as an outpatient in [facility] by primary care physician.   DISCHARGE LABORATORY:  White blood cells 5, hemoglobin 9.7, hematocrit  29.4, platelets 205, sodium 139, potassium 3.7, chloride 112, bicarb 22,  BUN 2, creatinine 0.77, glucose 202, calcium 8.3.   DISCHARGE VITALS:  Temperature 97.7, blood pressure 169/87, pulse 67,  respiratory rate 18.      Danne Harbor, MD  Electronically Signed      Ileana Roup, M.D.  Electronically Signed    RV/MEDQ  D:  11/12/2008  T:  11/13/2008  Job:  [id number]

## 2011-04-03 NOTE — Discharge Summary (Signed)
NAMESHARALEE, WITMAN NO.:  [id number]   MEDICAL RECORD NO.:  [id number]          PATIENT TYPE:  INP   LOCATION:  3729                         FACILITY:  [facility]   PHYSICIAN:  Mick Sell, MD DATE OF BIRTH:  [birth year]   DATE OF ADMISSION:  01/09/2008  DATE OF DISCHARGE:  01/10/2008                               DISCHARGE SUMMARY   DISCHARGE DIAGNOSES:  1. Congestive heart failure exacerbation secondary to cocaine abuse.  2. Diabetes.  3. Hyperlipidemia.  4. Hypertension.  5. Polysubstance abuse.  6. Hypokalemia.  7. Depression.  8. Hematuria.   DISCHARGE MEDICATIONS:  1. Lisinopril 20 mg daily.  2. Lasix 20 mg b.i.d.  3. Zocor 40 mg daily.  4. Aspirin 81 mg daily.  5. Glipizide 10 mg b.i.d.  6. Nitroglycerin 0.5 mg sublingual for chest pain p.r.n.  7. Digoxin 0.125 mg daily.  8. Lexapro 10 mg daily.  9. K-Dur 20 mEq daily.   DISPOSITION:  Patient will be followed up at the [facility] with her primary care in 1-2 weeks.  Here, her blood pressure  will be rechecked to see if her blood pressure medication needs to be  adjusted.  Patient has a history of cocaine so I would suggest doing  either a beta-blocker with alpha and beta properties, or doing Norvasc.  Also, she will need a BMET to follow up on her potassium.  Also would  advise patient on daily weight and how important it is to keep a low-  salt diet.   PROCEDURES PERFORMED:  The patient had a 2D echocardiogram that showed  left ventricle was mildly dilated, overall left ventricular systolic  function was severely reduced, her ejection fraction was about 20-25%,  there was severe diffuse left ventricular hypokinesia, the left  ventricular wall thickness was mildly increased.  Doppler parameters  were consistent with high left ventricular filling pressures.  There was  moderate mitral annular calcification and some mitral regurg.  There was  left atrial dilation with an  intraarterial septum, bowing from left to  right, with increased left atrial pressures.  The peak pulmonary  pressure was moderately increased.  Her PA pressure was estimated to be  35 mmHg.   BRIEF ADMITTING H&P:  Ms. Dunkel is a 54 year old with past medical  history of nonischemic cardiomyopathy with an EF 20-25% with a cath in  2007 that showed nonobstructive diffuse disease.  A previous  echocardiogram showed diffuse hypokinesia.  She came in complaining of  shortness of breath.  This started the morning of admission.  She woke  up to go to the bathroom and started having exertional shortness of  breath.  This shortness of breath got worse with laying down.  She was  also complaining of a cough.  The pain was nonradiating.  She has been  productive sputum for 2 to 3 weeks which has not gotten worse.  She  relates no fever or chills.  She recently relates that her mother has  gotten sick with pneumonia and she is taking care of it.  She admits to  tobacco  and cocaine abuse.  Her last cocaine use last night.  She has  stopped all her medications for the last 8 to 10 months.  She also  relates she is in a lot of stress because of her caregiving about her  mother.  VITALS SIGN  Temperature was 99.3, blood pressure 124/80, pulse 93, respiration 28,  she was sating 97% on 4L.  LABS  Her sodium was 140, potassium 3.2, chloride 105, bicarb 26, BUN 7,  creatinine 0.7, glucose 232.  White count 10.8, hemoglobin 13.5,  platelets 170,000.  ANC 9.4, MCV 82.9, BNP 394.  UA showed 3-6 white  blood cells, 0-2 blood cells.  Chest x-ray shows some cardiomegaly with  some right basilar atelectasis and infiltrates.  UDS was positive for  cocaine.  Point of cardiac care markers were negative.   PROCEDURES PERFORMED:  1. Shortness of breath.  Patient was started on Lasix IV twice a day      and potassium was repleted as needed.  Her shortness of breath      improved with 2 doses of IV Lasix.  On  the morning of discharge,      her O2 saturation was 97% on 4L, this was stopped.  Patient was      walked and her saturation remained above 94% ON ROOM AIR.  Also,      antibiotics were started on the day of admission.  This was      discontinued because her white count was not high and patient did      not spike fever.  Her regimen was simplified to the medication as      above.  It is to note that patient has no income and her regimen      has to be as simple and economic as possible.  The patient might      also be a candidate for ICD, but this will be done as an outpatient      clinic.  2. Diabetes type 2.  Hemoglobin A1c was ordered.  Patient was started      on Lantus and glipizide.  3. Hyperlipidemia.  A fasting lipid panel was ordered.  Her statin was      increased.  She will follow up at the outpatient clinic.  4. Polysubstance abuse.  This was discussed with the patient the need      to stop using cocaine.  She agrees and she relates she only does it      from time to time and she will try to quit.  Social worker was      consulted for counseling.  5. Hematuria.  Patient was not complaining of any symptoms.  No CVA      tenderness on physical exam.  No dysuria.  6. Depression.  Lexapro was continued.  The patient was also advised      and counseled about options about going to see psychologist.  We      talked in length about the stress of taking care of her mother.  We      will also contact Social worker to help out with her mother as an      outpatient.   On the day of discharge her vitals were temperature 98.4 with pulse 64,  respiration 20, blood pressure 158/99, she was sating 99% on 2L.   LABS ON THE DAY OF DISCHARGE:  Her cardiac enzymes were negative.  Sodium 141, potassium 4.3, chloride 106, bicarb  28, glucose 143.  BUN 7,  creatinine 0.6, calcium 8.9.  Fasting lipid panel:  Cholesterol 233,  triglycerides 95, HDL 64, LDL 150, VLDL 19.  CBC:  White blood cell  8,  hemoglobin 12.9 and platelets 164,000.     Marinda Elk, M.D.  Electronically Signed      Mick Sell, MD  Electronically Signed   AF/MEDQ  D:  01/10/2008  T:  01/11/2008  Job:  [id number]

## 2011-04-03 NOTE — Consult Note (Signed)
Debra Graham, Debra Graham             ACCOUNT NO.:  [id number]   MEDICAL RECORD NO.:  [id number]          PATIENT TYPE:  INP   LOCATION:  1824                         FACILITY:  [facility]   PHYSICIAN:  Gabrielle Dare. Janee Morn, M.D.DATE OF BIRTH:  1956-05-26   DATE OF CONSULTATION:  DATE OF DISCHARGE:                                 CONSULTATION   CHIEF COMPLAINT:  Abdominal bloating, blood per rectum, nausea, and  vomiting.   HISTORY OF PRESENT ILLNESS:  Debra Graham is a 55 year old African  American female with multiple medical problems who presented to the  emergency department at [facility] complaining of 4-day history of  abdominal bloating and blood per rectum.  The patient progressively  worsened over the past 4 days and on presentation to the emergency  department had emesis x1.  Debra Graham was evaluated by Dr. Rito Ehrlich in the [facility] Emergency Department.  Workup is consistent with large bowel  obstruction secondary to a near obstructing distal left colon mass.  He  has asked [place] to evaluate for surgical treatment.  The patient is denying  current abdominal pain since Debra Graham has received some pain medication.   PAST MEDICAL HISTORY:  1. Congestive heart failure.  2. Cardiomyopathy.  3. Hypertension.  4. Diabetes.  5. Hyperlipidemia.  6. Obesity.   PAST SURGICAL HISTORY:  Tubal ligation and hysterectomy with bilateral  salpingo-oophorectomy.   SOCIAL HISTORY:  The patient is currently unemployed.  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  Debra Graham smokes cigarettes.  Debra Graham drinks alcohol.  Debra Graham also smokes marijuana  and crack cocaine.   CURRENT MEDICATIONS:  Occasional BC powder as needed.  Debra Graham is not taking  any other medications and Debra Graham reports Debra Graham has not followed up as Debra Graham was  supposed to recently with the Internal Medicine Teaching Service here.   ALLERGIES:  No known drug allergies.   REVIEW OF SYSTEMS:  GI System as above and in addition  the patient has  never had a colonoscopy and under the neuropsych system Debra Graham does note  some recent depression associated with the loss of her mother.  The  remainder of the review of systems was unremarkable.   PHYSICAL EXAMINATION:  VITAL SIGNS:  Temperature is 98.6, blood pressure  is 173/101, heart rate 103, respirations 20, and saturations is 97% on  room air.  GENERAL:  Debra Graham is awake and alert.  Debra Graham is in no distress, though Debra Graham is  intermittently tearful.  HEENT:  Nasogastric tube is in place and functional.  Sclerae is  injected; however, pupils were equal.  Oral mucosa is moist.  NECK:  Supple with no tenderness.  LYMPH:  No supraclavicular, cervical, bilateral axillary, periumbilical,  or inguinal lymphadenopathy.  PULMONARY:  Lungs are clear to auscultation.  No wheezing is heard.  Respiratory effort is good.  CARDIOVASCULAR:  Heart is regular, approximately 100 beats per minutes.  No murmurs are heard.  Debra Graham does have some mild-to-moderate peripheral  edema especially in the bilateral lower extremities.  ABDOMEN:  Obese and soft.  Debra Graham has moderate distention.  There is no  tenderness.  Bowel sounds are active.  No masses are palpated.  RECTAL:  Done by the emergency department physician, heme positive.  SKIN:  Warm and dry.  Debra Graham has a small abrasion on the left knee.   DATA REVIEWED:  White blood cell count 7.4, hemoglobin 13.8, and  platelets 197.  Sodium 140, potassium 3.8, chloride 103, CO2 24, BUN 6,  creatinine 0.67, and glucose 188.  CT scan of the abdomen and pelvis  demonstrates a near obstructing apple core-type lesion of the distal  left colon.  There are some associated lymph nodes visible.  The colon  proximal to this lesion has signs of obstruction with some gaseous  distention.  Small bowel is collapsed.   IMPRESSION:  1. Near obstructing distal left colon mass.  This was an apple core-      type lesion and most likely represents carcinoma.  I agree with  NG      tube decompression, IV fluid resuscitation, and medical admission.      We will plan a colectomy this week once Debra Graham is medically stabilized      and will follow up with you.  2. Uncontrolled hypertension.  3. Congestive heart failure.  4. Cardiomyopathy.  5. Diabetes mellitus.   Note, issues #2, #3, and #5 will be managed by Internal Medicine  Teaching Service.      Gabrielle Dare Janee Morn, M.D.  Electronically Signed     BET/MEDQ  D:  09/14/2008  T:  09/15/2008  Job:  [id number]   cc:   Annye Rusk, MD  Mick Sell, MD

## 2011-04-06 NOTE — Discharge Summary (Signed)
NAMESHARDAY, MICHL NO.:  [id number]   MEDICAL RECORD NO.:  [id number]          PATIENT TYPE:  INP   LOCATION:  4733                         FACILITY:  [facility]   PHYSICIAN:  Vanetta Mulders, MD         DATE OF BIRTH:  10/26/1956   DATE OF ADMISSION:  01/03/2006  DATE OF DISCHARGE:  01/07/2006                                 DISCHARGE SUMMARY   DISCHARGE DIAGNOSES:  1.  Nonischemic cardiomyopathy.  2.  Congestive heart failure.  3.  Diabetes mellitus type 2.  4.  Hypertension.  5.  Hyperlipidemia.  6.  Depression.  7.  Polysubstance abuse (cocaine, tobacco, marijuana, alcohol).   DISCHARGE MEDICATIONS:  1.  Lisinopril 20 mg daily.  2.  Atenolol 50 mg b.i.d.  3.  Aspirin 325 mg daily.  4.  Spironolactone 25 mg daily.  5.  Lexapro 10 mg daily.  6.  Simvastatin 10 mg daily.  7.  Lasix 40 mg b.i.d.  8.  Lantus 10 units nightly.  9.  Glipizide 10 mg b.i.d.  10. Digoxin 0.125 mg daily.  11. Nitroglycerin 0.4 mg every 5 minutes x3 as needed for chest pain.   PROCEDURE:  1.  Chest x-ray on February 15 showed CHF with interstitial edema.  2.  2-D echo on February 15 showed left ventricle markedly dilated      consistent with dilated cardiomyopathy; severe diffuse left ventricle      hypokinesis; ejection fraction 20-25%.  3.  Chest x-ray on February 16 showed radiographic improvement with less      edema.  4.  Cardiac catheterization on February 16 showed nonischemic      cardiomyopathy; ejection fraction 20-25%.   CONSULTATIONS:  1.  Willa Rough, M.D. of cardiology.  2.  Antonietta Breach, M.D. of psychiatry.   ADMISSION HISTORY:  Ms. Carrell is a 55 year old African-American female  with a history of uncontrolled hypertension and type 2 diabetes who presents  with a chief complaint of dyspnea and chest pain. She describes the onset of  the dyspnea as acute and starting at 7 a.m. on the day of admission upon  awakening. She denies having ever experienced this  before. She endorses  three-pillow orthopnea (which she attributes to the fact that the pillows  belong to her recently deceased husband and not to the shortness of breath).  She describes a chest pain pressure that does not radiate and that was  relieved by opening a window and inhaling cold air. The chest pain lasted  about 40 minutes. The patient also endorses lower extremity edema but denies  palpitations or fever. The patient was placed on CPAP by EMS and given 100  mg of IV Lasix, which yielded about 1200 mL of urine output within 5 hours  of administration. This markedly diminished her shortness of breath and  chest pain. The patient reports that despite having been diagnosed with  hypertension and type 2 diabetes, she has not seen a physician in more then  7 years secondary to financial concerns.   ALLERGIES:  NKDA.   MEDICATIONS ON ADMISSION:  None.  PAST MEDICAL HISTORY:  Hypertension, type 2 diabetes, status post TAH/BSO  for uterine fibroids in 2001.   FAMILY HISTORY:  Mother has hypertension, father died from a CVA and  hypertension. A brother died at age 75 from a myocardial infarction. The  patient has three children, one of which has hypertension.   SOCIAL HISTORY:  Positive for tobacco use (about 1/2 pack per day for 35  years). Positive for occasional alcohol use, positive for marijuana use. The  patient is unemployed. She is recently widowed (the patient has spent the  past 7 plus years caring for her father and husband who recently died from  cancer in 18-Jan-2007and she has been under significant stress secondary  to the loss of her husband and her associated financial hardships).   REVIEW OF SYSTEMS:  Positive for decreased weight (loss of appetite after  husband's death), fatigue, diaphoresis, syncope (when bending forward),  nausea, constipation, nocturia, polydipsia, joint pain (knees), blurry  vision, depression, anxiety, suicidal ideation (weekly and  with a plan to  use husband's pain medication), lump under right breast for which she is to  be seen soon in a GYN clinic.   ADMISSION PHYSICAL EXAM:  VITAL SIGNS:  Temperature 98.0, blood pressure 149-  174/90-92, pulse 101-115, respiratory rate 24-28, O2 saturation 97-100% on 4  liters nasal cannula.  GENERAL:  No acute distress, cooperative.  HEENT:  PERRLA, EOMI, oropharynx without erythema or exudates, mucous  membranes dry, neck supple without JVD, thyromegaly or bruits.  CARDIOVASCULAR:  Tachycardia, S3 gallop, pulses 2+ in all extremities.  PULMONARY:  Positive for rales, bibasilar right greater than left.  GI:  Soft, nontender, nondistended, normal active bowel sounds, no  hepatosplenomegaly.  EXTREMITIES:  1+ lower extremity edema.  SKIN:  Dry.  NEUROLOGIC:  Alert and oriented x3, cranial nerves II-XII grossly intact,  strength 5/5 in all extremities, deep tendon reflexes 2+ in upper and lower  extremities bilaterally, depressed affect.   ADMISSION LABS:  White blood cell count 11.4, hemoglobin 16.5, hematocrit  48.2, platelet count 174, ANC 8.4. Sodium 139, potassium 3.2, chloride 104,  bicarbonate 24, BUN 5, creatinine 0.8. Glucose 429, anion gap 11, total  bilirubin 0.9, alkaline phosphatase 78. AST 59, ALT 25, total protein 6.6,  albumin 3.3, calcium 8.4. Hemoglobin A1c 9.5, BNP (after Lasix) 274.8,  troponin was 0.08 followed by 0.10, followed by 0.04, total cholesterol 213,  triglycerides 95, HDL 55, LDL 139. Urine drug screen was positive for  cocaine and marijuana.   Admission EKG revealed sinus tachycardia at a rate of 118 with left axis  deviation.   HOSPITAL COURSE:  #1.  CHF/NONISCHEMIC CARDIOMYOPATHY:  The patient  presented with chief complaint of acute onset shortness of breath and chest  pressure on the morning of admission. She experienced significant relief of these symptoms after diuresis. Given that the patient has uncontrolled  hypertension and type  2 diabetes with an elevated troponin, a cardiology  consultation was obtained, and the decision was made to proceed with cardiac  catheterization. This revealed nonischemic cardiomyopathy, with an ejection  fraction of 20-25%. The patient did not experience further chest pain or  dyspnea during the remainder of her hospitalization. Her discharge  medications per cardiology recommendations were:  Lisinopril 20 mg daily.  Atenolol 50 mg b.i.d. Aspirin 325 mg daily. Spironolactone 25 mg daily.  Digoxin 0.125 mg daily.  Nitroglycerin 0.4 mg every 5 minutes x3 as needed  for chest pain. She has a followup  appointment scheduled for April 3 at 3:45  p.m. with Dr. Myrtis Ser.  #2.  DEPRESSION:  On admission, the patient endorsed feeling depressed  secondary to the recent death of her husband in mid Dec 13, 2022, and she  endorsed suicidal ideation with plan to commit suicide using pain  medications. A psychiatry consultation was obtained in order to determine  the necessary therapeutic approach. The patient was started on Lexapro 10 mg  daily for depression, and further evaluation on an inpatient basis at  [facility] was recommended by Dr. Jeanie Sewer. However, the patient  stated that she preferred to proceed with further evaluation on an  outpatient basis, and Dr. Jeanie Sewer was in agreement with this plan. Dr.  Jeanie Sewer was going to arrange for a followup appointment via either the  case manager or his staff. In addition, the patient will be following up  with Dr. Frederico Hamman in the Internal Medicine Clinic on February 26.  #3.  TYPE 2 DIABETES MELLITUS:  The patient had uncontrolled type 2 diabetes  mellitus for at least 7 years. Her glucose on admission was 429, and her  hemoglobin A1c was 9.5. In addition, she had several complaints related to  her diabetes, including blurry vision, polydipsia, and nocturia. Her blood  sugars were controlled while in the hospital with Lantus and glipizide. Her   glucose on the day of discharge was 217, so the decision was made to  increase the dose of Lantus upon discharge. She was discharged with samples  of Lantus 10 mg nightly and glipizide 10 mg b.i.d. She was counseled  regarding diabetes and regarding insulin injection.  #4.  HYPERTENSION:  The patient had uncontrolled hypertension for at least 7  years. On admission, her blood pressure was uncontrolled at 149-172/90-92.  The antihypertensives previously mentioned were started per cardiology  recommendations, and her blood pressure responded well to this therapy. She  was normotensive at 120/77 on the day of discharge.  #5.  POLYSUBSTANCE ABUSE:  The patient has a history of abuse of cocaine,  tobacco, marijuana and alcohol. It was explained to the patient that this  substance abuse is undoubtedly contributing to her cardiovascular. She was strongly encouraged to stop using these substances and encouraged to attend  AA and NA meeting.  #6.  HYPERLIPIDEMIA:  The patient had a fasting lipid panel on the first day  of hospitalization, which revealed an elevated LDL and total cholesterol.  She was started on simvastatin and discharged with a month sample of this  medication.   DISCHARGE PHYSICAL EXAM:  VITAL SIGNS:  Temperature 98.5, blood pressure 100-  120/58-77, pulse 71-96, respiratory rate 18, ins and outs were 540 in orally  and 1000 mL of urine output. Urine output was 40 cc/hour. O2 saturation was  97% on room air.  GENERAL:  No acute distress, cooperative.  CARDIOVASCULAR:  Regular rate and rhythm, S3 gallop.  PULMONARY:  Clear to auscultation bilaterally, no wheezes, rales or rhonchi.  GI:  Soft, nontender, nondistended, normal active bowel sounds.  EXTREMITIES:  No cyanosis, clubbing or edema.  NEUROLOGIC:  Flat affect.   DISCHARGE LABS:  White blood cell count 8.1, hemoglobin 14.3, hematocrit  41.2, platelet count 147. Sodium 136, potassium 3.9, chloride 99,  bicarbonate 30, BUN  22, creatinine 1.0.  Glucose 217, calcium 9.0.   DISPOSITION AND FOLLOWUP:  The patient's chest pain and dyspnea had resolved  upon discharge, and she had a cardiac catheterization revealing nonischemic  cardiomyopathy with an ejection fraction of 20-25%.  She will followup with  Dr. Myrtis Ser for further management of her direct vision on April 3 at 3:45 p.m.  She was deemed not to  be at risk for suicide by Dr. Jeanie Sewer, and she will followup with him at a  date to be arranged by either the case manager or his office staff. The  patient will have a hospital followup appointment with Dr. Vanetta Mulders in the  Internal Medicine Clinic on February 26 at 1:30 p.m.     ______________________________  Luther Parody, MS4    ______________________________  Vanetta Mulders, MD    JT/MEDQ  D:  01/08/2006  T:  01/08/2006  Job:  [id number]   cc:   Alvester Morin, M.D.  Fax: [phone]   Antonietta Breach, M.D.   Willa Rough, M.D.  [address]  [address]  [place]  [place] [address]

## 2011-04-06 NOTE — Cardiovascular Report (Signed)
NAMEKEISHIA, GROUND             ACCOUNT NO.:  [id number]   MEDICAL RECORD NO.:  [id number]          PATIENT TYPE:  INP   LOCATION:  4733                         FACILITY:  [facility]   PHYSICIAN:  Arturo Morton. Riley Kill, M.D. [facility] OF BIRTH:  October 09, 1956   DATE OF PROCEDURE:  01/04/2006  DATE OF DISCHARGE:                              CARDIAC CATHETERIZATION   INDICATIONS:  Ms. Duquette is a 55 year old who came in with shortness of  breath and positive troponins that are borderline.  She has diabetes and  hypertension.  She also has a positive toxicology screen for cocaine and  marijuana.  The current study was done to assess pressures and anatomy.   Risks, benefits and alternatives were discussed with the patient in the  catheterization laboratory, and the patient was previously set up for  catheterization by Dr. Dietrich Pates.   PROCEDURES:  1.  Right and left heart catheterization.  2.  Selective coronary territory.  3.  Selective left ventriculography.  4.  Hand injection into the aortic root.   DESCRIPTION OF PROCEDURE:  The patient was brought to the catheterization  laboratory and prepped and draped in the usual fashion.  Through an anterior  puncture, the right femoral vein was entered.  Initially a 7-French sheath  was placed.  A 7.5 French thermodilution Swan-Ganz catheter was then taken  to the pulmonary capillary wedge precision through serial pressure  measurements.  We did obtain a superior vena cava saturation as well as a  pulmonary artery saturation.  Following this, thermodilution cardiac outputs  were performed.  Following this, the right femoral artery was entered.  A 6-  French sheath was placed, a pigtail catheter was placed in the central aorta  and then subsequently and left ventricle.  Simultaneous LV wedge was  obtained.  Following this, ventriculography was performed in the RAO  projection.  Following a pressure pullback, the pigtail was removed.  The  right  heart catheter was then removed.  Coronary arteriography was then  performed using standard injections.  The patient tolerated the procedure  well and was taken to the holding area in satisfactory clinical condition.   HEMODYNAMIC DATA.:  1.  Central aortic pressure 157/98, mean 122.  2.  Left ventricular pressure 151/42.  3.  No gradient pullback across the aortic valve.  4.  Right atrial 6.  5.  RV 45-6.  6.  Pulmonary artery 45/24.  7.  Pulmonary capillary wedge 23.  8.  Superior vena cava saturation 59%.  9.  PA saturation 62%.  10. Aortic saturation 92%.  11. Fick cardiac output 3.5 L/min.  12. Fick cardiac index 1.7 L/min. per sq. m.  13. Thermodilution cardiac output 6.1 L/min.  14. Thermodilution cardiac index 2.9 L/min. per sq. m.   ANGIOGRAPHIC DATA.:  1.  Ventriculography was performed in the RAO projection.  Ejection      fractions cannot be calculated in lab 5.  Estimated ejection fraction      would be in the range of 20-25%.  There may be mild mitral      regurgitation.  2.  The left main is free of critical disease.  3.  The LAD courses to the apex.  It provides a major diagonal branch.  The      LAD and diagonal are free of critical disease and without significant      focal narrowing.  4.  The ramus intermedius has about 30% proximal irregularity and is a large-      caliber vessel but free of critical disease.  5.  The circumflex is a dominant vessel.  It provides a marginal system.      There is a posterior descending branch, which comes off and is modest in      size with about 30% segmental plaquing.  6.  The right coronary is a nondominant vessel.  7.  Hand injection did not suggest aortic regurgitation.   CONCLUSIONS:  Nonischemic cardiomyopathy with mild coronary irregularities.   RECOMMENDATIONS:  1.  Per Drs. Myrtis Ser and [place].  2.  The patient may need an EP evaluation.  3.  Hypertension control will be important.  4.  Diabetes mellitus  management will also be important.  5.  Aggressive risk factor reduction.  6.  The patient may need to have evaluation for sleep apnea.  7.  She will need to be followed in the CHF clinic, likely with long-term      follow-up with Dr. Myrtis Ser.      Arturo Morton. Riley Kill, M.D. [facility]  Electronically Signed     TDS/MEDQ  D:  01/04/2006  T:  01/04/2006  Job:  [id number]   cc:   Willa Rough, M.D.  [address]  [address]  [place]  [place] [address]   Alvester Morin, M.D.  Fax: [phone]   Cardiovascular Laboratory

## 2011-04-06 NOTE — Consult Note (Signed)
NAMEBRANDAN, GLAUBER NO.:  [id number]   MEDICAL RECORD NO.:  [id number]          PATIENT TYPE:  INP   LOCATION:  4733                         FACILITY:  [facility]   PHYSICIAN:  Willa Rough, M.D.     DATE OF BIRTH:  Apr 09, 1956   DATE OF CONSULTATION:  01/03/2006  DATE OF DISCHARGE:                                   CONSULTATION   Debra Graham is 55 years old and was admitted today with acute shortness  of breath and chest tightness.  Chest x-ray showed pulmonary edema and she  is diuresing and stabilizing.  She has diabetes and hypertension that have  not been treated over time due to her lack of looking for health care.  She,  unfortunately, recently lost her husband to cancer.  Today, she began having  chest tightness and shortness of breath.  Eventually, she came to the  hospital.  She has been treated appropriately and is stabilizing.  She has  had a very slight rise in her troponin.  Because of the acuteness of her  episode and the chest tightness, I am asked to see her to evaluate her  cardiac status.   ALLERGIES:  No known drug allergies.   MEDICATIONS:  The patient was taking no medications upon admission to the  hospital.   PAST MEDICAL HISTORY:  See the complete list below.   SOCIAL HISTORY:  The patient is very recently widowed.  Her husband died of  cancer.  She does not work.  She has lots of family support.  The patient  does smoke 1/2 pack per day.  She does use some marijuana.   FAMILY HISTORY:  The patient has a brother who died suddenly at  approximately 55 years of age.  She thinks that he had a heart attack.   REVIEW OF SYSTEMS:  The patient has lost some weight due to stress recently.  She has fatigue.  Today, she had diaphoresis with her shortness of breath.  She has not had any HEENT problems.  Sometimes, she has some mild dizziness  when leaning over.  The patient has had some nocturia.  She also has had  some polydipsia.  She has  knee pain and, at times, has blurred vision.  Recently, she has been quite depressed with grief over the recent loss of  her husband.  The remainder of her review of systems is negative.   PHYSICAL EXAMINATION:  GENERAL:  When seen in her room, the patient is stable at this time.  She is  no longer acutely short of breath.  The patient is oriented to person, time,  and place, and her affect is normal.  VITAL SIGNS:  Blood pressure 145/89, pulse 105, O2 saturation 97.  NECK:  The neck is thick and it is difficult to assess for jugular venous  distention. There are no carotid bruits.  HEENT:  Reveals no xanthelasma.  She has normal extraocular motion.  LUNGS:  A few basilar rales at this time, this is after significant  diuresis.  BREASTS:  Her breasts are not examined by me at this time.  ABDOMEN:  Soft, there are norma bowel sounds and there are no masses or  bruits.  EXTREMITIES:  At this time reveal only 1+ edema.  CARDIAC:  S1 with an S2.  There is also a summation gallop.  No obvious  murmurs are heard.   LABORATORY DATA:  Chest x-ray reveals interstitial edema.  Her BUN was 8  with a creatinine of 0.7.  Potassium 3.4.  Her point of care markers  revealed a troponin of less than 0.05 and a second one of 0.08.  Hemoglobin  16.5.  EKG reveals sinus tachycardia.  She has LVH with ST changes.  There  is slight anterior ST elevation.  There are no diagnostic changes of an  acute MI.   PROBLEM LIST:  1.  Diabetes untreated.  2.  Hypertension untreated.  3.  Status post hysterectomy in the past.  4.  Significant grief due to the recent loss of her husband of 30 years in      January 2007.  5.  Tobacco use.  6.  Marijuana use.  7.  Summation gallop on physical exam.  8.  Resting sinus tachycardia.  *9.  Acute shortness of breath today.  The patient improved with only modest  diuresis.  I suspect she has significant left ventricular dysfunction.  In  addition, she may have had an  acute ischemic event today.   PLAN:  1.  Continue appropriate diuresis.  2.  IV heparin.  3.  Low dose beta blocker as her resting heart rate is increased.  4.  ACE inhibitors as tolerated.  5.  Proceed with cardiac catheterization and 2D echo on January 04, 2006,      if she has stabilized adequately.  I have discussed this plan with her      and she is in agreement.  She has supportive family who want her to be      evaluated fully.           ______________________________  Willa Rough, M.D.     JK/MEDQ  D:  01/03/2006  T:  01/03/2006  Job:  [id number]

## 2011-05-15 ENCOUNTER — Other Ambulatory Visit: Payer: Self-pay | Admitting: Oncology

## 2011-05-15 ENCOUNTER — Ambulatory Visit (HOSPITAL_COMMUNITY)
Admission: RE | Admit: 2011-05-15 | Discharge: 2011-05-15 | Disposition: A | Discharge status: Home / Self Care | Payer: Medicare Other | Source: Ambulatory Visit | Attending: Oncology | Admitting: Oncology

## 2011-05-15 ENCOUNTER — Encounter (HOSPITAL_BASED_OUTPATIENT_CLINIC_OR_DEPARTMENT_OTHER): Payer: Medicare Other | Admitting: Oncology

## 2011-05-15 DIAGNOSIS — R197 Diarrhea, unspecified: Secondary | ICD-10-CM | POA: Insufficient documentation

## 2011-05-15 DIAGNOSIS — K5900 Constipation, unspecified: Secondary | ICD-10-CM | POA: Insufficient documentation

## 2011-05-15 DIAGNOSIS — R059 Cough, unspecified: Secondary | ICD-10-CM | POA: Insufficient documentation

## 2011-05-15 DIAGNOSIS — K439 Ventral hernia without obstruction or gangrene: Secondary | ICD-10-CM | POA: Insufficient documentation

## 2011-05-15 DIAGNOSIS — C189 Malignant neoplasm of colon, unspecified: Secondary | ICD-10-CM | POA: Insufficient documentation

## 2011-05-15 DIAGNOSIS — R0602 Shortness of breath: Secondary | ICD-10-CM | POA: Insufficient documentation

## 2011-05-15 DIAGNOSIS — R0789 Other chest pain: Secondary | ICD-10-CM | POA: Insufficient documentation

## 2011-05-15 DIAGNOSIS — C186 Malignant neoplasm of descending colon: Secondary | ICD-10-CM

## 2011-05-15 DIAGNOSIS — R109 Unspecified abdominal pain: Secondary | ICD-10-CM | POA: Insufficient documentation

## 2011-05-15 DIAGNOSIS — R05 Cough: Secondary | ICD-10-CM | POA: Insufficient documentation

## 2011-05-15 DIAGNOSIS — R110 Nausea: Secondary | ICD-10-CM | POA: Insufficient documentation

## 2011-05-15 LAB — CMP (CANCER CENTER ONLY)
ALT(SGPT): 21 U/L (ref 10–47)
AST: 22 U/L (ref 11–38)
Albumin: 3.3 g/dL (ref 3.3–5.5)
BUN, Bld: 16 mg/dL (ref 7–22)
Calcium: 9.2 mg/dL (ref 8.0–10.3)
Chloride: 99 mEq/L (ref 98–108)
Potassium: 4.9 mEq/L — ABNORMAL HIGH (ref 3.3–4.7)
Sodium: 137 mEq/L (ref 128–145)
Total Protein: 7.6 g/dL (ref 6.4–8.1)

## 2011-05-15 LAB — CBC WITH DIFFERENTIAL/PLATELET
BASO%: 0.3 % (ref 0.0–2.0)
Basophils Absolute: 0 10*3/uL (ref 0.0–0.1)
EOS%: 1.8 % (ref 0.0–7.0)
HGB: 12.6 g/dL (ref 11.6–15.9)
MCH: 29.9 pg (ref 25.1–34.0)
MCHC: 34.3 g/dL (ref 31.5–36.0)
MCV: 87.1 fL (ref 79.5–101.0)
MONO%: 5.4 % (ref 0.0–14.0)
NEUT%: 73.8 % (ref 38.4–76.8)
RDW: 14.7 % — ABNORMAL HIGH (ref 11.2–14.5)

## 2011-05-15 MED ORDER — IOHEXOL 300 MG/ML  SOLN: 100.0000 mL | Freq: Once | INTRAMUSCULAR | Status: AC | PRN

## 2011-05-21 ENCOUNTER — Encounter (HOSPITAL_BASED_OUTPATIENT_CLINIC_OR_DEPARTMENT_OTHER): Payer: Medicare Other | Admitting: Oncology

## 2011-05-21 DIAGNOSIS — E119 Type 2 diabetes mellitus without complications: Secondary | ICD-10-CM

## 2011-05-21 DIAGNOSIS — Z85038 Personal history of other malignant neoplasm of large intestine: Secondary | ICD-10-CM

## 2011-05-21 DIAGNOSIS — I10 Essential (primary) hypertension: Secondary | ICD-10-CM

## 2011-07-11 ENCOUNTER — Ambulatory Visit (INDEPENDENT_AMBULATORY_CARE_PROVIDER_SITE_OTHER): Payer: Medicare Other | Admitting: Internal Medicine

## 2011-07-11 ENCOUNTER — Encounter: Payer: Self-pay | Admitting: Internal Medicine

## 2011-07-11 ENCOUNTER — Encounter: Payer: Medicare Other | Admitting: Internal Medicine

## 2011-07-11 VITALS — BP 171/82 | HR 70 | Temp 98.8°F | Ht 66.0 in | Wt 282.0 lb

## 2011-07-11 DIAGNOSIS — F3289 Other specified depressive episodes: Secondary | ICD-10-CM

## 2011-07-11 DIAGNOSIS — E119 Type 2 diabetes mellitus without complications: Secondary | ICD-10-CM

## 2011-07-11 DIAGNOSIS — K921 Melena: Secondary | ICD-10-CM

## 2011-07-11 DIAGNOSIS — E669 Obesity, unspecified: Secondary | ICD-10-CM

## 2011-07-11 DIAGNOSIS — I10 Essential (primary) hypertension: Secondary | ICD-10-CM

## 2011-07-11 DIAGNOSIS — F329 Major depressive disorder, single episode, unspecified: Secondary | ICD-10-CM

## 2011-07-11 DIAGNOSIS — C189 Malignant neoplasm of colon, unspecified: Secondary | ICD-10-CM

## 2011-07-11 DIAGNOSIS — I509 Heart failure, unspecified: Secondary | ICD-10-CM

## 2011-07-11 MED ORDER — ACETAMINOPHEN 500 MG PO TABS: 1000.0000 mg | ORAL_TABLET | Freq: Three times a day (TID) | ORAL | Status: DC

## 2011-07-11 MED ORDER — DULOXETINE HCL 30 MG PO CPEP: 30.0000 mg | ORAL_CAPSULE | Freq: Every day | ORAL | Status: DC

## 2011-07-11 MED ORDER — QUETIAPINE FUMARATE 50 MG PO TABS: 50.0000 mg | ORAL_TABLET | Freq: Every day | ORAL | Status: DC

## 2011-07-11 NOTE — Assessment & Plan Note (Addendum)
1. Pt with recent suicidal ideation w/o plan. She reports that Pristiq or Cymbalta worked much better for her when prescribed by the [employer].  She cannot recall which medication specifically. 2.  We have discontinued Lyrica and Trazodone which it appears that the pt was not taking.  Pt reports currently taking Sertraline with is not effective per her report. 3.  Will start on Cymbalta 30 mg po qd and Seroquel 50 mg po at bedtime.  From her description of symptoms pt likely has some manic episodes as well as depression.  She reports insomnia, flights of ideas, racing thoughts, and decreased awareness of time span. 4. Pt agreed with verbal contract for safety. 5. Will return in 3-4 weeks to assess progress.

## 2011-07-11 NOTE — Assessment & Plan Note (Signed)
1. Pt w/o shortness of breath or complaints of edema or chest pain. 2. Currently pt takes Furosemide 20mg  po qd prn

## 2011-07-11 NOTE — Assessment & Plan Note (Signed)
1. Most recent CT Scan of Body without evidence of metastasis, stable partial colectomy, ventral abdominal hernia with long segment of non-obstructed small bowel. 2. Continue f/u with Dr. Gaylyn Rong (Oncology) as scheduled

## 2011-07-11 NOTE — Progress Notes (Signed)
  Subjective:    Patient ID: Debra Graham, female    DOB: [birth year], 55 y.o.   MRN: [id number]  HPI Debra Graham is a 55 y.o. 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 Adriana Mccallum (surgery) and Hr. Ha (oncology).  She presents today with c/o left sided abdominal pain which is constant, changes in quality from sharp to dull, and radiates to her back at times.  She also reports black tarry stools x1 on July 05, 2011. Upon further questioning she reports that she takes up to 6 [employer] Powders per day to "make her mood better". She states that her mood medications are not working well and she had thoughts of suicide about 2 weeks ago. She does not have a plan but feels the stress of her difficult family and financial situation is too much to [place]. She reports not taking any medications today including her antihypertensive medications.  Review of Systems  Constitutional: Positive for malaise/fatigue.  Respiratory: Negative for shortness of breath.   Cardiovascular: Positive for chest pain and leg swelling. Negative for palpitations and orthopnea.  Gastrointestinal: Positive for abdominal pain and melena. Negative for heartburn, nausea, vomiting and constipation.  Genitourinary: Positive for frequency. Negative for dysuria and urgency.  Musculoskeletal: Positive for back pain and joint pain. Negative for myalgias.  Neurological: Positive for headaches. Negative for dizziness and weakness.  Psychiatric/Behavioral: Positive for depression, suicidal ideas and memory loss. Negative for hallucinations and substance abuse. The patient has insomnia. The patient is not nervous/anxious.       Review of Systems  Constitutional: Positive for malaise/fatigue.  Respiratory: Negative for shortness of breath.   Cardiovascular: Positive for chest pain and leg swelling. Negative for palpitations and orthopnea.  Gastrointestinal:  Positive for abdominal pain and melena. Negative for heartburn, nausea, vomiting and constipation.  Genitourinary: Positive for frequency. Negative for dysuria and urgency.  Musculoskeletal: Positive for back pain and joint pain. Negative for myalgias.  Neurological: Positive for headaches. Negative for dizziness and weakness.  Psychiatric/Behavioral: Positive for depression, suicidal ideas and memory loss. Negative for hallucinations and substance abuse. The patient has insomnia. The patient is not nervous/anxious.     Objective:   Physical Exam  Constitutional: She is oriented to person, place, and time. She appears well-developed and well-nourished. No distress.       Obese   HENT:  Head: Normocephalic and atraumatic.  Cardiovascular: Normal rate, regular rhythm, normal heart sounds and intact distal pulses.   No murmur heard. Pulmonary/Chest: Effort normal and breath sounds normal.  Abdominal: Soft. Bowel sounds are normal. She exhibits no distension and no mass. There is no tenderness. There is no guarding.  Genitourinary: Guaiac negative stool.       External hemorroids present  Neurological: She is alert and oriented to person, place, and time.  Psychiatric: Her behavior is normal. Judgment and thought content normal.       Flat affect            Assessment & Plan:

## 2011-07-11 NOTE — Patient Instructions (Addendum)
It was a pleasure to speak with you today, [name].  As we agreed, if you are feeling like you want and will hurt yourself or anyone else, please call our office or go to the Emergency Room.  I understand that it has been difficult to stick to the 1800 diet due to your finances. You have lost 4 pounds since your last visit with portion control. We will make a change to your mood medications today.  We have prescribed Cymbalta and Seroquel.  It may take a while to get the best dosage for you but please continue to take it.  For your pain, take Tylenol 1000mg  (1gm) three times a day.  It is important that you no longer take the [facility] Powders.  They can cause many medical problems for you.  Please return to see me in 3-4 weeks.  We will discuss how things have been going and continue our conversation about Bariatric Surgery.

## 2011-07-11 NOTE — Assessment & Plan Note (Signed)
1. HgbA1c 9.1 today with capillary glucose in low 200's 2. Pt making effort at portion control versus 1800 calorie diabetic diet; lost 4 lbs since April visit. 3. Pt reports not taking medication today and there is a question of compliance with these medications. 4. Will continue Metformin 500mg  po bid with meals

## 2011-07-11 NOTE — Assessment & Plan Note (Signed)
1. Pt would like referral for Bariatric surgery since her exercise ability is limited by her "knee problems" 2. Further discussion deferred until her mood has stabilized

## 2011-07-11 NOTE — Progress Notes (Deleted)
  Subjective:    Patient ID: Debra Graham, female    DOB: Oct 26, 1956, 55 y.o.   MRN: [id number]  HPI    Review of Systems     Objective:   Physical Exam        Assessment & Plan:

## 2011-07-11 NOTE — Assessment & Plan Note (Signed)
1. Increased bp on this visit likely secondary to pt not taking medications today. 2. Will continue Amlodipine 10mg  po qd; Carvedilol 6.25mg  po bid

## 2011-08-01 ENCOUNTER — Encounter: Payer: Medicare Other | Admitting: Internal Medicine

## 2011-08-01 ENCOUNTER — Other Ambulatory Visit: Payer: Self-pay | Admitting: Internal Medicine

## 2011-08-01 NOTE — Telephone Encounter (Signed)
Last OV 8/22. CMP done on 6/26.

## 2011-08-10 LAB — COMPREHENSIVE METABOLIC PANEL
ALT: 20
Alkaline Phosphatase: 81
Chloride: 103
Glucose, Bld: 229 — ABNORMAL HIGH
Potassium: 3.3 — ABNORMAL LOW
Sodium: 141
Total Bilirubin: 0.6
Total Protein: 6.2

## 2011-08-10 LAB — CBC
HCT: 38.1
MCHC: 33.6
MCV: 82.4
MCV: 82.9
Platelets: 164
Platelets: 170
RDW: 15.4
RDW: 15.4
WBC: 8

## 2011-08-10 LAB — DIFFERENTIAL
Basophils Absolute: 0
Basophils Relative: 0
Eosinophils Absolute: 0.1
Neutro Abs: 9.4 — ABNORMAL HIGH
Neutrophils Relative %: 87 — ABNORMAL HIGH

## 2011-08-10 LAB — BASIC METABOLIC PANEL
BUN: 7
CO2: 28
Calcium: 8.9
Chloride: 106
Creatinine, Ser: 0.63
GFR calc Af Amer: >60
GFR calc non Af Amer: >60
Glucose, Bld: 143 — ABNORMAL HIGH
Potassium: 4.3
Sodium: 141

## 2011-08-10 LAB — URINALYSIS, ROUTINE W REFLEX MICROSCOPIC
Leukocytes, UA: NEGATIVE
Nitrite: NEGATIVE
Specific Gravity, Urine: 1.007
Urobilinogen, UA: 0.2
pH: 7

## 2011-08-10 LAB — LIPID PANEL
Cholesterol: 233 — ABNORMAL HIGH
HDL: 64
LDL Cholesterol: 150 — ABNORMAL HIGH
Total CHOL/HDL Ratio: 3.6
Triglycerides: 95
VLDL: 19

## 2011-08-10 LAB — URINE MICROSCOPIC-ADD ON

## 2011-08-10 LAB — POCT I-STAT 3, ART BLOOD GAS (G3+)
Acid-Base Excess: 5 — ABNORMAL HIGH
Bicarbonate: 29.3 — ABNORMAL HIGH
O2 Saturation: 97
Operator id: 287601
Patient temperature: 37
TCO2: 31
pCO2 arterial: 43.3
pH, Arterial: 7.439 — ABNORMAL HIGH
pO2, Arterial: 90

## 2011-08-10 LAB — I-STAT 8, (EC8 V) (CONVERTED LAB)
BUN: 7
Glucose, Bld: 232 — ABNORMAL HIGH
Hemoglobin: 14.6
Potassium: 3.2 — ABNORMAL LOW
Sodium: 140
TCO2: 28
pH, Ven: 7.382 — ABNORMAL HIGH

## 2011-08-10 LAB — CK TOTAL AND CKMB (NOT AT ARMC)
CK, MB: 2.1
Relative Index: INVALID
Total CK: 61

## 2011-08-10 LAB — POCT CARDIAC MARKERS
CKMB, poc: 1.9
Myoglobin, poc: 111
Operator id: 294501

## 2011-08-10 LAB — TSH: TSH: 0.722

## 2011-08-10 LAB — HEMOGLOBIN A1C: Hgb A1c MFr Bld: 7.3 — ABNORMAL HIGH

## 2011-08-10 LAB — CARDIAC PANEL(CRET KIN+CKTOT+MB+TROPI)
CK, MB: 1.9
Relative Index: INVALID
Relative Index: INVALID
Total CK: 56
Total CK: 73
Troponin I: 0.03

## 2011-08-10 LAB — RAPID URINE DRUG SCREEN, HOSP PERFORMED
Amphetamines: NOT DETECTED
Barbiturates: NOT DETECTED
Benzodiazepines: NOT DETECTED
Cocaine: POSITIVE — AB
Opiates: NOT DETECTED
Tetrahydrocannabinol: NOT DETECTED

## 2011-08-10 LAB — POCT I-STAT CREATININE: Creatinine, Ser: 0.7

## 2011-08-10 LAB — TROPONIN I: Troponin I: 0.04

## 2011-08-20 LAB — COMPREHENSIVE METABOLIC PANEL
AST: 11
AST: 17
Albumin: 3.4 — ABNORMAL LOW
CO2: 28
Calcium: 8.5
Calcium: 9.3
Chloride: 103
Creatinine, Ser: 0.67
Creatinine, Ser: 0.79
GFR calc Af Amer: >60
GFR calc Af Amer: >60
GFR calc non Af Amer: >60
Glucose, Bld: 227 — ABNORMAL HIGH
Total Protein: 7.3

## 2011-08-20 LAB — URINALYSIS, ROUTINE W REFLEX MICROSCOPIC
Glucose, UA: 100 — AB
Specific Gravity, Urine: >1.046 — ABNORMAL HIGH
pH: 6.5

## 2011-08-20 LAB — MAGNESIUM
Magnesium: 1.9
Magnesium: 1.9
Magnesium: 1.9

## 2011-08-20 LAB — GLUCOSE, CAPILLARY
Glucose-Capillary: 172 — ABNORMAL HIGH
Glucose-Capillary: 182 — ABNORMAL HIGH
Glucose-Capillary: 190 — ABNORMAL HIGH
Glucose-Capillary: 195 — ABNORMAL HIGH
Glucose-Capillary: 210 — ABNORMAL HIGH
Glucose-Capillary: 213 — ABNORMAL HIGH
Glucose-Capillary: 213 — ABNORMAL HIGH
Glucose-Capillary: 233 — ABNORMAL HIGH
Glucose-Capillary: 275 — ABNORMAL HIGH

## 2011-08-20 LAB — CBC
HCT: 35.8 — ABNORMAL LOW
Hemoglobin: 12.8
Hemoglobin: 13.8
MCHC: 33
MCHC: 33.1
MCHC: 33.8
MCHC: 34.5
MCV: 79.2
MCV: 80.2
MCV: 80.3
MCV: 81.4
Platelets: 174
Platelets: 192
RBC: 4.59
RBC: 4.65
RBC: 4.82
RDW: 14.7
RDW: 14.9
RDW: 14.9
WBC: 7.2
WBC: 7.5
WBC: 7.7

## 2011-08-20 LAB — TYPE AND SCREEN: ABO/RH(D): A POS

## 2011-08-20 LAB — BASIC METABOLIC PANEL
BUN: 5 — ABNORMAL LOW
BUN: 6
BUN: 6
CO2: 27
CO2: 27
CO2: 28
CO2: 28
CO2: 29
CO2: 31
Calcium: 9
Chloride: 105
Chloride: 106
Chloride: 106
Chloride: 107
Creatinine, Ser: 0.7
Creatinine, Ser: 0.7
GFR calc Af Amer: >60
GFR calc Af Amer: >60
GFR calc Af Amer: >60
Glucose, Bld: 189 — ABNORMAL HIGH
Glucose, Bld: 189 — ABNORMAL HIGH
Glucose, Bld: 207 — ABNORMAL HIGH
Potassium: 3.3 — ABNORMAL LOW
Potassium: 3.5
Potassium: 3.8
Potassium: 4
Sodium: 140
Sodium: 140
Sodium: 141
Sodium: 141
Sodium: 143

## 2011-08-20 LAB — LIPID PANEL
Cholesterol: 232 — ABNORMAL HIGH
HDL: 50
LDL Cholesterol: 162 — ABNORMAL HIGH
Triglycerides: 102

## 2011-08-20 LAB — DIFFERENTIAL
Basophils Absolute: 0
Basophils Relative: 0
Monocytes Absolute: 0.3
Neutro Abs: 6.3

## 2011-08-20 LAB — RAPID URINE DRUG SCREEN, HOSP PERFORMED
Amphetamines: NOT DETECTED
Barbiturates: NOT DETECTED

## 2011-08-20 LAB — POCT I-STAT, CHEM 8
Calcium, Ion: 1.04 — ABNORMAL LOW
Chloride: 104
HCT: 42
TCO2: 27

## 2011-08-20 LAB — CANCER ANTIGEN 19-9: CA 19-9: 6.7 — ABNORMAL LOW (ref <35.0)

## 2011-08-20 LAB — CEA: CEA: 8.5 — ABNORMAL HIGH

## 2011-08-20 LAB — ABO/RH: ABO/RH(D): A POS

## 2011-08-20 LAB — URINE MICROSCOPIC-ADD ON

## 2011-08-20 LAB — LIPASE, BLOOD: Lipase: 17

## 2011-08-20 LAB — DIGOXIN LEVEL: Digoxin Level: <0.2 — ABNORMAL LOW

## 2011-08-20 LAB — HEMOGLOBIN A1C: Hgb A1c MFr Bld: 8.1 — ABNORMAL HIGH

## 2011-08-21 LAB — CBC
HCT: 34.2 — ABNORMAL LOW
HCT: 34.7 — ABNORMAL LOW
Hemoglobin: 11.6 — ABNORMAL LOW
MCHC: 32.5
MCHC: 32.9
MCHC: 33.5
MCHC: 33.5
MCV: 81
MCV: 81.2
MCV: 81.6
Platelets: 235
Platelets: 281
RBC: 4.11
RDW: 14.9
RDW: 15.1
RDW: 15.4
WBC: 5
WBC: 8.6
WBC: 9.3

## 2011-08-21 LAB — GLUCOSE, CAPILLARY
Glucose-Capillary: 104 — ABNORMAL HIGH
Glucose-Capillary: 115 — ABNORMAL HIGH
Glucose-Capillary: 120 — ABNORMAL HIGH
Glucose-Capillary: 139 — ABNORMAL HIGH
Glucose-Capillary: 140 — ABNORMAL HIGH
Glucose-Capillary: 145 — ABNORMAL HIGH
Glucose-Capillary: 150 — ABNORMAL HIGH
Glucose-Capillary: 157 — ABNORMAL HIGH
Glucose-Capillary: 162 — ABNORMAL HIGH
Glucose-Capillary: 170 — ABNORMAL HIGH
Glucose-Capillary: 171 — ABNORMAL HIGH
Glucose-Capillary: 175 — ABNORMAL HIGH
Glucose-Capillary: 179 — ABNORMAL HIGH
Glucose-Capillary: 181 — ABNORMAL HIGH
Glucose-Capillary: 197 — ABNORMAL HIGH
Glucose-Capillary: 198 — ABNORMAL HIGH
Glucose-Capillary: 212 — ABNORMAL HIGH
Glucose-Capillary: 98

## 2011-08-21 LAB — BASIC METABOLIC PANEL
BUN: 2 — ABNORMAL LOW
BUN: 2 — ABNORMAL LOW
BUN: 3 — ABNORMAL LOW
BUN: 3 — ABNORMAL LOW
BUN: 4 — ABNORMAL LOW
CO2: 23
CO2: 25
CO2: 28
Calcium: 8.2 — ABNORMAL LOW
Calcium: 8.3 — ABNORMAL LOW
Calcium: 8.3 — ABNORMAL LOW
Chloride: 105
Chloride: 108
Creatinine, Ser: 0.64
Creatinine, Ser: 0.77
Creatinine, Ser: 0.81
GFR calc Af Amer: >60
GFR calc non Af Amer: >60
GFR calc non Af Amer: >60
Glucose, Bld: 170 — ABNORMAL HIGH
Glucose, Bld: 202 — ABNORMAL HIGH
Potassium: 3.7
Potassium: 3.8
Potassium: 3.8
Potassium: 4.3
Sodium: 137
Sodium: 137

## 2011-08-21 LAB — DIFFERENTIAL
Basophils Absolute: 0
Basophils Relative: 0
Eosinophils Absolute: 0.2
Monocytes Absolute: 0.5
Neutro Abs: 3.3
Neutrophils Relative %: 68

## 2011-08-21 LAB — MAGNESIUM: Magnesium: 1.7

## 2011-08-21 LAB — B-NATRIURETIC PEPTIDE (CONVERTED LAB): Pro B Natriuretic peptide (BNP): 231 — ABNORMAL HIGH

## 2011-08-22 ENCOUNTER — Encounter: Payer: Medicare Other | Admitting: Internal Medicine

## 2011-08-31 ENCOUNTER — Encounter: Payer: Self-pay | Admitting: Internal Medicine

## 2011-08-31 ENCOUNTER — Other Ambulatory Visit: Payer: Self-pay | Admitting: Internal Medicine

## 2011-08-31 NOTE — Progress Notes (Signed)
This encounter was created in error - please disregard.

## 2011-08-31 NOTE — Telephone Encounter (Signed)
Med not on med list but I cannot find where it was D/C'd. Pt has been getting rx from pharmacy. Will refill and ask PCP to readdress at next visit.

## 2011-08-31 NOTE — Telephone Encounter (Signed)
Med not on med list but I cannot find where it was D/C'd.

## 2011-09-26 ENCOUNTER — Encounter: Payer: Medicare Other | Admitting: Internal Medicine

## 2011-10-13 ENCOUNTER — Other Ambulatory Visit: Payer: Self-pay | Admitting: Internal Medicine

## 2011-10-15 ENCOUNTER — Telehealth: Payer: Self-pay | Admitting: Internal Medicine

## 2011-10-15 NOTE — Telephone Encounter (Signed)
Message sent to font office for an appt.

## 2011-10-15 NOTE — Telephone Encounter (Signed)
Please see routing message

## 2011-10-15 NOTE — Telephone Encounter (Signed)
She needs to come in soon for another clinic visit for evaluation of depression, DM, and htn. Last few were cancelled/no-show.

## 2011-11-07 ENCOUNTER — Encounter: Payer: Medicare Other | Admitting: Internal Medicine

## 2011-11-16 ENCOUNTER — Telehealth: Payer: Self-pay | Admitting: Oncology

## 2011-11-16 NOTE — Telephone Encounter (Signed)
lmonvm for pt re next appt for 1/11. Mailed jan/july schedule today.

## 2011-11-19 ENCOUNTER — Encounter (HOSPITAL_BASED_OUTPATIENT_CLINIC_OR_DEPARTMENT_OTHER): Payer: Self-pay | Admitting: *Deleted

## 2011-11-26 ENCOUNTER — Ambulatory Visit (HOSPITAL_BASED_OUTPATIENT_CLINIC_OR_DEPARTMENT_OTHER): Admission: RE | Admit: 2011-11-26 | Payer: Medicare Other | Source: Ambulatory Visit | Admitting: Oral Surgery

## 2011-11-26 ENCOUNTER — Encounter (HOSPITAL_BASED_OUTPATIENT_CLINIC_OR_DEPARTMENT_OTHER): Admission: RE | Payer: Self-pay | Source: Ambulatory Visit

## 2011-11-26 SURGERY — ALVEOPLASTY
Anesthesia: General

## 2011-11-30 ENCOUNTER — Other Ambulatory Visit: Payer: Medicare Other | Admitting: Lab

## 2011-11-30 ENCOUNTER — Ambulatory Visit: Payer: Medicare Other | Admitting: Oncology

## 2011-12-10 ENCOUNTER — Telehealth: Payer: Self-pay | Admitting: Oncology

## 2011-12-10 NOTE — Telephone Encounter (Signed)
pt called and r/s missed appt on 11-30-11 to 12-20-11

## 2011-12-20 ENCOUNTER — Other Ambulatory Visit: Payer: Medicare Other | Admitting: Lab

## 2011-12-20 ENCOUNTER — Ambulatory Visit: Payer: Medicare Other | Admitting: Oncology

## 2011-12-21 ENCOUNTER — Telehealth: Payer: Self-pay | Admitting: Oncology

## 2011-12-21 NOTE — Telephone Encounter (Signed)
pt came by as she missed her 1/31 appt and r/s to next avial per dr ha which is 2/7-printed    aom

## 2011-12-27 ENCOUNTER — Telehealth: Payer: Self-pay | Admitting: Oncology

## 2011-12-27 ENCOUNTER — Other Ambulatory Visit (HOSPITAL_BASED_OUTPATIENT_CLINIC_OR_DEPARTMENT_OTHER): Payer: Medicare Other | Admitting: Lab

## 2011-12-27 ENCOUNTER — Ambulatory Visit (HOSPITAL_BASED_OUTPATIENT_CLINIC_OR_DEPARTMENT_OTHER): Payer: Medicare Other | Admitting: Oncology

## 2011-12-27 VITALS — BP 198/112 | HR 96 | Temp 98.5°F | Ht 66.0 in | Wt 268.9 lb

## 2011-12-27 DIAGNOSIS — D649 Anemia, unspecified: Secondary | ICD-10-CM

## 2011-12-27 DIAGNOSIS — G589 Mononeuropathy, unspecified: Secondary | ICD-10-CM | POA: Diagnosis not present

## 2011-12-27 DIAGNOSIS — C189 Malignant neoplasm of colon, unspecified: Secondary | ICD-10-CM

## 2011-12-27 DIAGNOSIS — D696 Thrombocytopenia, unspecified: Secondary | ICD-10-CM

## 2011-12-27 DIAGNOSIS — D709 Neutropenia, unspecified: Secondary | ICD-10-CM

## 2011-12-27 DIAGNOSIS — C186 Malignant neoplasm of descending colon: Secondary | ICD-10-CM | POA: Diagnosis not present

## 2011-12-27 LAB — CBC WITH DIFFERENTIAL/PLATELET
Eosinophils Absolute: 0 10*3/uL (ref 0.0–0.5)
LYMPH%: 24.6 % (ref 14.0–49.7)
MONO#: 0.4 10*3/uL (ref 0.1–0.9)
NEUT#: 4.4 10*3/uL (ref 1.5–6.5)
Platelets: 179 10*3/uL (ref 145–400)
RBC: 4.4 10*6/uL (ref 3.70–5.45)
WBC: 6.5 10*3/uL (ref 3.9–10.3)
lymph#: 1.6 10*3/uL (ref 0.9–3.3)

## 2011-12-27 LAB — COMPREHENSIVE METABOLIC PANEL
Alkaline Phosphatase: 62 U/L (ref 39–117)
BUN: 10 mg/dL (ref 6–23)
Glucose, Bld: 217 mg/dL — ABNORMAL HIGH (ref 70–99)
Sodium: 141 mEq/L (ref 135–145)
Total Bilirubin: 0.4 mg/dL (ref 0.3–1.2)

## 2011-12-27 LAB — CEA: CEA: <0.5 ng/mL (ref 0.0–5.0)

## 2011-12-27 MED ORDER — SODIUM CHLORIDE 0.9 % IJ SOLN
10.0000 mL | INTRAMUSCULAR | Status: DC | PRN
  Filled 2011-12-27: qty 10

## 2011-12-27 MED ORDER — HEPARIN SOD (PORK) LOCK FLUSH 100 UNIT/ML IV SOLN
500.0000 [IU] | Freq: Once | INTRAVENOUS | Status: DC
  Filled 2011-12-27: qty 5

## 2011-12-27 NOTE — Progress Notes (Signed)
[facility] OFFICE PROGRESS NOTE  Cc:  Kristie Cowman, MD, MD  DIAGNOSIS:   History of pT3, N0, M0 adenocarcinoma of the distal colon with near colonic obstruction at presentation.  PAST THERAPY:  Colectomy with ileosigmoidostomy on 09/17/2008 by Dr. Johna Sheriff with pathology showing 7.9 cm poorly differentiated adenocarcinoma with the tumor invading through the peri-intestinal soft tissue, 0/29 lymph nodes positive, negative margins, positive lymphovascular invasion without perineural invasion.  She did not have full colonoscopy prior to resection of her tumor due to near obstructive presentation.  CURRENT THERAPY:  watchful observation  INTERVAL HISTORY: Debra Graham 56 y.o. female returns for regular follow up.  She has recently moved back to [place] from [place].  She has had chronic depression but without suicidal/homocidal ideation.  She does not exercise much and spends much of her awake time in sitting position. She has had bilateral pain in wrists.  She has intermittent, alternating diarrhea, constipation but has not noticed hematochezia, melena.  She has chronic fatigue but is independent of all activities of daily living. She has DOE from deconditioning, obesity, and also has baseline CHF.  However, she denies PND, orthopnea.  She has neuropathy that predated chemotherapy.  Her neuropathy is reportedly stable.  She has been off of her BP meds since her refill ran out and she has not seen her PCP lately.   Patient denies headache, visual changes, confusion, drenching night sweats, palpable lymph node swelling, mucositis, odynophagia, dysphagia, nausea vomiting, jaundice, chest pain, palpitation, gum bleeding, epistaxis, hematemesis, hemoptysis, abdominal pain, abdominal swelling, early satiety, melena, hematochezia, hematuria, skin rash, spontaneous bleeding, joint swelling, heat or cold intolerance, bowel bladder incontinence, back pain, focal motor weakness.     MEDICAL  HISTORY: Past Medical History  Diagnosis Date  . Osteoarthritis   . Diabetes mellitus   . Hyperlipidemia   . History of cocaine abuse   . Hypertension   . Adenocarcinoma, colon 11/09    s/p subtotal colectomy with primary anastamosis by Dr. Johna Sheriff; Chemotherapy per Dr. Twanna Hy  . Carpal tunnel syndrome   . Peripheral neuropathy   . CHF (congestive heart failure)     Cath 12/2005>>Nonischemic cardiomyopathy with mild coronary irregularities. done  by Dr. Riley Kill  . Depression      SURGICAL HISTORY:  Past Surgical History  Procedure Date  . Subtotal colectomy with ileosigmoidostomy 10/09    MEDICATIONS: Current Outpatient Prescriptions  Medication Sig Dispense Refill  . acetaminophen (TYLENOL) 500 MG tablet Take 2 tablets (1,000 mg total) by mouth 3 (three) times daily. For pain  90 tablet  3  . DULoxetine (CYMBALTA) 30 MG capsule Take 1 capsule (30 mg total) by mouth daily.  30 capsule  2  . amLODipine (NORVASC) 10 MG tablet TAKE 1 TABLET BY MOUTH EVERY DAY ** THIS REPLACES AMLODIPINE 5 MG**  30 tablet  6  . atorvastatin (LIPITOR) 40 MG tablet Take 1 tablet (40 mg total) by mouth daily.  30 tablet  11  . carvedilol (COREG) 6.25 MG tablet TAKE 1 TABLET BY MOUTH TWO TIMES A DAY  60 tablet  5  . furosemide (LASIX) 20 MG tablet TAKE 1 TABLET (20 MG TOTAL) BY MOUTH DAILY.  30 tablet  3  . lisinopril (PRINIVIL,ZESTRIL) 40 MG tablet TAKE 1 TABLET (40 MG TOTAL) BY MOUTH DAILY.  30 tablet  5  . metFORMIN (GLUCOPHAGE) 500 MG tablet Take 1 tablet (500 mg total) by mouth 2 (two) times daily with a meal.  60 tablet  11  .  nitroGLYCERIN (NITROSTAT) 0.4 MG SL tablet Place 0.4 mg under the tongue every 5 (five) minutes. For chest pain. If more than 2 doses needed, call 911.       [name] QUEtiapine (SEROQUEL) 50 MG tablet Take 1 tablet (50 mg total) by mouth at bedtime.  30 tablet  2  . traMADol (ULTRAM) 50 MG tablet Take 1 tablet (50 mg total) by mouth every 6 (six) hours as needed. For pain  90  tablet  1    ALLERGIES:   has no known allergies.  REVIEW OF SYSTEMS:  The rest of the 14-point review of system was negative.   Filed Vitals:   12/27/11 1042  BP: 198/112  Pulse: 96  Temp: 98.5 F (36.9 C)   Wt Readings from Last 3 Encounters:  12/27/11 268 lb 14.4 oz (121.972 kg)  07/11/11 282 lb (127.914 kg)  03/08/11 286 lb (129.729 kg)   ECOG Performance status: 1-2  PHYSICAL EXAMINATION:  General:  Obese woman in no acute distress.  Eyes:  no scleral icterus.  ENT:  There were no oropharyngeal lesions.  Neck was without thyromegaly.  Lymphatics:  Negative cervical, supraclavicular or axillary adenopathy.  Respiratory: lungs were clear bilaterally without wheezing or crackles.  Cardiovascular:  Regular rate and rhythm, S1/S2, without murmur, rub or gallop.  There was no pedal edema.  GI:  abdomen was soft, obese, nontender, nondistended, without organomegaly.  Muscoloskeletal:  no spinal tenderness of palpation of vertebral spine.  Skin exam was without echymosis, petichae.  Neuro exam was nonfocal.  Patient needed minimal assistance to get on and off exam table.   Gait was normal.  Patient was alerted and oriented.  Attention was good.   Language was appropriate.  Mood was normal without depression.  Speech was not pressured.  Thought content was not tangential.     LABORATORY/RADIOLOGY DATA:  Lab Results  Component Value Date   WBC 6.5 12/27/2011   HGB 12.8 12/27/2011   HCT 38.2 12/27/2011   PLT 179 12/27/2011   GLUCOSE 249* 05/15/2011   CHOL 220* 01/19/2011   TRIG 145 01/19/2011   HDL 71 01/19/2011   LDLCALC 045* 01/19/2011   ALT 11 01/24/2011   ALT 11 01/24/2011   AST 22 05/15/2011   NA 137 05/15/2011   K 4.9* 05/15/2011   CL 99 05/15/2011   CREATININE 0.7 05/15/2011   BUN 16 05/15/2011   CO2 25 05/15/2011   TSH 0.623 01/19/2011   INR 0.94 08/01/2010   HGBA1C 9.1 07/11/2011   MICROALBUR 331.17* 01/19/2011    ASSESSMENT AND PLAN:    1. History of colon cancer:  I discussed with Ms.  Schnepp today her clinical history, physical exam, and laboratory tests, there is no evidence for recurrence or metastatic disease.  She has an appointment for yearly CT of chest, abdomen, and pelvis per NCCN guidelines.  The next one is due in June of 2013 which I requested today.  Her next surveillance colonoscopy with Dr. Elnoria Howard is due about July of 2014.   2. Diabetes mellitus, type II:  She is supposed to be on metformin.  She has appointment with her PCP this month.  3. Neuropathy:  due to probably both diabetes mellitus type 2 and from history of chemotherapy.  She is off of Lyrica with stable symptoms.  4. Morbid obesity:  I strongly recommend patient again to have an exercise routine.  She has not been compliant with this.  I recommended her to see General  Surgery for gastric bypass.  She has not done this either.  5. Hypertension.  She is out of all of her BP meds.  I advised her to contact her PCP for refills.  6. Hyperlipidemia.  She is on atorvastatin 40 mg p.o. daily.   Follow up with me the day after restaging CT in June 2013.  She is about 2 years out from the diagnosis.  She has not been compliant with port flush appointment making it more likely to clot.  I referred her to IR to have portacath removal.

## 2011-12-27 NOTE — Telephone Encounter (Signed)
appts made for 6/3 and 6/7,contrast given and tina in ir to call with port remov appt   aom

## 2012-01-02 ENCOUNTER — Emergency Department (HOSPITAL_COMMUNITY): Payer: Medicare Other

## 2012-01-02 ENCOUNTER — Other Ambulatory Visit: Payer: Self-pay

## 2012-01-02 ENCOUNTER — Ambulatory Visit (HOSPITAL_COMMUNITY)
Admission: RE | Admit: 2012-01-02 | Discharge: 2012-01-02 | Disposition: A | Discharge status: Home / Self Care | Payer: Medicare Other | Source: Ambulatory Visit | Attending: Internal Medicine | Admitting: Internal Medicine

## 2012-01-02 ENCOUNTER — Other Ambulatory Visit (HOSPITAL_COMMUNITY): Payer: Medicare Other

## 2012-01-02 ENCOUNTER — Encounter (HOSPITAL_COMMUNITY): Payer: Self-pay | Admitting: Emergency Medicine

## 2012-01-02 ENCOUNTER — Inpatient Hospital Stay (HOSPITAL_COMMUNITY)
Admission: EM | Admit: 2012-01-02 | Discharge: 2012-01-08 | DRG: 280 | Disposition: A | Discharge status: Home / Self Care | Payer: Medicare Other | Source: Ambulatory Visit | Attending: Internal Medicine | Admitting: Internal Medicine

## 2012-01-02 ENCOUNTER — Ambulatory Visit (INDEPENDENT_AMBULATORY_CARE_PROVIDER_SITE_OTHER): Payer: Medicare Other | Admitting: Internal Medicine

## 2012-01-02 ENCOUNTER — Encounter: Payer: Self-pay | Admitting: Internal Medicine

## 2012-01-02 VITALS — BP 186/98 | HR 77 | Temp 98.7°F | Ht 66.0 in | Wt 271.1 lb

## 2012-01-02 DIAGNOSIS — I5023 Acute on chronic systolic (congestive) heart failure: Secondary | ICD-10-CM | POA: Diagnosis present

## 2012-01-02 DIAGNOSIS — J984 Other disorders of lung: Secondary | ICD-10-CM | POA: Diagnosis not present

## 2012-01-02 DIAGNOSIS — F17200 Nicotine dependence, unspecified, uncomplicated: Secondary | ICD-10-CM | POA: Diagnosis present

## 2012-01-02 DIAGNOSIS — Z5189 Encounter for other specified aftercare: Secondary | ICD-10-CM | POA: Diagnosis not present

## 2012-01-02 DIAGNOSIS — E119 Type 2 diabetes mellitus without complications: Secondary | ICD-10-CM | POA: Diagnosis not present

## 2012-01-02 DIAGNOSIS — F3289 Other specified depressive episodes: Secondary | ICD-10-CM

## 2012-01-02 DIAGNOSIS — K921 Melena: Secondary | ICD-10-CM

## 2012-01-02 DIAGNOSIS — I509 Heart failure, unspecified: Secondary | ICD-10-CM

## 2012-01-02 DIAGNOSIS — I428 Other cardiomyopathies: Secondary | ICD-10-CM | POA: Diagnosis not present

## 2012-01-02 DIAGNOSIS — C189 Malignant neoplasm of colon, unspecified: Secondary | ICD-10-CM

## 2012-01-02 DIAGNOSIS — Z794 Long term (current) use of insulin: Secondary | ICD-10-CM | POA: Diagnosis not present

## 2012-01-02 DIAGNOSIS — Z5987 Material hardship due to limited financial resources, not elsewhere classified: Secondary | ICD-10-CM

## 2012-01-02 DIAGNOSIS — I10 Essential (primary) hypertension: Secondary | ICD-10-CM | POA: Diagnosis not present

## 2012-01-02 DIAGNOSIS — I214 Non-ST elevation (NSTEMI) myocardial infarction: Secondary | ICD-10-CM | POA: Diagnosis not present

## 2012-01-02 DIAGNOSIS — E785 Hyperlipidemia, unspecified: Secondary | ICD-10-CM

## 2012-01-02 DIAGNOSIS — Z598 Other problems related to housing and economic circumstances: Secondary | ICD-10-CM

## 2012-01-02 DIAGNOSIS — R058 Other specified cough: Secondary | ICD-10-CM

## 2012-01-02 DIAGNOSIS — R079 Chest pain, unspecified: Secondary | ICD-10-CM | POA: Diagnosis present

## 2012-01-02 DIAGNOSIS — R9431 Abnormal electrocardiogram [ECG] [EKG]: Secondary | ICD-10-CM | POA: Insufficient documentation

## 2012-01-02 DIAGNOSIS — F329 Major depressive disorder, single episode, unspecified: Secondary | ICD-10-CM

## 2012-01-02 DIAGNOSIS — R0789 Other chest pain: Secondary | ICD-10-CM | POA: Diagnosis not present

## 2012-01-02 DIAGNOSIS — Z85038 Personal history of other malignant neoplasm of large intestine: Secondary | ICD-10-CM

## 2012-01-02 DIAGNOSIS — Z79899 Other long term (current) drug therapy: Secondary | ICD-10-CM

## 2012-01-02 DIAGNOSIS — R0602 Shortness of breath: Secondary | ICD-10-CM | POA: Diagnosis not present

## 2012-01-02 DIAGNOSIS — R05 Cough: Secondary | ICD-10-CM

## 2012-01-02 DIAGNOSIS — E669 Obesity, unspecified: Secondary | ICD-10-CM

## 2012-01-02 DIAGNOSIS — I5022 Chronic systolic (congestive) heart failure: Secondary | ICD-10-CM | POA: Diagnosis present

## 2012-01-02 DIAGNOSIS — E11319 Type 2 diabetes mellitus with unspecified diabetic retinopathy without macular edema: Secondary | ICD-10-CM | POA: Diagnosis present

## 2012-01-02 LAB — DIFFERENTIAL
Basophils Relative: 0 % (ref 0–1)
Eosinophils Absolute: 0.1 10*3/uL (ref 0.0–0.7)
Lymphs Abs: 2.2 10*3/uL (ref 0.7–4.0)
Neutro Abs: 5.3 10*3/uL (ref 1.7–7.7)
Neutrophils Relative %: 67 % (ref 43–77)

## 2012-01-02 LAB — CBC
Hemoglobin: 12.5 g/dL (ref 12.0–15.0)
MCH: 28.5 pg (ref 26.0–34.0)
Platelets: 165 10*3/uL (ref 150–400)
RBC: 4.38 MIL/uL (ref 3.87–5.11)

## 2012-01-02 LAB — TROPONIN I
Troponin I: <0.3 ng/mL (ref <0.30)
Troponin I: 0.38 ng/mL (ref <0.30)

## 2012-01-02 LAB — COMPREHENSIVE METABOLIC PANEL
ALT: 12 U/L (ref 0–35)
Albumin: 2.9 g/dL — ABNORMAL LOW (ref 3.5–5.2)
Alkaline Phosphatase: 74 U/L (ref 39–117)
Chloride: 106 mEq/L (ref 96–112)
Glucose, Bld: 200 mg/dL — ABNORMAL HIGH (ref 70–99)
Potassium: 3.7 mEq/L (ref 3.5–5.1)
Sodium: 141 mEq/L (ref 135–145)
Total Bilirubin: 0.3 mg/dL (ref 0.3–1.2)
Total Protein: 6.6 g/dL (ref 6.0–8.3)

## 2012-01-02 LAB — GLUCOSE, CAPILLARY: Glucose-Capillary: 223 mg/dL — ABNORMAL HIGH (ref 70–99)

## 2012-01-02 MED ORDER — MORPHINE SULFATE 2 MG/ML IJ SOLN: 2.0000 mg | INTRAMUSCULAR | Status: DC | PRN

## 2012-01-02 MED ORDER — ACETAMINOPHEN 325 MG PO TABS
650.0000 mg | ORAL_TABLET | ORAL | Status: DC | PRN
  Administered 2012-01-03 (×2): 650 mg via ORAL
  Filled 2012-01-02 (×2): qty 2

## 2012-01-02 MED ORDER — ATORVASTATIN CALCIUM 40 MG PO TABS: 40.0000 mg | ORAL_TABLET | Freq: Every day | ORAL | Status: DC

## 2012-01-02 MED ORDER — ASPIRIN 81 MG PO CHEW
324.0000 mg | CHEWABLE_TABLET | Freq: Once | ORAL | Status: AC
  Administered 2012-01-02: 324 mg via ORAL
  Filled 2012-01-02: qty 4

## 2012-01-02 MED ORDER — INSULIN ASPART 100 UNIT/ML ~~LOC~~ SOLN
0.0000 [IU] | Freq: Three times a day (TID) | SUBCUTANEOUS | Status: DC
  Administered 2012-01-03 – 2012-01-04 (×4): 3 [IU] via SUBCUTANEOUS
  Administered 2012-01-04: 2 [IU] via SUBCUTANEOUS
  Administered 2012-01-04: 1 [IU] via SUBCUTANEOUS
  Administered 2012-01-05: 5 [IU] via SUBCUTANEOUS
  Administered 2012-01-05 (×2): 2 [IU] via SUBCUTANEOUS
  Filled 2012-01-02 (×2): qty 3

## 2012-01-02 MED ORDER — METFORMIN HCL 500 MG PO TABS: 500.0000 mg | ORAL_TABLET | Freq: Two times a day (BID) | ORAL | Status: DC

## 2012-01-02 MED ORDER — AMLODIPINE BESYLATE 10 MG PO TABS
10.0000 mg | ORAL_TABLET | Freq: Every day | ORAL | Status: DC
  Administered 2012-01-03 – 2012-01-08 (×6): 10 mg via ORAL
  Filled 2012-01-02 (×6): qty 1

## 2012-01-02 MED ORDER — DEXTROSE 5 % IV SOLN
500.0000 mg | Freq: Once | INTRAVENOUS | Status: AC
  Administered 2012-01-02: 500 mg via INTRAVENOUS
  Filled 2012-01-02: qty 500

## 2012-01-02 MED ORDER — FUROSEMIDE 20 MG PO TABS: 20.0000 mg | ORAL_TABLET | Freq: Every day | ORAL | Status: DC

## 2012-01-02 MED ORDER — SODIUM CHLORIDE 0.9 % IJ SOLN
3.0000 mL | Freq: Two times a day (BID) | INTRAMUSCULAR | Status: DC
  Administered 2012-01-04 – 2012-01-06 (×5): 3 mL via INTRAVENOUS

## 2012-01-02 MED ORDER — NITROGLYCERIN 0.4 MG SL SUBL: 0.4000 mg | SUBLINGUAL_TABLET | SUBLINGUAL | Status: DC

## 2012-01-02 MED ORDER — GUAIFENESIN ER 600 MG PO TB12: 600.0000 mg | ORAL_TABLET | Freq: Two times a day (BID) | ORAL | Status: DC

## 2012-01-02 MED ORDER — GUAIFENESIN ER 600 MG PO TB12
600.0000 mg | ORAL_TABLET | Freq: Two times a day (BID) | ORAL | Status: DC
  Administered 2012-01-02 – 2012-01-08 (×12): 600 mg via ORAL
  Filled 2012-01-02 (×14): qty 1

## 2012-01-02 MED ORDER — SODIUM CHLORIDE 0.9 % IV SOLN: 250.0000 mL | INTRAVENOUS | Status: DC | PRN

## 2012-01-02 MED ORDER — CARVEDILOL 6.25 MG PO TABS
6.2500 mg | ORAL_TABLET | Freq: Two times a day (BID) | ORAL | Status: DC
  Filled 2012-01-02 (×3): qty 1

## 2012-01-02 MED ORDER — LISINOPRIL 40 MG PO TABS
40.0000 mg | ORAL_TABLET | Freq: Every day | ORAL | Status: DC
  Administered 2012-01-03 – 2012-01-08 (×6): 40 mg via ORAL
  Filled 2012-01-02 (×6): qty 1

## 2012-01-02 MED ORDER — DEXTROSE 5 % IV SOLN
1.0000 g | INTRAVENOUS | Status: DC
  Filled 2012-01-02: qty 10

## 2012-01-02 MED ORDER — QUETIAPINE FUMARATE 50 MG PO TABS: 50.0000 mg | ORAL_TABLET | Freq: Every day | ORAL | Status: DC

## 2012-01-02 MED ORDER — LISINOPRIL 40 MG PO TABS: 40.0000 mg | ORAL_TABLET | Freq: Every day | ORAL | Status: DC

## 2012-01-02 MED ORDER — ROSUVASTATIN CALCIUM 40 MG PO TABS
40.0000 mg | ORAL_TABLET | Freq: Every day | ORAL | Status: DC
  Administered 2012-01-03 – 2012-01-08 (×6): 40 mg via ORAL
  Filled 2012-01-02 (×6): qty 1

## 2012-01-02 MED ORDER — NITROGLYCERIN 0.4 MG SL SUBL: 0.4000 mg | SUBLINGUAL_TABLET | SUBLINGUAL | Status: DC | PRN

## 2012-01-02 MED ORDER — DEXTROSE 5 % IV SOLN
1.0000 g | Freq: Once | INTRAVENOUS | Status: AC
  Administered 2012-01-02: 1 g via INTRAVENOUS
  Filled 2012-01-02: qty 10

## 2012-01-02 MED ORDER — CARVEDILOL 6.25 MG PO TABS: 6.2500 mg | ORAL_TABLET | Freq: Two times a day (BID) | ORAL | Status: DC

## 2012-01-02 MED ORDER — AMLODIPINE BESYLATE 10 MG PO TABS: 10.0000 mg | ORAL_TABLET | Freq: Every day | ORAL | Status: DC

## 2012-01-02 MED ORDER — DULOXETINE HCL 30 MG PO CPEP: 30.0000 mg | ORAL_CAPSULE | Freq: Every day | ORAL | Status: DC

## 2012-01-02 MED ORDER — FUROSEMIDE 20 MG PO TABS
20.0000 mg | ORAL_TABLET | Freq: Every day | ORAL | Status: DC
  Administered 2012-01-03: 20 mg via ORAL
  Filled 2012-01-02 (×2): qty 1

## 2012-01-02 MED ORDER — DEXTROSE 5 % IV SOLN
500.0000 mg | INTRAVENOUS | Status: DC
  Filled 2012-01-02: qty 500

## 2012-01-02 MED ORDER — HEPARIN BOLUS VIA INFUSION
4000.0000 [IU] | Freq: Once | INTRAVENOUS | Status: AC
  Administered 2012-01-02: 4000 [IU] via INTRAVENOUS

## 2012-01-02 MED ORDER — HEPARIN SOD (PORCINE) IN D5W 100 UNIT/ML IV SOLN
2100.0000 [IU]/h | INTRAVENOUS | Status: DC
  Administered 2012-01-02: 1100 [IU]/h via INTRAVENOUS
  Administered 2012-01-03: 1500 [IU]/h via INTRAVENOUS
  Administered 2012-01-04 (×2): 2100 [IU]/h via INTRAVENOUS
  Filled 2012-01-02 (×6): qty 250

## 2012-01-02 MED ORDER — DULOXETINE HCL 60 MG PO CPEP
60.0000 mg | ORAL_CAPSULE | Freq: Every day | ORAL | Status: DC
  Administered 2012-01-03 – 2012-01-08 (×6): 60 mg via ORAL
  Filled 2012-01-02 (×6): qty 1

## 2012-01-02 MED ORDER — DULOXETINE HCL 30 MG PO CPEP: 60.0000 mg | ORAL_CAPSULE | Freq: Every day | ORAL | Status: DC

## 2012-01-02 MED ORDER — ACETAMINOPHEN 500 MG PO TABS: 1000.0000 mg | ORAL_TABLET | Freq: Three times a day (TID) | ORAL | Status: DC

## 2012-01-02 MED ORDER — ONDANSETRON HCL 4 MG/2ML IJ SOLN: 4.0000 mg | Freq: Four times a day (QID) | INTRAMUSCULAR | Status: DC | PRN

## 2012-01-02 MED ORDER — TRAMADOL HCL 50 MG PO TABS: 50.0000 mg | ORAL_TABLET | Freq: Four times a day (QID) | ORAL | Status: DC | PRN

## 2012-01-02 MED ORDER — SODIUM CHLORIDE 0.9 % IJ SOLN
3.0000 mL | INTRAMUSCULAR | Status: DC | PRN
  Administered 2012-01-06: 3 mL via INTRAVENOUS

## 2012-01-02 MED ORDER — ASPIRIN EC 81 MG PO TBEC
81.0000 mg | DELAYED_RELEASE_TABLET | Freq: Every day | ORAL | Status: DC
  Administered 2012-01-03 – 2012-01-08 (×6): 81 mg via ORAL
  Filled 2012-01-02 (×6): qty 1

## 2012-01-02 NOTE — ED Notes (Signed)
Started to have cp on the way to clinic today went on to appt and they did ekg and pt was sent her for further tests poss changes from old one

## 2012-01-02 NOTE — ED Notes (Signed)
Lab informed RN of Troponin critical value. MD informed

## 2012-01-02 NOTE — Assessment & Plan Note (Signed)
Poor control with hemoglobin A1c today of 9.9 patient has not been taking her metformin.

## 2012-01-02 NOTE — Assessment & Plan Note (Signed)
Poor control with most recent lipid panel March 2012 demonstrated cholesterol 220, LDL 120, triglyceride 145, HDL 77. Will re-prescribe scratches statin therapy.

## 2012-01-02 NOTE — ED Notes (Signed)
Pt had ekg down stairs in clinic today before coming to has been out of meds x 2-3 days

## 2012-01-02 NOTE — ED Provider Notes (Signed)
History     CSN: [id number]  Arrival date & time 01/02/12  1456   First MD Initiated Contact with Patient 01/02/12 1738      Chief Complaint  Patient presents with  . Chest Pain    (Consider location/radiation/quality/duration/timing/severity/associated sxs/prior treatment) HPI Patient is a 56 yo [place] American female with a history of CHF, DM, and adenocarcinoma of the colon, presents to the ED today after being seen in the outpatient clinic with complaints of chest pain and possible EKG changes. Patient reports that she went to her appointment today as scheduled and mentioned to her physician that she has been having chest pain over the past few weeks that is dull and achy, but also sharp and moves around her chest. Today she point to the right side of her chest when speaking of the chest pain. Patient reports that she has had a cold for the past 3-4 weeks.  Patient says that she is used to having shortness of breath and intermittent chest pain because of her CHF. Patient denies fevers, chills, sweats, N/V/D, or abdominal pain. Patient has no increased swelling in lower extremities. Patient has no known allergies.  Past Medical History  Diagnosis Date  . Osteoarthritis   . Diabetes mellitus   . Hyperlipidemia   . History of cocaine abuse   . Hypertension   . Adenocarcinoma, colon 11/09    s/p subtotal colectomy with primary anastamosis by Dr. Johna Sheriff; Chemotherapy per Dr. Twanna Hy  . Carpal tunnel syndrome   . Peripheral neuropathy   . CHF (congestive heart failure)     Cath 12/2005>>Nonischemic cardiomyopathy with mild coronary irregularities. done  by Dr. Riley Kill  . Depression     Past Surgical History  Procedure Date  . Subtotal colectomy with ileosigmoidostomy 10/09    No family history on file.  History  Substance Use Topics  . Smoking status: Current Everyday Smoker -- 0.4 packs/day    Types: Cigarettes  . Smokeless tobacco: Not on file   Comment: Not ready as of  yet.  Is decreasing amounts.  . Alcohol Use: Yes    OB History    Grav Para Term Preterm Abortions TAB SAB Ect Mult Living                  Review of Systems All pertinent positives and negatives reviewed in the history of present illness  Allergies  Review of patient's allergies indicates no known allergies.  Home Medications   Current Outpatient Rx  Name Route Sig Dispense Refill  . AMLODIPINE BESYLATE 10 MG PO TABS Oral Take 1 tablet (10 mg total) by mouth daily. 30 tablet 6  . ATORVASTATIN CALCIUM 40 MG PO TABS Oral Take 1 tablet (40 mg total) by mouth daily. 30 tablet 11  . CARVEDILOL 6.25 MG PO TABS Oral Take 1 tablet (6.25 mg total) by mouth 2 (two) times daily with a meal. 60 tablet 5  . FUROSEMIDE 20 MG PO TABS Oral Take 1 tablet (20 mg total) by mouth daily. 30 tablet 3  . LISINOPRIL 40 MG PO TABS Oral Take 1 tablet (40 mg total) by mouth daily. 30 tablet 5  . METFORMIN HCL 500 MG PO TABS Oral Take 1 tablet (500 mg total) by mouth 2 (two) times daily with a meal. 60 tablet 11  . NITROGLYCERIN 0.4 MG SL SUBL Sublingual Place 1 tablet (0.4 mg total) under the tongue every 5 (five) minutes. For chest pain. If more than 2 doses needed, call  [phone]. 15 tablet 3  . QUETIAPINE FUMARATE 50 MG PO TABS Oral Take 50 mg by mouth at bedtime.    . TRAMADOL HCL 50 MG PO TABS Oral Take 1 tablet (50 mg total) by mouth every 6 (six) hours as needed. For pain 90 tablet 1  . TRAZODONE HCL 50 MG PO TABS Oral Take 50 mg by mouth at bedtime as needed. As needed for sleep.    . DULOXETINE HCL 30 MG PO CPEP Oral Take 2 capsules (60 mg total) by mouth daily. 30 capsule 2  . GUAIFENESIN ER 600 MG PO TB12 Oral Take 1 tablet (600 mg total) by mouth 2 (two) times daily. 60 tablet 0    BP 177/75  Pulse 68  Temp 98.6 F (37 C)  Resp 17  SpO2 100%  Physical Exam  Constitutional: She is oriented to person, place, and time. She appears well-developed and well-nourished. No distress.  HENT:  Head:  Normocephalic and atraumatic.  Eyes: Pupils are equal, round, and reactive to light.  Neck: Normal range of motion. Neck supple.  Cardiovascular: Normal rate, regular rhythm and normal heart sounds.   Pulmonary/Chest: Effort normal and breath sounds normal.  Abdominal: Soft. Bowel sounds are normal. She exhibits no distension and no mass. There is no tenderness. There is no rebound and no guarding.  Lymphadenopathy:    She has no cervical adenopathy.  Neurological: She is alert and oriented to person, place, and time.  Skin: Skin is warm and dry. She is not diaphoretic.    ED Course  Procedures (including critical care time)  Labs Reviewed  COMPREHENSIVE METABOLIC PANEL - Abnormal; Notable for the following:    Glucose, Bld 200 (*)    Albumin 2.9 (*)    All other components within normal limits  TROPONIN I  CBC  DIFFERENTIAL  TROPONIN I   Dg Chest 2 View  01/02/2012  *RADIOLOGY REPORT*  Clinical Data: Chest pain, abnormal EKG, shortness of breath.  CHEST - 2 VIEW  Comparison: CT chest 05/15/2011 and chest radiograph 08/01/2010.  Findings: Trachea is midline.  Heart is enlarged.  Right IJ power port tip projects over the SVC.  Linear scarring is seen in the left lung base. There may be new air space disease in the right infrahilar region.  No pleural fluid.  IMPRESSION: Question new air space disease in the right infrahilar region. Follow-up to clearing is recommended.  Original Report Authenticated By: Reyes Ivan, M.D.     Patient has had URI symptoms over the last 2 weeks.  Based on the findings on her chest x-ray went ahead and gave community acquired pneumonia antibiotics.    MDM   Date: 01/02/2012  Rate: 70  Rhythm: normal sinus rhythm  QRS Axis: normal  Intervals: normal  ST/T Wave abnormalities: normal and nonspecific ST/T changes  Conduction Disutrbances:first-degree A-V block   Narrative Interpretation:   Old EKG Reviewed: unchanged   MDM Reviewed:  nursing note, vitals and previous chart Reviewed previous: labs, ECG and x-ray Interpretation: labs, ECG and x-ray Consults: admitting MD             Carlyle Dolly, PA-C 01/02/12 2001  Jamesetta Orleans Makenzi Bannister, PA-C 01/02/12 2002

## 2012-01-02 NOTE — ED Provider Notes (Signed)
Shortness of breath and chest discomfort earlier today seen at [facility] outpatient clinic sent here for further evaluation patient presently asymptomatic  Doug Sou, MD 01/03/12 [phone]

## 2012-01-02 NOTE — H&P (Addendum)
Hospital Admission Note Date: 01/03/2012  Patient name: Debra Graham Medical record number: [id number] Date of birth: [birth year] Age: 56 y.o. Gender: female PCP: Kristie Cowman, MD, MD  Medical Service: [facility] internal medicine teaching service  Attending physician: Dr. [name]    1st Contact: Dr. [name]    Pager: [phone] 2nd Contact: Dr. Lars Mage    Pager: [phone]  After 5 pm or weekends: 1st Contact:      Pager: [phone] 2nd Contact:      Pager: [phone]  Chief Complaint: Chest pain  History of Present Illness: This is a 56 year old female with PMH of CHF, DM, HTN, HLD, medical noncompliance and colon cancer with S./P. subtotal colectomy in 2009 presents with chest pain.  Patient states that chest pain and chest pressure this afternoon. Her chest pain is located at right side and mid-sternum of her chest, 5/10, constant, dull/pressure like pain with radiation to her shoulder blades.  Patient reports that she has had chronic migraine headache likely due to uncontrolled blood pressure for 3-4 weeks, which flared up today and she did not pay attention to her chest pain until her migraine subsided after BC powder.  Patient came to [facility] clinic and was evaluated by Dr. Kristie Cowman (refer to Dr. Bosie Clos note) who found that patient had some EKG changes and sent her to ED for further evaluation. Patient was noted to have "Question new airspace disease in the right infrahilar region." in ED  Denies fever, or sore throat. No shortness of breath or dyspnea on exertion. No palpitation.  No nausea or vomiting.  No melena or incontinence. No muscle weakness.   Denies depression. No appetite or weight changes.    Of note, patient has had chronic intermittent sharp/shooting chest pain since 2007 when she was diagnosed with CHF. Her intermittent chest pain usually last for seconds and relieved with nitroglycerin. She has not seen a cardiologist for years.   Patient also endorses  "cold-like"symptoms for over two month with mild whitish/greenish sputum accompanied with nasal congestion.  Patient took some over-the-counter cold medication and did not seek any medical attention.  Medication List  As of 01/03/2012  6:37 AM   ASK your doctor about these medications         amLODipine 10 MG tablet   Commonly known as: NORVASC   Take 1 tablet (10 mg total) by mouth daily.      atorvastatin 40 MG tablet   Commonly known as: LIPITOR   Take 1 tablet (40 mg total) by mouth daily.      carvedilol 6.25 MG tablet   Commonly known as: COREG   Take 1 tablet (6.25 mg total) by mouth 2 (two) times daily with a meal.      DULoxetine 30 MG capsule   Commonly known as: CYMBALTA   Take 2 capsules (60 mg total) by mouth daily.      furosemide 20 MG tablet   Commonly known as: LASIX   Take 1 tablet (20 mg total) by mouth daily.      guaiFENesin 600 MG 12 hr tablet   Commonly known as: MUCINEX   Take 1 tablet (600 mg total) by mouth 2 (two) times daily.      lisinopril 40 MG tablet   Commonly known as: PRINIVIL,ZESTRIL   Take 1 tablet (40 mg total) by mouth daily.      metFORMIN 500 MG tablet   Commonly known as: GLUCOPHAGE   Take 1 tablet (500 mg  total) by mouth 2 (two) times daily with a meal.      nitroGLYCERIN 0.4 MG SL tablet   Commonly known as: NITROSTAT   Place 1 tablet (0.4 mg total) under the tongue every 5 (five) minutes. For chest pain. If more than 2 doses needed, call 911.      QUEtiapine 50 MG tablet   Commonly known as: SEROQUEL   Take 50 mg by mouth at bedtime.      traMADol 50 MG tablet   Commonly known as: ULTRAM   Take 1 tablet (50 mg total) by mouth every 6 (six) hours as needed. For pain      traZODone 50 MG tablet   Commonly known as: DESYREL   Take 50 mg by mouth at bedtime as needed. As needed for sleep.            Allergies: Review of patient's allergies indicates no known allergies. Past Medical History  Diagnosis Date  .  Osteoarthritis   . Diabetes mellitus   . Hyperlipidemia   . History of cocaine abuse   . Hypertension   . Adenocarcinoma, colon 11/09    s/p subtotal colectomy with primary anastamosis by Dr. Johna Sheriff; Chemotherapy per Dr. Twanna Hy   . patient also endorses chronic intermittent abdominal aching and loose stools a/c with her colon cancer and Sx.   . Carpal tunnel syndrome   . Peripheral neuropathy   . CHF (congestive heart failure)     Cath 12/2005>>Nonischemic cardiomyopathy with mild coronary irregularities. done  by Dr. Riley Kill, repeated 2D echo in 2011  showed EF 55-60%.  . Depression    Past Surgical History  Procedure Date  . Subtotal colectomy with ileosigmoidostomy 10/09   Family History  Problem Relation Age of Onset  . Breast cancer Father   . Hypertension Mother   . Heart disease Mother   . Heart attack Brother     All siblings passed away  . Cancer      Mother's side of family   History   Social History  . Marital Status:  widowed, husband passed away with brain cancer     Spouse Name: N/A    Number of Children: N/A  . Years of Education: N/A   Occupational History  . Not on file.   Social History Main Topics  . Smoking status: Current Everyday Smoker -- 0.4-2 packs/day for 20 years    Types: Cigarettes  . Smokeless tobacco: Not on file   Comment: Not ready as of yet.  Is decreasing amounts.  . Alcohol Use: Yes  . Drug Use: Not on file  . Sexually Active: Not on file   Other Topics Concern  . Not on file   Social History Narrative  . Lives with her kids. On disability.     Review of Systems: See above HPI  Physical Exam: Blood pressure 159/86, pulse 64, temperature 98.7 F (37.1 C), temperature source Oral, resp. rate 18, height 5\' 6"  (1.676 m), weight 268 lb 4.8 oz (121.7 kg), SpO2 98.00%. General: alert, well-developed, and cooperative to examination.  Head: normocephalic and atraumatic.  Eyes: vision grossly intact, pupils equal, pupils round,  pupils reactive to light, no injection and anicteric.  Mouth: pharynx pink and moist, no erythema, and no exudates.  Neck: supple, full ROM, no thyromegaly, no JVD, and no carotid bruits.  Lungs: normal respiratory effort, no accessory muscle use,B/L lung sounds diminished, no crackles, and no wheezes. Heart: normal rate, regular rhythm, no murmur, no  gallop, and no rub.  Abdomen:central obesity, soft, non-tender, normal bowel sounds, no distention, no guarding, no rebound tenderness, no hepatomegaly, and no splenomegaly.  Msk: no joint swelling, no joint warmth, and no redness over joints.  Pulses: 1+ DP/PT pulses bilaterally Extremities: No cyanosis, clubbing, edema Neurologic: alert & oriented X3, cranial nerves II-XII intact, strength normal in all extremities, sensation intact to light touch, and gait normal.  Skin: turgor normal and no rashes.  Psych: Oriented X3, memory intact for recent and remote, normally interactive, good eye contact, not anxious appearing, and not depressed appearing.    Lab results: Basic Metabolic Panel:  [facility] 01/02/12 1524  NA 141  K 3.7  CL 106  CO2 26  GLUCOSE 200*  BUN 15  CREATININE 0.70  CALCIUM 9.2  MG --  PHOS --   Liver Function Tests:  Basename 01/02/12 1524  AST 15  ALT 12  ALKPHOS 74  BILITOT 0.3  PROT 6.6  ALBUMIN 2.9*   CBC:  Basename 01/03/12 0445 01/02/12 1524  WBC 6.4 8.0  NEUTROABS -- 5.3  HGB 11.7* 12.5  HCT 35.2* 37.6  MCV 85.9 85.8  PLT 136* 165   Cardiac Enzymes:  Basename 01/02/12 2315 01/02/12 1915 01/02/12 1524  CKTOTAL 51 -- --  CKMB 2.0 -- --  CKMBINDEX -- -- --  TROPONINI <0.30 0.38* <0.30  CBG:  Basename 01/03/12 0545 01/02/12 1353  GLUCAP 206* 223*   Hemoglobin A1C:  Basename 01/02/12 1412  HGBA1C 9.9   Urine Drug Screen: Drugs of Abuse     Component Value Date/Time   LABOPIA NONE DETECTED 01/03/2012 0502   COCAINSCRNUR NONE DETECTED 01/03/2012 0502   LABBENZ NONE DETECTED 01/03/2012  0502   AMPHETMU NONE DETECTED 01/03/2012 0502   THCU NONE DETECTED 01/03/2012 0502   LABBARB NONE DETECTED 01/03/2012 0502     Imaging results:  Dg Chest 2 View  01/02/2012  *RADIOLOGY REPORT*  Clinical Data: Chest pain, abnormal EKG, shortness of breath.  CHEST - 2 VIEW  Comparison: CT chest 05/15/2011 and chest radiograph 08/01/2010.  Findings: Trachea is midline.  Heart is enlarged.  Right IJ power port tip projects over the SVC.  Linear scarring is seen in the left lung base. There may be new air space disease in the right infrahilar region.  No pleural fluid.  IMPRESSION: Question new air space disease in the right infrahilar region. Follow-up to clearing is recommended.  Original Report Authenticated By: Reyes Ivan, M.D.    Other results: EKG: NSR with 1st degree AVB.            ST depression in lateral leads and nonspecific ST-T wave changes in other leads, this is unchanged from prior exam. Assessment & Plan by Problem:   # NSTEMI - Patient presents with chest pain that started today and characteristic of her CP is different from her hx of chronic intermittent chest pain. She has no significant history of CAD, with last catheterization done on 12/2005 which showed mild disease with 30% stenosis of ramus intermedius and PDA. Last 2-D echo was 9/11 which showed EF 55-60%. EKG today showed: ST depression in lateral leads and nonspecific ST-T wave changes in other leads, this is unchanged from prior exam.  Her second Troponin in ED was elevated at 0.38.  Differentials for her CP also include Angina/ACS/Demand ischemia secondary to pneumonia given her recent cold like symptoms and CXR finding, other less likely differentials include MSK, GERD, Anxiety, PE.   Given the patient's  history and presentation and abnormal Troponin, patient most likely is having NSTEMI which could be related to demand ischemia.   Plan: -Will admit to TELE -Cycle CE x3 - consider cardiology consult if  Troponins continue to increase -Check 2D Echo to evaluate progression of ?cardiomyopathy -Will start Heparin, BBlocker, Full dose ASA, Statin, nitroglycerine, morphine IVF and oxygen.   # PNA - Given this patient's clinical picture of 2 months hx of cold symptoms and productive sputum, and chest xray findings of probably RLL PNA. The patient is saturating >98% on oxygen on RA.   Plan -Admit to Tele.  -Start IV antibiotics for community acquired pneumonia: Rocephin plus Azithromax -Check 2 sets blood cultures   # HYPERTENSION - BP slightly elevated, will restart home meds  # DIABETES MELLITUS, TYPE II - HgbA1C  9.9 on admission.[name] Not well controlled.  -Will hold home metformin,  -start patient on SSI.  # HYPERLIPIDEMIA - Last LDL was 120 in 01/2011, Will recheck Lipid panel.  Plan: -Will increase patients statin from Atorvastatin 40mg  to Crestor 40mg  temporarily given acute presentation as high dose statin in ACS has been shown to improve outcomes.  -If repeat LDL is higher than 70 consider starting low dose statin (as having a target of <70 of LDL has been shown to reduce longterm CV complications from DM with RRR of ACS by 36% and of stroke RRR by 48%, as shown in CARDS trial.    # Substance abuse  current smoker and denies drug abuse. Given hx of cocaine abuse  - UDS -smoking cessation   # Patient states that she is scheduled to have her Port-A-Cath removed at [facility] on O2/14/13. Also she is supposed to have oral surgery to remove 9 teeth.  Both of the above procedures are needed to be rescheduled.   SignedDierdre Searles, NA 01/03/2012, 6:37 AM  Internal Medicine Teaching Service Attending Note Date: 01/03/2012 Patient name: Kristen Fromm  Medical record number: [id number]  Date of birth: 1956/02/10   I have seen and evaluated Mora Bellman and discussed their care with the Residency Team.  Ms. Bolte is 56yo Female with prior hx of mild atherosclerosis disease by cath in 2007  and decreased EF of 55% on echo in 2011. She presents with ACS, mild troponin elevation of 0.38 and EKG changes of ST depression in lateral leads and nonspecific ST-T wave changes in other leads, although unchanged from prior studies. She is admited for NSTEMI and started on heparin drip in addition to medications alleviate her chest pain. She has also undergone testing to assess her risk stratification for further heart disease including lipid panel,and hba1c. The patient mentions that she is not as adherent to her medications due to financial costs.  Physical Exam: Blood pressure 181/94, pulse 77, temperature 98.2 F (36.8 C), temperature source Oral, resp. rate 18, height 5\' 6"  (1.676 m), weight 266 lb 5.1 oz (120.8 kg), SpO2 99.00%. General: alert, well-developed, and cooperative, obese female in NAD HEENT: normocephalic and atraumatic. EOMI, PERRLA, no scleral icterus, pharynx pink and moist, no erythema, and no exudates. Neck: supple, full ROM, no thyromegaly, no JVD, and no carotid bruits.  Lungs: CTAB, normal respiratory effort, no accessory muscle use,B/L lung sounds diminished, no crackles, and no wheezes. Heart: normal rate, regular rhythm, no murmur, no gallop, and no rub.  Abdomen: soft, non-tender, normal bowel sounds, no distention, no guarding, no rebound tenderness, no HSM, obese contour Msk: no joint swelling, no joint warmth, and no redness over  joints.  Pulses: 1+ DP/PT pulses bilaterally Extremities: No cyanosis, clubbing, edema Neurologic: CN2-12 intact, motor 5/5 in all extremities, sensation in tact.  Lab results: Results for orders placed during the hospital encounter of 01/02/12 (from the past 24 hour(s))  HEPARIN LEVEL (UNFRACTIONATED)     Status: Abnormal   Collection Time   01/03/12  3:04 PM      Component Value Range   Heparin Unfractionated 0.26 (*) 0.30 - 0.70 (IU/mL)  GLUCOSE, CAPILLARY     Status: Abnormal   Collection Time   01/03/12  4:48 PM      Component  Value Range   Glucose-Capillary 232 (*) 70 - 99 (mg/dL)   Comment 1 Documented in Chart     Comment 2 Notify RN    GLUCOSE, CAPILLARY     Status: Abnormal   Collection Time   01/03/12  8:54 PM      Component Value Range   Glucose-Capillary 230 (*) 70 - 99 (mg/dL)   Comment 1 Documented in Chart     Comment 2 Notify RN    HEPARIN LEVEL (UNFRACTIONATED)     Status: Abnormal   Collection Time   01/03/12  9:31 PM      Component Value Range   Heparin Unfractionated 0.21 (*) 0.30 - 0.70 (IU/mL)  HEPARIN LEVEL (UNFRACTIONATED)     Status: Normal   Collection Time   01/04/12  5:00 AM      Component Value Range   Heparin Unfractionated 0.53  0.30 - 0.70 (IU/mL)  CBC     Status: Normal   Collection Time   01/04/12  5:00 AM      Component Value Range   WBC 6.2  4.0 - 10.5 (K/uL)   RBC 4.42  3.87 - 5.11 (MIL/uL)   Hemoglobin 12.8  12.0 - 15.0 (g/dL)   HCT 46.9  62.9 - 52.8 (%)   MCV 86.0  78.0 - 100.0 (fL)   MCH 29.0  26.0 - 34.0 (pg)   MCHC 33.7  30.0 - 36.0 (g/dL)   RDW 41.3  24.4 - 01.0 (%)   Platelets 153  150 - 400 (K/uL)  PROTIME-INR     Status: Normal   Collection Time   01/04/12  5:00 AM      Component Value Range   Prothrombin Time 12.9  11.6 - 15.2 (seconds)   INR 0.95  0.00 - 1.49   GLUCOSE, CAPILLARY     Status: Abnormal   Collection Time   01/04/12  6:11 AM      Component Value Range   Glucose-Capillary 240 (*) 70 - 99 (mg/dL)  MAGNESIUM     Status: Normal   Collection Time   01/04/12 11:34 AM      Component Value Range   Magnesium 1.8  1.5 - 2.5 (mg/dL)  GLUCOSE, CAPILLARY     Status: Abnormal   Collection Time   01/04/12 11:51 AM      Component Value Range   Glucose-Capillary 164 (*) 70 - 99 (mg/dL)    Imaging results:  Dg Chest 2 View  01/02/2012  *RADIOLOGY REPORT*  Clinical Data: Chest pain, abnormal EKG, shortness of breath.  CHEST - 2 VIEW  Comparison: CT chest 05/15/2011 and chest radiograph 08/01/2010.  Findings: Trachea is midline.  Heart is enlarged.   Right IJ power port tip projects over the SVC.  Linear scarring is seen in the left lung base. There may be new air space disease in the right infrahilar region.  No pleural fluid.  IMPRESSION: Question new air space disease in the right infrahilar region. Follow-up to clearing is recommended.  Original Report Authenticated By: Reyes Ivan, M.D.    Assessment and Plan: I agree with the formulated Assessment and Plan with the following changes: will also have case mgmt/social work get involved to see how to minimize out of pocket cost for the patient on ideal regimen for her hypertension, CAD, hyperlipidemia and diabetes management.  Duke Salvia Drue Second MD MPH [facility] [phone]

## 2012-01-02 NOTE — ED Notes (Signed)
Diet ordered for pt

## 2012-01-02 NOTE — Assessment & Plan Note (Signed)
Stable, Last seen by Oncology Dr. Gaylyn Rong 12/27/11

## 2012-01-02 NOTE — Progress Notes (Signed)
Subjective:     Patient ID: Debra Graham, female   DOB: [birth year], 56 y.o.   MRN: [id number]  HPI Ms. Klatt presents to clinic today for preop clearance for oral surgery. Was noted that her blood pressure is elevated at 1864/98 with pulse of 77 bpm. Demby reports that she has ran out of her medication and has not taking anything for her hypertensive medicine for some time now she also reports that she has been suffering from a cold for approximately one month productive of thick sputum she also reports having headaches since running out of her blood pressure medications, frequent bruising, and think she could benefit from a higher dose of Cymbalta. Of note she is reporting a dull right-sided chest pain which began today and has not been alleviated this is different from the on and off chest pain that she's been having for several weeks.  Review of Systems  Constitutional: Negative for fever.  HENT: Negative for facial swelling and neck stiffness.   Eyes: Positive for visual disturbance. Eye itching: .pekas.  Respiratory: Positive for shortness of breath.        Sob off and on, +PND  Cardiovascular: Positive for chest pain.       Patient reports new chest pain today constant sharp for several hours  Musculoskeletal: Positive for arthralgias.  Neurological: Negative for syncope.  Psychiatric/Behavioral: Negative for dysphoric mood.       Objective:   Physical Exam General: Vital signs reviewed and noted. obese, in no acute distress; alert, appropriate and cooperative throughout examination. Head: Normocephalic, atraumatic. Eyes: PERRL, EOMI, No signs of anemia or jaundince. Nose: Mucous membranes moist, not inflammed, nonerythematous. Throat: Oropharynx nonerythematous, no exudate appreciated.  Neck: No deformities, masses, or tenderness noted.Supple, No carotid Bruits, no JVD. Lungs: Normal respiratory effort. Clear to auscultation BL without crackles or wheezes. Heart: RRR. S1 and  S2 normal without gallop, murmur, or rubs, chest tenderness Abdomen: BS normoactive. Soft, Nondistended, non-tender. No masses or organomegaly. Extremities: No pretibial edema. Neurologic: nonfocal       Assessment:     #1 chest pain: Concerning for ischemia. EKG in the clinic with T-wave abnormalities in the inferior and lateral leads. This is changed from prior EKG in 2000. Patient with sick significant risk factors including diabetes hyperlipidemia and history of congestive heart failure. No increased edema or weight gain the patient does endorse paroxysmal nocturnal dyspnea.    Plan:     -Will send to the emergency department for further evaluation.  -I have refill of her medications and sent him to her pharmacy which will include increasing Cymbalta and Mucinex for cough.

## 2012-01-02 NOTE — Progress Notes (Signed)
ANTICOAGULATION CONSULT NOTE - Initial Consult  Pharmacy Consult for Heparin Indication: chest pain/ACS  No Known Allergies  Patient Measurements: TBW 123kg IBW 60kg  Heparin Dosing Weight: 89kg Ht 66in  Vital Signs: Temp: 98.5 F (36.9 C) (02/13 1955) Temp src: Oral (02/13 1333) BP: 177/75 mmHg (02/13 1830) Pulse Rate: 68  (02/13 1830)  Labs:  Basename 01/02/12 1915 01/02/12 1524  HGB -- 12.5  HCT -- 37.6  PLT -- 165  APTT -- --  LABPROT -- --  INR -- --  HEPARINUNFRC -- --  CREATININE -- 0.70  CKTOTAL -- --  CKMB -- --  TROPONINI 0.38* <0.30    Medical History: Past Medical History  Diagnosis Date  . Osteoarthritis   . Diabetes mellitus   . Hyperlipidemia   . History of cocaine abuse   . Hypertension   . Adenocarcinoma, colon 11/09    s/p subtotal colectomy with primary anastamosis by Dr. Johna Sheriff; Chemotherapy per Dr. Twanna Hy  . Carpal tunnel syndrome   . Peripheral neuropathy   . CHF (congestive heart failure)     Cath 12/2005>>Nonischemic cardiomyopathy with mild coronary irregularities. done  by Dr. Riley Kill  . Depression      Assessment: Pt admitted with CP increased Tp and ECG changes.  Seen in outpt clinic today.  No GI related issues increasing bleed risk.    Goal of Therapy:  Heparin level 0.3-0.7 units/ml   Plan:  Heparin bolus 4000 uts x1 now - ACS Heparin drip 1100 uts/hr  Check HL, CBC 6hr after drip started and daily.   Marcelino Scot 01/02/2012,9:27 PM

## 2012-01-02 NOTE — Assessment & Plan Note (Signed)
Poor control patient ran out of blood pressure medication of Norvasc and lisinopril. EP today 186 systolic. Her doctor, office visit note 24/7/13 her blood pressure was 198/112 with a pulse of 96.

## 2012-01-02 NOTE — ED Notes (Signed)
2033-01 Ready 

## 2012-01-03 ENCOUNTER — Other Ambulatory Visit: Payer: Self-pay

## 2012-01-03 ENCOUNTER — Encounter (HOSPITAL_COMMUNITY): Payer: Self-pay | Admitting: Physician Assistant

## 2012-01-03 DIAGNOSIS — I214 Non-ST elevation (NSTEMI) myocardial infarction: Secondary | ICD-10-CM

## 2012-01-03 DIAGNOSIS — R079 Chest pain, unspecified: Secondary | ICD-10-CM

## 2012-01-03 DIAGNOSIS — I517 Cardiomegaly: Secondary | ICD-10-CM

## 2012-01-03 DIAGNOSIS — E119 Type 2 diabetes mellitus without complications: Secondary | ICD-10-CM

## 2012-01-03 LAB — CBC
HCT: 35.2 % — ABNORMAL LOW (ref 36.0–46.0)
Hemoglobin: 11.7 g/dL — ABNORMAL LOW (ref 12.0–15.0)
MCV: 85.9 fL (ref 78.0–100.0)
RBC: 4.1 MIL/uL (ref 3.87–5.11)
WBC: 6.4 10*3/uL (ref 4.0–10.5)

## 2012-01-03 LAB — CARDIAC PANEL(CRET KIN+CKTOT+MB+TROPI)
CK, MB: 2 ng/mL (ref 0.3–4.0)
CK, MB: 2.1 ng/mL (ref 0.3–4.0)
CK, MB: 2.3 ng/mL (ref 0.3–4.0)
Relative Index: INVALID (ref 0.0–2.5)
Total CK: 43 U/L (ref 7–177)
Total CK: 49 U/L (ref 7–177)
Troponin I: <0.3 ng/mL (ref <0.30)
Troponin I: <0.3 ng/mL (ref <0.30)
Troponin I: <0.3 ng/mL (ref <0.30)

## 2012-01-03 LAB — DIFFERENTIAL
Basophils Relative: 0 % (ref 0–1)
Lymphs Abs: 2.3 10*3/uL (ref 0.7–4.0)
Monocytes Relative: 6 % (ref 3–12)
Neutro Abs: 3.4 10*3/uL (ref 1.7–7.7)
Neutrophils Relative %: 55 % (ref 43–77)

## 2012-01-03 LAB — LIPID PANEL
LDL Cholesterol: 120 mg/dL — ABNORMAL HIGH (ref 0–99)
Triglycerides: 187 mg/dL — ABNORMAL HIGH (ref <150)
VLDL: 37 mg/dL (ref 0–40)

## 2012-01-03 LAB — HEPARIN LEVEL (UNFRACTIONATED)
Heparin Unfractionated: <0.1 IU/mL — ABNORMAL LOW (ref 0.30–0.70)
Heparin Unfractionated: 0.21 IU/mL — ABNORMAL LOW (ref 0.30–0.70)

## 2012-01-03 LAB — GLUCOSE, CAPILLARY
Glucose-Capillary: 206 mg/dL — ABNORMAL HIGH (ref 70–99)
Glucose-Capillary: 241 mg/dL — ABNORMAL HIGH (ref 70–99)
Glucose-Capillary: 232 mg/dL — ABNORMAL HIGH (ref 70–99)

## 2012-01-03 LAB — RAPID URINE DRUG SCREEN, HOSP PERFORMED: Opiates: NOT DETECTED

## 2012-01-03 MED ORDER — OXYCODONE HCL 5 MG PO TABS
5.0000 mg | ORAL_TABLET | Freq: Once | ORAL | Status: AC
  Administered 2012-01-03: 5 mg via ORAL
  Filled 2012-01-03: qty 1

## 2012-01-03 MED ORDER — QUETIAPINE FUMARATE 50 MG PO TABS
50.0000 mg | ORAL_TABLET | Freq: Once | ORAL | Status: AC
  Administered 2012-01-03: 50 mg via ORAL
  Filled 2012-01-03: qty 1

## 2012-01-03 MED ORDER — QUETIAPINE FUMARATE 50 MG PO TABS
50.0000 mg | ORAL_TABLET | Freq: Every day | ORAL | Status: DC
  Administered 2012-01-03 – 2012-01-07 (×5): 50 mg via ORAL
  Filled 2012-01-03 (×8): qty 1

## 2012-01-03 MED ORDER — CARVEDILOL 6.25 MG PO TABS
6.2500 mg | ORAL_TABLET | Freq: Two times a day (BID) | ORAL | Status: DC
  Administered 2012-01-03: 6.25 mg via ORAL
  Filled 2012-01-03 (×4): qty 1

## 2012-01-03 MED ORDER — SODIUM CHLORIDE 0.9 % IJ SOLN: 3.0000 mL | INTRAMUSCULAR | Status: DC | PRN

## 2012-01-03 MED ORDER — DIAZEPAM 5 MG PO TABS
5.0000 mg | ORAL_TABLET | ORAL | Status: AC
  Administered 2012-01-04: 5 mg via ORAL
  Filled 2012-01-03: qty 1

## 2012-01-03 MED ORDER — SODIUM CHLORIDE 0.9 % IV SOLN
1.0000 mL/kg/h | INTRAVENOUS | Status: DC
  Administered 2012-01-03 – 2012-01-04 (×3): 1 mL/kg/h via INTRAVENOUS

## 2012-01-03 MED ORDER — SODIUM CHLORIDE 0.9 % IV SOLN: 250.0000 mL | INTRAVENOUS | Status: DC | PRN

## 2012-01-03 MED ORDER — HEPARIN BOLUS VIA INFUSION
2000.0000 [IU] | Freq: Once | INTRAVENOUS | Status: AC
  Administered 2012-01-03: 2000 [IU] via INTRAVENOUS
  Filled 2012-01-03: qty 2000

## 2012-01-03 MED ORDER — CARVEDILOL 6.25 MG PO TABS
6.2500 mg | ORAL_TABLET | Freq: Two times a day (BID) | ORAL | Status: DC
  Administered 2012-01-04: 6.25 mg via ORAL
  Filled 2012-01-03 (×3): qty 1

## 2012-01-03 MED ORDER — SODIUM CHLORIDE 0.9 % IJ SOLN: 3.0000 mL | Freq: Two times a day (BID) | INTRAMUSCULAR | Status: DC

## 2012-01-03 MED ORDER — OXYCODONE-ACETAMINOPHEN 5-325 MG PO TABS
1.0000 | ORAL_TABLET | ORAL | Status: DC | PRN
  Administered 2012-01-03 – 2012-01-08 (×15): 1 via ORAL
  Filled 2012-01-03 (×15): qty 1

## 2012-01-03 NOTE — Progress Notes (Addendum)
Subjective: Patient admitted overnight with chest pain, with 1 elevated troponin, and stable EKG abnormalities.  Patient notes no chest pain this morning, but still notes left posterior shoulder pain.  No dyspnea.  Objective: Vital signs in last 24 hours: Filed Vitals:   01/02/12 2240 01/03/12 0500 01/03/12 0515 01/03/12 1000  BP: 176/97  159/86 148/90  Pulse: 72  64   Temp: 98.5 F (36.9 C)  98.7 F (37.1 C)   TempSrc: Oral  Oral   Resp: 18  18   Height:      Weight:  268 lb 4.8 oz (121.7 kg)    SpO2: 99%  98%    Weight change:   Intake/Output Summary (Last 24 hours) at 01/03/12 1146 Last data filed at 01/03/12 0800  Gross per 24 hour  Intake 775.52 ml  Output      0 ml  Net 775.52 ml   Physical Exam: General: alert, cooperative, and in no apparent distress HEENT: pupils equal round and reactive to light, vision grossly intact, oropharynx clear and non-erythematous  Neck: supple, no lymphadenopathy Lungs: clear to ascultation bilaterally, normal work of respiration, no wheezes, rales, ronchi Heart: regular rate and rhythm, no murmurs, gallops, or rubs Abdomen: soft, non-tender, non-distended, normal bowel sounds Extremities:  LE edema bilaterally Neurologic: alert & oriented X3, cranial nerves II-XII intact, strength grossly intact, sensation intact to light touch  Lab Results: Basic Metabolic Panel:  Lab 01/02/12 8657  NA 141  K 3.7  CL 106  CO2 26  GLUCOSE 200*  BUN 15  CREATININE 0.70  CALCIUM 9.2  MG --  PHOS --   Liver Function Tests:  Lab 01/02/12 1524  AST 15  ALT 12  ALKPHOS 74  BILITOT 0.3  PROT 6.6  ALBUMIN 2.9*   CBC:  Lab 01/03/12 0445 01/02/12 1524  WBC 6.4 8.0  NEUTROABS 3.4 5.3  HGB 11.7* 12.5  HCT 35.2* 37.6  MCV 85.9 85.8  PLT 136* 165   Cardiac Enzymes:  Lab 01/03/12 0445 01/02/12 2315 01/02/12 1915  CKTOTAL 43 51 --  CKMB 2.1 2.0 --  CKMBINDEX -- -- --  TROPONINI <0.30 <0.30 0.38*   CBG:  Lab 01/03/12 1141 01/03/12  0545 01/02/12 1353  GLUCAP 241* 206* 223*   Hemoglobin A1C:  Lab 01/02/12 1412  HGBA1C 9.9   Fasting Lipid Panel:  Lab 01/03/12 0445  CHOL 216*  HDL 59  LDLCALC 120*  TRIG 187*  CHOLHDL 3.7  LDLDIRECT --   Thyroid Function Tests:  Lab 01/02/12 2104  TSH 0.334*  T4TOTAL --  FREET4 --  T3FREE --  THYROIDAB --    Medications: I have reviewed the patient's current medications. Scheduled Meds:   . amLODipine  10 mg Oral Daily  . aspirin  324 mg Oral Once  . aspirin EC  81 mg Oral Daily  . azithromycin (ZITHROMAX) 500 MG IVPB  500 mg Intravenous Once  . carvedilol  6.25 mg Oral BID WC  . cefTRIAXone (ROCEPHIN)  IV  1 g Intravenous Once  . DULoxetine  60 mg Oral Daily  . furosemide  20 mg Oral Daily  . guaiFENesin  600 mg Oral BID  . heparin  2,000 Units Intravenous Once  . heparin  4,000 Units Intravenous Once  . insulin aspart  0-9 Units Subcutaneous TID WC  . lisinopril  40 mg Oral Daily  . QUEtiapine  50 mg Oral QHS  . QUEtiapine  50 mg Oral Once  . rosuvastatin  40 mg  Oral Daily  . sodium chloride  3 mL Intravenous Q12H  . DISCONTD: azithromycin (ZITHROMAX) 500 MG IVPB  500 mg Intravenous Q24H  . DISCONTD: carvedilol  6.25 mg Oral BID WC  . DISCONTD: cefTRIAXone (ROCEPHIN)  IV  1 g Intravenous Q24H  . DISCONTD: QUEtiapine  50 mg Oral QHS   Continuous Infusions:   . heparin 1,500 Units/hr (01/03/12 0706)   PRN Meds:.sodium chloride, acetaminophen, morphine injection, nitroGLYCERIN, ondansetron (ZOFRAN) IV, sodium chloride  Assessment/Plan: The patient is a 56 yo woman, history of CHF, DM, HTN, HLD, prior colon cancer s/p subtotal colectomy, presenting with chest pain and elevated troponin, concerning for NSTEMI.  # Chest Pain - Patient presents with chest pain, with a history of 30% stenosis of ramus intermedius and circumflex on cath 01/04/2006, HTN, DM, HL, smoking, and ?FH.  Patient found to have elevated troponin x1, and old ST changes on EKG.  Patient  started on heparin drip. -cardiology consult, consider inpatient stress test -continue heparin drip -repeat Echo -CE's have peaked and are now negative -continue aspirin, statin, nitro, morphine, and coreg -monitor via telemetry  # Infection unlikely - Patient initially given diagnosis of pnuemonia, but given no SIRS criteria and no significant opacity on chest x-ray, infection is unlikely.   -blood cultures already sent, will follow-up -d/c ceftriaxone, azithromax  # Hypertension - chronic, stable -continue amlodipine, coreg, lasix, lisinopril  # Diabetes - Hb A1C = 9.9, managed on home metformin 500 BID -SSI while inpatient, will likely need lantus as outpatient  # Hyperlipidemia - LDL = 120, patient possibly non-compliant as outpatient -switched from atorvastatin 40 to crestor 40  # History of tobacco abuse - considering quitting -appreciate tobacco cessation counselor  # Port-a-Cath in place - implanted for chemo, plan to remove at [facility] next week -will touch base with oncology, can consider removing via IR during this admission, though likely will not remove while on Heparin drip.  Will defer until further work-up of chest pain.  # Prophy - heparin drip   LOS: 1 day   [name] 01/03/2012, 11:46 AM  Internal Medicine Teaching Service Attending Note Date: 01/03/2012  Patient name: Debra Graham  Medical record number: [id number]  Date of birth: 1956/04/06    This patient has been seen and discussed with the house staff on 01/03/2012. Please see their note for complete details. I concur with their findings with the following additions/corrections: will continue to anticoagulate with heparin for NSTEMI and will have cardiology consultation to evaluate for further studies/procedure to benefit her be it stress test vs. Heart catherization. In the meantime, will maximize hypertension, hyperlipidemia, and diabetes management. The patient has listed financial problems as one reason  for not having better compliance with her medication. We will discuss with SW/CM options to reduce the patient's OOP cost.  Aram Beecham B. Drue Second MD MPH [facility] [phone]

## 2012-01-03 NOTE — Progress Notes (Signed)
Inpatient Diabetes Program Recommendations  AACE/ADA: New Consensus Statement on Inpatient Glycemic Control (2009)  Target Ranges:  Prepandial:   less than 140 mg/dL      Peak postprandial:   less than 180 mg/dL (1-2 hours)      Critically ill patients:  140 - 180 mg/dL   Reason for Visit:  Results for Debra Graham, Debra Graham (MRN [id number]) as of 01/03/2012 13:49  Ref. Range 02/05/2011 08:40 07/11/2011 13:41 01/02/2012 13:53 01/03/2012 05:45 01/03/2012 11:41  Glucose-Capillary Latest Range: 70-99 mg/dL 846 (H) 962 (H) 952 (H) 206 (H) 241 (H)     Inpatient Diabetes Program Recommendations Insulin - Basal: Consider adding Lantus 15 units daily if CBGs continue to be greater than 180 mg/dl.  Note:

## 2012-01-03 NOTE — ED Provider Notes (Signed)
Medical screening examination/treatment/procedure(s) were conducted as a shared visit with non-physician practitioner(s) and myself.  I personally evaluated the patient during the encounter  Matias Thurman, MD 01/03/12 0051 

## 2012-01-03 NOTE — Consult Note (Signed)
Pt smokes 1/4 ppd and is very interested in quitting. She is in action stage and says after being told about her heart condition and how it's not getting any oxygen she is done with cigarettes. Encouraged pt to quit. Referred to 1-800 quit now for f/u and support. Discussed oral fixation substitutes, second hand smoke and in home smoking policy. Reviewed and gave pt Written education/contact information.

## 2012-01-03 NOTE — Consult Note (Signed)
Cardiology Consult Note   Patient ID: Debra Graham MRN: [id number], DOB/AGE: 56/05/1956   Admit date: 01/02/2012 Date of Consult: 01/03/2012  Primary Physician: Kristie Cowman, MD, MD Primary Cardiologist: New to cardiology  Pt. Profile: Debra Graham is a 56yo   Female with PMHx significant for chronic diastolic CHF (per most recent echo 09/11: moderate LVH, LVEF 55% to 60%), CAD (cardiac cath 02/07 outlined below), NICM (liked secondary to longstanding, uncontrolled HTN), history of cocaine abuse (2007, chest pain, troponins borderline, necessitating cath), obesity, uncontrolled type 2 DM (recent HgbA1C 9.9, noncompliant with Metformin, with peripheral neuropathy), uncontrolled HL (recent LDL 120) and uncontrolled HTN (with SBP 190s at times, ran out of BP meds) all noted on recent IM outpatient office note who was admitted to [facility] with chest pain.   Past Cardiac History  Cardiac Catheterization 12/2005   HEMODYNAMIC DATA.:  1.  Central aortic pressure 157/98, mean 122.  2.  Left ventricular pressure 151/42.  3.  No gradient pullback across the aortic valve.  4.  Right atrial 6.  5.  RV 45-6.  6.  Pulmonary artery 45/24.  7.  Pulmonary capillary wedge 23.  8.  Superior vena cava saturation 59%.  9.  PA saturation 62%.  10. Aortic saturation 92%.  11. Fick cardiac output 3.5 L/min.  12. Fick cardiac index 1.7 L/min. per sq. m.  13. Thermodilution cardiac output 6.1 L/min.  14. Thermodilution cardiac index 2.9 L/min. per sq. m.   ANGIOGRAPHIC DATA.:  1.  Ventriculography was performed in the RAO projection.  Ejection      fractions cannot be calculated in lab 5.  Estimated ejection fraction      would be in the range of 20-25%.  There may be mild mitral      regurgitation.  2.  The left main is free of critical disease.  3.  The LAD courses to the apex.  It provides a major diagonal branch.  The      LAD and diagonal are free of critical disease and without significant  focal narrowing.  4.  The ramus intermedius has about 30% proximal irregularity and is a large-      caliber vessel but free of critical disease.  5.  The circumflex is a dominant vessel.  It provides a marginal system.      There is a posterior descending branch, which comes off and is modest in      size with about 30% segmental plaquing.  6.  The right coronary is a nondominant vessel.  7.  Hand injection did not suggest aortic regurgitation.   CONCLUSIONS:  Nonischemic cardiomyopathy with mild coronary irregularities.   RECOMMENDATIONS:  1.  Per Drs. Myrtis Ser and [place].  2.  The patient may need an EP evaluation.  3.  Hypertension control will be important.  4.  Diabetes mellitus management will also be important.  5.  Aggressive risk factor reduction.  6.  The patient may need to have evaluation for sleep apnea.  7.  She will need to be followed in the CHF clinic, likely with long-term      follow-up with Dr. Myrtis Ser.  2D echocardiogram 2007    SUMMARY   -  The left ventricle was moderately to markedly dilated. Overall         left ventricular systolic function was markedly decreased.         Left ventricular ejection fraction was estimated , range  being 20 % to 25 %. There was severe diffuse left ventricular         hypokinesis. Doppler parameters were consistent with abnormal         left ventricular relaxation.   -  Aortic valve thickness was mildly increased.   -  The left atrium was mild to moderately dilated.   -  Consider evalution for potential causes of dilated cardiomyopathy         and invasive cardiac evaluation if indicated.   IMPRESSIONS   -  Consider evalution for potential causes of dilated cardiomyopathy         and invasive cardiac evaluation if indicated.   -  These features are consistent with dilated cardiomyopathy.  2D echocardiogram 2009   SUMMARY   -  The left ventricle was mildly dilated. Overall left ventricular         systolic  function was severely reduced. Left ventricular         ejection fraction was estimated , range being 20 % to 25 %.         There was severe diffuse left ventricular hypokinesis. Left         ventricular wall thickness was mildly increased. Doppler         parameters were consistent with high left ventricular filling         pressure.   -  There was mild to moderate mitral annular calcification. There         was mild to moderate mitral valvular regurgitation.   -  The left atrium was mildly dilated. The interatrial septum bows         from left to right, consistent with increased left atrial         pressure.   -  The estimated peak pulmonary artery systolic pressure was         moderately increased.  Reason for consult: evaluation of chest pain  Problem List: Past Medical History  Diagnosis Date  . Osteoarthritis   . Diabetes mellitus   . Hyperlipidemia   . History of cocaine abuse   . Hypertension   . Adenocarcinoma, colon 11/09    s/p subtotal colectomy with primary anastamosis by Dr. Johna Sheriff; Chemotherapy per Dr. Twanna Hy   . patient also endorses chronic intermittent abdominal aching and loose stools a/c with her colon cancer and Sx.   . Carpal tunnel syndrome   . Peripheral neuropathy   . CHF (congestive heart failure)     Cath 12/2005>>Nonischemic cardiomyopathy with mild coronary irregularities. done  by Dr. Riley Kill, repeated 2D echo in 2011  showed EF 55-60%.  . Depression     Past Surgical History  Procedure Date  . Subtotal colectomy with ileosigmoidostomy 10/09     Allergies: No Known Allergies  HPI:   She presented to her PCP for preop clearance for oral surgery yesterday. Her BP was found to be elevated at that time. The patient reports not taking her usual antihypertensives as she had recently moved and run out. She also endorsed R sided chest pain occurring for at least over a year described as sharp, lasting only seconds, intermittent and relieved with NTG.  She states she can have several episodes at a time prior to taking NTG SL. The pain radiates to both of her arms and is rated at a 8/10. She reports occasional associated shortness of breath, lightheadedness, diaphoresis, palpitations, 2 pillow orthopnea, DOE, PND. She denies n/v and increased LE swelling.  She denies weighing herself. She denies recent elicit drug use. She reports subjective fever and chills, cough productive yellow/green sputum at home for the past 1 month and states she has had a cold during this time.   She experienced the pain at her PCP's office describing it as bearable, and different from prior episodes of chest pain. An EKG was drawn revealing anterolateral ischemic changes which differed from a EKG performed in 2000, and she was transferred to [facility] ED. EKG in the ED revealed NSR, anterolateral TWIs and anterior/inferior Q waves, LAD, LAE. Second TnI drawn was mildly elevated at 0.38, otherwise CEs negative. CXR revealed questionable new R infrahilar airspace disease. UDS was negative.   She was subsequently admitted by internal medicine. She currently rates her pain at a 4/10 and is no acute distress.   Inpatient Medications:     . amLODipine  10 mg Oral Daily  . aspirin  324 mg Oral Once  . aspirin EC  81 mg Oral Daily  . azithromycin (ZITHROMAX) 500 MG IVPB  500 mg Intravenous Once  . carvedilol  6.25 mg Oral BID WC  . cefTRIAXone (ROCEPHIN)  IV  1 g Intravenous Once  . DULoxetine  60 mg Oral Daily  . furosemide  20 mg Oral Daily  . guaiFENesin  600 mg Oral BID  . heparin  2,000 Units Intravenous Once  . heparin  4,000 Units Intravenous Once  . insulin aspart  0-9 Units Subcutaneous TID WC  . lisinopril  40 mg Oral Daily  . QUEtiapine  50 mg Oral QHS  . QUEtiapine  50 mg Oral Once  . rosuvastatin  40 mg Oral Daily  . sodium chloride  3 mL Intravenous Q12H  . DISCONTD: azithromycin (ZITHROMAX) 500 MG IVPB  500 mg Intravenous Q24H  . DISCONTD: carvedilol  6.25 mg  Oral BID WC  . DISCONTD: cefTRIAXone (ROCEPHIN)  IV  1 g Intravenous Q24H  . DISCONTD: QUEtiapine  50 mg Oral QHS   Prescriptions prior to admission  Medication Sig Dispense Refill  . amLODipine (NORVASC) 10 MG tablet Take 1 tablet (10 mg total) by mouth daily.  30 tablet  6  . atorvastatin (LIPITOR) 40 MG tablet Take 1 tablet (40 mg total) by mouth daily.  30 tablet  11  . carvedilol (COREG) 6.25 MG tablet Take 1 tablet (6.25 mg total) by mouth 2 (two) times daily with a meal.  60 tablet  5  . furosemide (LASIX) 20 MG tablet Take 1 tablet (20 mg total) by mouth daily.  30 tablet  3  . lisinopril (PRINIVIL,ZESTRIL) 40 MG tablet Take 1 tablet (40 mg total) by mouth daily.  30 tablet  5  . metFORMIN (GLUCOPHAGE) 500 MG tablet Take 1 tablet (500 mg total) by mouth 2 (two) times daily with a meal.  60 tablet  11  . nitroGLYCERIN (NITROSTAT) 0.4 MG SL tablet Place 1 tablet (0.4 mg total) under the tongue every 5 (five) minutes. For chest pain. If more than 2 doses needed, call 911.  15 tablet  3  . QUEtiapine (SEROQUEL) 50 MG tablet Take 50 mg by mouth at bedtime.      . traMADol (ULTRAM) 50 MG tablet Take 1 tablet (50 mg total) by mouth every 6 (six) hours as needed. For pain  90 tablet  1  . traZODone (DESYREL) 50 MG tablet Take 50 mg by mouth at bedtime as needed. As needed for sleep.      . DULoxetine (CYMBALTA)  30 MG capsule Take 2 capsules (60 mg total) by mouth daily.  30 capsule  2  . guaiFENesin (MUCINEX) 600 MG 12 hr tablet Take 1 tablet (600 mg total) by mouth 2 (two) times daily.  60 tablet  0    Family History  Problem Relation Age of Onset  . Breast cancer Father   . Hypertension Mother   . Heart disease Mother   . Heart attack Brother     All siblings passed away  . Cancer      Mother's side of family     History   Social History  . Marital Status: Legally Separated    Spouse Name: N/A    Number of Children: 3  . Years of Education: N/A   Occupational History  .  Disabled     Social History Main Topics  . Smoking status: Current Everyday Smoker -- 0.4 packs/day    Types: Cigarettes  . Smokeless tobacco: Not on file   Comment: Not ready as of yet.  Is decreasing amounts.  . Alcohol Use: No  . Drug Use: No  . Sexually Active: Not on file   Other Topics Concern  . Not on file   Social History Narrative   Lives in [place], [place] with son and family.      Review of Systems: General: positive for chills, fever, negative for night sweats or weight changes.  Cardiovascular: positive for chest pain, lightheadedness, dyspnea on exertion, orthopnea, palpitations, paroxysmal nocturnal dyspnea and shortness of breath, negative for edema Dermatological: negative for rash Respiratory: positive for cough, negative for wheezing Urologic: negative for hematuria Abdominal: negative for nausea, vomiting, diarrhea, bright red blood per rectum, melena, or hematemesis Neurologic: negative for visual changes, syncope, or dizziness All other systems reviewed and are otherwise negative except as noted above.  Physical Exam: Blood pressure 148/90, pulse 64, temperature 97.2 F (36.2 C), temperature source Oral, resp. rate 14, height 5\' 6"  (1.676 m), weight 121.7 kg (268 lb 4.8 oz), SpO2 97.00%.    General: Obese, in no acute distress. Head: Normocephalic, atraumatic, sclera non-icteric, no xanthomas  Neck: Negative for carotid bruits. JVD not elevated. Lungs: Clear bilaterally to auscultation without wheezes, rales, or rhonchi. Breathing is unlabored. Heart: RRR with S1 S2. No murmurs, rubs, or gallops appreciated. Abdomen: Soft, non-tender, non-distended with normoactive bowel sounds. No hepatomegaly. No rebound/guarding. No obvious abdominal masses. Msk:  Strength and tone appears normal for age. Extremities: Trace pedal edema noted. No clubbing, cyanosis.  Distal pedal pulses are 2+ and equal bilaterally. Neuro: Alert and oriented X 3. Moves all extremities  spontaneously. Psych:  Responds to questions appropriately with a normal affect.  Labs: Recent Labs  Basename 01/03/12 0445 01/02/12 1524   WBC 6.4 8.0   HGB 11.7* 12.5   HCT 35.2* 37.6   MCV 85.9 85.8   PLT 136* 165    Lab 01/02/12 1524  NA 141  K 3.7  CL 106  CO2 26  BUN 15  CREATININE 0.70  CALCIUM 9.2  PROT 6.6  BILITOT 0.3  ALKPHOS 74  ALT 12  AST 15  AMYLASE --  LIPASE --  GLUCOSE 200*   Recent Labs  [facility] 01/02/12 1412   HGBA1C 9.9   Recent Labs  Basename 01/03/12 0445 01/02/12 2315 01/02/12 1915   CKTOTAL 43 51 --   CKMB 2.1 2.0 --   CKMBINDEX -- -- --   TROPONINI <0.30 <0.30 0.38*   Recent Labs  [facility] 01/03/12 0445  CHOL 216*   HDL 59   LDLCALC 120*   TRIG 187*   CHOLHDL 3.7   LDLDIRECT --    Basename 01/02/12 2104  TSH 0.334*  T4TOTAL --  T3FREE --  THYROIDAB --    Radiology/Studies: Dg Chest 2 View  01/02/2012  *RADIOLOGY REPORT*  Clinical Data: Chest pain, abnormal EKG, shortness of breath.  CHEST - 2 VIEW  Comparison: CT chest 05/15/2011 and chest radiograph 08/01/2010.  Findings: Trachea is midline.  Heart is enlarged.  Right IJ power port tip projects over the SVC.  Linear scarring is seen in the left lung base. There may be new air space disease in the right infrahilar region.  No pleural fluid.  IMPRESSION: Question new air space disease in the right infrahilar region. Follow-up to clearing is recommended.  Original Report Authenticated By: Reyes Ivan, M.D.   EKG: 01/13 1417: NSR, 74 bpm, LAD, LAE, TWIs V4-V6, I, aVL, V1-V2 Q waves vs poor R wave progression unchanged from 2011 tracing, new Q waves III, aVF   01/13 1805: NSR, 70 bpm, otherwise similar findings  ASSESSMENT AND PLAN:   1. Chest pain- atypical for cardiac source; patient does have type 2 DM and other significant risk factors in obesity, HTN and HL which are uncontrolled. Pt endorses these chest pain episodes for over a year now, progressively worsening with  associated SOB, DOE and lightheadedness. She has one positive TnI, however, subsequent sets of cardiac biomarkers are WNL. She has had cath in the past in the setting of cocaine use, revealing diffuse nonobstructive CAD, LVEF 20-25% at that time. More recent echo in 2011 with LVEF 55-60%. EKG this admission with nonspecific changes, and resembling prior EKG in 2011. Given her significant cardiac risk factors, evidence of CAD in the past and HPI concerning for unstable angina, will plan for cath tomorrow.  - Will put in pre-cath orders  - NPO after midnight  - Continue Hep gtt  - Continue daily ASA, BB, NTG SL PRN   2. HTN- longstanding and uncontrolled; remains elevated this admission  - Continue outpatient antihypertensives reinitiated by primary team  - Will monitor   3. Nonischemic cardiomyopathy- likely 2/2 longstanding, uncontrolled HTN vs cocaine-use. LVEF 20-25% on 2009 echo, 55-60% on 201 echo.   - 2D echo ordered by primary team and is pending  - Will review  4. Hyperlipidemia- LDL 120, home statin replaced with Crestor 40mg   - Continue statin  5. Type 2 DM- uncontrolled  - Per primary team    Signed, R. Hurman Horn, PA-C 01/03/2012, 1:29 PM   Patient seen with PA, agree with note.  Obese patient with poorly controlled DM, HTN and ongoing smoking presented with hypertensive urgency and chest pain.  She has had on and off chest pain for about a year but it has been worse for 2-3 weeks.  She has used NTG x 3 during that period.  Pain is not clearly exertional.  She had prolonged CP at rest yesterday.  ECG with nonspecific abnormalities but similar to prior ECGs.  She also has a history of prior nonischemic cardiomyopathy possibly due to cocaine that recovered to normal when echo was last done.  1. Chest pain: Multiple CAD risk factors.  1 troponin positive, others normal.  Prolonged chest pain.  It is certainly possible that the abnormal troponin was spurious as all others were  normal.  However, patient is at baseline high risk for CAD and has an unfavorable body habitus for  myoview.  I talked to the patient about left heart cath risks/benefits and she agrees to proceed.  - Left heart cath tomorrow, would use radial access.  2. HTN: Hypertensive urgency, had run out of her antihypertensives.  BP still high but meds just restarted.  3. Cardiomyopathy: Patient had history of nonischemic CMP in the setting of cocaine use and poorly-controlled hypertension.  Last echo showed normal EF.  Will repeat echo to follow LV function.   [name] 01/03/2012 2:35 PM

## 2012-01-03 NOTE — Progress Notes (Signed)
01/03/12  HgbA1C is 9.9% on 01/02/12.  Will need reevaluation of diabetes medications at discharge.  Smith Mince RN BSN

## 2012-01-03 NOTE — Progress Notes (Signed)
ANTICOAGULATION CONSULT NOTE - Follow Up Consult  Pharmacy Consult for heparin Indication: chest pain/ACS  Labs:  Basename 01/03/12 2131 01/03/12 1504 01/03/12 1241 01/03/12 0445 01/02/12 2315 01/02/12 1524  HGB -- -- -- 11.7* -- 12.5  HCT -- -- -- 35.2* -- 37.6  PLT -- -- -- 136* -- 165  APTT -- -- -- -- -- --  LABPROT -- -- -- -- -- --  INR -- -- -- -- -- --  HEPARINUNFRC 0.21* 0.26* -- <0.10* -- --  CREATININE -- -- -- -- -- 0.70  CKTOTAL -- -- 49 43 51 --  CKMB -- -- 2.3 2.1 2.0 --  TROPONINI -- -- <0.30 <0.30 <0.30 --   Assessment: 56yo female now with lower heparin level after rate increase (no line issues per RN).  Goal of Therapy:  Heparin level 0.3-0.7 units/ml   Plan:  Will increase gtt to 2100 units/hr and check level with am labs.  Colleen Can PharmD BCPS 01/03/2012,11:25 PM

## 2012-01-03 NOTE — Progress Notes (Signed)
  Echocardiogram 2D Echocardiogram has been performed.  Debra Graham 01/03/2012, 4:50 PM

## 2012-01-03 NOTE — Progress Notes (Signed)
ANTICOAGULATION CONSULT NOTE - Follow Up Consult  Pharmacy Consult for heparin Indication: chest pain/ACS  No Known Allergies  Patient Measurements: Height: 5\' 6"  (167.6 cm) Weight: 268 lb 4.8 oz (121.7 kg) IBW/kg (Calculated) : 59.3  Heparin Dosing Weight: 89kg  Vital Signs: Temp: 97.2 F (36.2 C) (02/14 1410) Temp src: Oral (02/14 1410) BP: 156/81 mmHg (02/14 1410) Pulse Rate: 70  (02/14 1410)  Labs:  Basename 01/03/12 1504 01/03/12 1241 01/03/12 0445 01/02/12 2315 01/02/12 1524  HGB -- -- 11.7* -- 12.5  HCT -- -- 35.2* -- 37.6  PLT -- -- 136* -- 165  APTT -- -- -- -- --  LABPROT -- -- -- -- --  INR -- -- -- -- --  HEPARINUNFRC 0.26* -- <0.10* -- --  CREATININE -- -- -- -- 0.70  CKTOTAL -- 49 43 51 --  CKMB -- 2.3 2.1 2.0 --  TROPONINI -- <0.30 <0.30 <0.30 --   Estimated Creatinine Clearance: 105.7 ml/min (by C-G formula based on Cr of 0.7).   Medications:  Infusions:    . sodium chloride    . heparin 1,500 Units/hr (01/03/12 1156)    Assessment: 55 yof on IV heparin for chest pain/ACS. Unable to perform myoview so planning for cath tomorrow. Heparin level is slightly subtherapeutic at 0.26. No bleeding noted. Pt is anemic and thrombocytopenic though.   Goal of Therapy:  Heparin level 0.3-0.7 units/ml   Plan:  1. Increase heparin to 1700units/hr 2. Check 6 hour heparin level  Leonela Kivi, Drake Leach 01/03/2012,3:44 PM

## 2012-01-03 NOTE — Progress Notes (Signed)
ANTICOAGULATION CONSULT NOTE   Pharmacy Consult for Heparin Indication: chest pain/ACS  No Known Allergies  Patient Measurements: TBW 123kg IBW 60kg Height: 5\' 6"  (167.6 cm) Weight: 268 lb 4.8 oz (121.7 kg) IBW/kg (Calculated) : 59.3  Vital Signs: Temp: 98.7 F (37.1 C) (02/14 0515) Temp src: Oral (02/14 0515) BP: 159/86 mmHg (02/14 0515) Pulse Rate: 64  (02/14 0515)  Labs:  Basename 01/03/12 0445 01/02/12 2315 01/02/12 1915 01/02/12 1524  HGB 11.7* -- -- 12.5  HCT 35.2* -- -- 37.6  PLT 136* -- -- 165  APTT -- -- -- --  LABPROT -- -- -- --  INR -- -- -- --  HEPARINUNFRC <0.10* -- -- --  CREATININE -- -- -- 0.70  CKTOTAL PENDING 51 -- --  CKMB 2.1 2.0 -- --  TROPONINI <0.30 <0.30 0.38* --    Assessment: 55 yo female with chest pain for Heparin  Goal of Therapy:  Heparin level 0.3-0.7 units/ml   Plan:  Heparin bolus 2000 units IV bolus, then increase Heparin 1500 units/hr Check heparin level in 6 hours.    Eddie Candle 01/03/2012,6:59 AM

## 2012-01-04 ENCOUNTER — Encounter (HOSPITAL_COMMUNITY)
Admission: EM | Disposition: A | Discharge status: Home / Self Care | Payer: Self-pay | Source: Ambulatory Visit | Attending: Internal Medicine

## 2012-01-04 ENCOUNTER — Other Ambulatory Visit: Payer: Self-pay

## 2012-01-04 DIAGNOSIS — R079 Chest pain, unspecified: Secondary | ICD-10-CM

## 2012-01-04 DIAGNOSIS — E785 Hyperlipidemia, unspecified: Secondary | ICD-10-CM

## 2012-01-04 HISTORY — PX: LEFT HEART CATHETERIZATION WITH CORONARY ANGIOGRAM: SHX5451

## 2012-01-04 LAB — CBC
HCT: 38 % (ref 36.0–46.0)
MCH: 29.1 pg (ref 26.0–34.0)
MCHC: 33.7 g/dL (ref 30.0–36.0)
Platelets: 153 10*3/uL (ref 150–400)
Platelets: 150 10*3/uL (ref 150–400)
RBC: 4.13 MIL/uL (ref 3.87–5.11)
RDW: 13.3 % (ref 11.5–15.5)
RDW: 13.3 % (ref 11.5–15.5)
WBC: 6.2 10*3/uL (ref 4.0–10.5)

## 2012-01-04 LAB — MRSA PCR SCREENING: MRSA by PCR: NEGATIVE

## 2012-01-04 LAB — PROTIME-INR
INR: 0.95 (ref 0.00–1.49)
Prothrombin Time: 12.9 seconds (ref 11.6–15.2)

## 2012-01-04 LAB — GLUCOSE, CAPILLARY
Glucose-Capillary: 240 mg/dL — ABNORMAL HIGH (ref 70–99)
Glucose-Capillary: 150 mg/dL — ABNORMAL HIGH (ref 70–99)

## 2012-01-04 LAB — CREATININE, SERUM: Creatinine, Ser: 0.62 mg/dL (ref 0.50–1.10)

## 2012-01-04 LAB — HEPARIN LEVEL (UNFRACTIONATED): Heparin Unfractionated: 0.53 IU/mL (ref 0.30–0.70)

## 2012-01-04 SURGERY — LEFT HEART CATHETERIZATION WITH CORONARY ANGIOGRAM
Anesthesia: LOCAL

## 2012-01-04 MED ORDER — CARVEDILOL 12.5 MG PO TABS
12.5000 mg | ORAL_TABLET | Freq: Two times a day (BID) | ORAL | Status: DC
  Administered 2012-01-04 – 2012-01-08 (×8): 12.5 mg via ORAL
  Filled 2012-01-04 (×10): qty 1

## 2012-01-04 MED ORDER — FENTANYL CITRATE 0.05 MG/ML IJ SOLN
INTRAMUSCULAR | Status: AC
  Filled 2012-01-04: qty 2

## 2012-01-04 MED ORDER — MAGNESIUM SULFATE 40 MG/ML IJ SOLN
2.0000 g | Freq: Once | INTRAMUSCULAR | Status: DC
  Administered 2012-01-04: 2 g via INTRAVENOUS
  Filled 2012-01-04: qty 50

## 2012-01-04 MED ORDER — HEPARIN SODIUM (PORCINE) 1000 UNIT/ML IJ SOLN
INTRAMUSCULAR | Status: AC
  Filled 2012-01-04: qty 1

## 2012-01-04 MED ORDER — SODIUM CHLORIDE 0.9 % IJ SOLN: 3.0000 mL | INTRAMUSCULAR | Status: DC | PRN

## 2012-01-04 MED ORDER — SODIUM CHLORIDE 0.9 % IJ SOLN
3.0000 mL | Freq: Two times a day (BID) | INTRAMUSCULAR | Status: DC
  Administered 2012-01-04 – 2012-01-08 (×6): 3 mL via INTRAVENOUS

## 2012-01-04 MED ORDER — ACETAMINOPHEN 325 MG PO TABS: 650.0000 mg | ORAL_TABLET | ORAL | Status: DC | PRN

## 2012-01-04 MED ORDER — FENTANYL CITRATE 0.05 MG/ML IJ SOLN
25.0000 ug | Freq: Once | INTRAMUSCULAR | Status: AC
  Administered 2012-01-04: 25 ug via INTRAVENOUS

## 2012-01-04 MED ORDER — FUROSEMIDE 10 MG/ML IJ SOLN
INTRAMUSCULAR | Status: AC
  Administered 2012-01-05: 40 mg via INTRAVENOUS
  Filled 2012-01-04: qty 4

## 2012-01-04 MED ORDER — MIDAZOLAM HCL 2 MG/2ML IJ SOLN
INTRAMUSCULAR | Status: AC
  Filled 2012-01-04: qty 2

## 2012-01-04 MED ORDER — HEPARIN SODIUM (PORCINE) 5000 UNIT/ML IJ SOLN
5000.0000 [IU] | Freq: Three times a day (TID) | INTRAMUSCULAR | Status: AC
  Administered 2012-01-04 – 2012-01-06 (×7): 5000 [IU] via SUBCUTANEOUS
  Filled 2012-01-04 (×8): qty 1

## 2012-01-04 MED ORDER — FUROSEMIDE 10 MG/ML IJ SOLN
40.0000 mg | Freq: Two times a day (BID) | INTRAMUSCULAR | Status: DC
  Administered 2012-01-04 – 2012-01-05 (×2): 40 mg via INTRAVENOUS
  Filled 2012-01-04 (×4): qty 4

## 2012-01-04 MED ORDER — VERAPAMIL HCL 2.5 MG/ML IV SOLN
INTRAVENOUS | Status: AC
  Filled 2012-01-04: qty 2

## 2012-01-04 MED ORDER — HYDROCHLOROTHIAZIDE 25 MG PO TABS
25.0000 mg | ORAL_TABLET | Freq: Every day | ORAL | Status: DC
  Administered 2012-01-04: 25 mg via ORAL
  Filled 2012-01-04 (×2): qty 1

## 2012-01-04 MED ORDER — ONDANSETRON HCL 4 MG/2ML IJ SOLN
4.0000 mg | Freq: Four times a day (QID) | INTRAMUSCULAR | Status: DC | PRN
  Administered 2012-01-04 – 2012-01-07 (×3): 4 mg via INTRAVENOUS
  Filled 2012-01-04 (×3): qty 2

## 2012-01-04 MED ORDER — NITROGLYCERIN IN D5W 200-5 MCG/ML-% IV SOLN
2.0000 ug/min | INTRAVENOUS | Status: DC
  Filled 2012-01-04: qty 250

## 2012-01-04 MED ORDER — INSULIN GLARGINE 100 UNIT/ML ~~LOC~~ SOLN
10.0000 [IU] | Freq: Every day | SUBCUTANEOUS | Status: DC
  Administered 2012-01-04 – 2012-01-05 (×2): 10 [IU] via SUBCUTANEOUS
  Filled 2012-01-04: qty 3

## 2012-01-04 MED ORDER — NITROGLYCERIN IN D5W 200-5 MCG/ML-% IV SOLN
INTRAVENOUS | Status: AC
  Filled 2012-01-04: qty 250

## 2012-01-04 MED ORDER — SODIUM CHLORIDE 0.9 % IV SOLN
250.0000 mL | INTRAVENOUS | Status: DC
  Administered 2012-01-04: 23:00:00 via INTRAVENOUS

## 2012-01-04 MED ORDER — SODIUM CHLORIDE 0.9 % IV SOLN: INTRAVENOUS | Status: DC

## 2012-01-04 NOTE — Progress Notes (Signed)
ANTICOAGULATION CONSULT NOTE - Follow Up Consult  Pharmacy Consult for heparin Indication: chest pain/ACS  Vital Signs: Temp: 97.9 F (36.6 C) (02/15 0405) Temp src: Oral (02/15 0405) BP: 180/85 mmHg (02/15 1017) Pulse Rate: 71  (02/15 0405)  Labs:  Basename 01/04/12 0500 01/03/12 2131 01/03/12 1504 01/03/12 1241 01/03/12 0445 01/02/12 2315 01/02/12 1524  HGB 12.8 -- -- -- 11.7* -- --  HCT 38.0 -- -- -- 35.2* -- 37.6  PLT 153 -- -- -- 136* -- 165  APTT -- -- -- -- -- -- --  LABPROT 12.9 -- -- -- -- -- --  INR 0.95 -- -- -- -- -- --  HEPARINUNFRC 0.53 0.21* 0.26* -- -- -- --  CREATININE -- -- -- -- -- -- 0.70  CKTOTAL -- -- -- 49 43 51 --  CKMB -- -- -- 2.3 2.1 2.0 --  TROPONINI -- -- -- <0.30 <0.30 <0.30 --   Estimated Creatinine Clearance: 105.2 ml/min (by C-G formula based on Cr of 0.7).   Medications:  Infusions:    . sodium chloride 1 mL/kg/hr (01/04/12 0354)  . heparin 2,100 Units/hr (01/04/12 0234)    Assessment: 55 yof on IV heparin for CP. Heparin level is at goal at 0.53. No bleeding noted, CBC ok. Plan for cath today.   Goal of Therapy:  Heparin level 0.3-0.7 units/ml   Plan:  1. Continue heparin at 2100units/hr 2. F/u post-cath plans  Sinia Antosh, Drake Leach 01/04/2012,11:07 AM

## 2012-01-04 NOTE — Progress Notes (Signed)
Patient Name: Debra Graham Date of Encounter: 01/04/2012     Principal Problem:  *NSTEMI (non-ST elevated myocardial infarction)    SUBJECTIVE: Slept well. No chest pain, shortness of breath, palpitations or lightheadedness.    OBJECTIVE  Filed Vitals:   01/03/12 1000 01/03/12 1410 01/03/12 2057 01/04/12 0405  BP: 148/90 156/81 169/92 178/96  Pulse: 64 70 71 71  Temp: 97.2 F (36.2 C) 97.2 F (36.2 C) 99 F (37.2 C) 97.9 F (36.6 C)  TempSrc: Oral Oral Oral Oral  Resp: 14 16 18 18   Height:    5\' 6"  (1.676 m)  Weight:    120.8 kg (266 lb 5.1 oz)  SpO2: 97% 95% 98% 97%    Intake/Output Summary (Last 24 hours) at 01/04/12 0950 Last data filed at 01/04/12 0865  Gross per 24 hour  Intake 2573.56 ml  Output   2225 ml  Net 348.56 ml   Weight change: -0.9 kg (-1 lb 15.8 oz)  PHYSICAL EXAM  General: Well developed, obese, NAD Head: Normocephalic, atraumatic, sclera non-icteric, no xanthomas Neck: Supple without bruits or JVD. Lungs:  Resp regular and unlabored, CTAB without wheezes, rales or rhonchi Heart: RRR no s3, s4, or murmurs. Abdomen: Soft, non-tender, non-distended, BS + x 4.  Msk:  Strength and tone appears normal for age. Extremities: No clubbing, cyanosis or edema. DP/PT/Radials 2+ and equal bilaterally. Neuro: Alert and oriented X 3. Moves all extremities spontaneously. Psych: Normal affect.  LABS:  Recent Labs  Basename 01/04/12 0500 01/03/12 0445   WBC 6.2 6.4   HGB 12.8 11.7*   HCT 38.0 35.2*   MCV 86.0 85.9   PLT 153 136*    Lab 01/02/12 1524  NA 141  K 3.7  CL 106  CO2 26  BUN 15  CREATININE 0.70  CALCIUM 9.2  PROT 6.6  BILITOT 0.3  ALKPHOS 74  ALT 12  AST 15  AMYLASE --  LIPASE --  GLUCOSE 200*   Recent Labs  [facility] 01/02/12 1412   HGBA1C 9.9   Recent Labs  Basename 01/03/12 1241 01/03/12 0445 01/02/12 2315   CKTOTAL 49 43 51   CKMB 2.3 2.1 2.0   CKMBINDEX -- -- --   TROPONINI <0.30 <0.30 <0.30   No components  found with this basename: POCBNP Recent Labs  Basename 01/03/12 0445   CHOL 216*   HDL 59   LDLCALC 120*   TRIG 187*   CHOLHDL 3.7   LDLDIRECT --    Basename 01/02/12 2104  TSH 0.334*  T4TOTAL --  T3FREE --  THYROIDAB --   TELE: NSR, 70-80 bpm, occasional PACs  ECG: NSR, 70 bpm, up-sloping ST segments V3-V6, TWI in I and aVL, Q's vs poor R wave progression V1-V3, III and aVF; no changes from yesterday's tracing   Radiology/Studies:  Dg Chest 2 View  01/02/2012  *RADIOLOGY REPORT*  Clinical Data: Chest pain, abnormal EKG, shortness of breath.  CHEST - 2 VIEW  Comparison: CT chest 05/15/2011 and chest radiograph 08/01/2010.  Findings: Trachea is midline.  Heart is enlarged.  Right IJ power port tip projects over the SVC.  Linear scarring is seen in the left lung base. There may be new air space disease in the right infrahilar region.  No pleural fluid.  IMPRESSION: Question new air space disease in the right infrahilar region. Follow-up to clearing is recommended.  Original Report Authenticated By: Reyes Ivan, M.D.    Current Medications:     . amLODipine  10 mg Oral Daily  . aspirin EC  81 mg Oral Daily  . carvedilol  6.25 mg Oral BID WC  . diazepam  5 mg Oral On Call  . DULoxetine  60 mg Oral Daily  . furosemide  20 mg Oral Daily  . guaiFENesin  600 mg Oral BID  . insulin aspart  0-9 Units Subcutaneous TID WC  . lisinopril  40 mg Oral Daily  . magnesium sulfate 1 - 4 g bolus IVPB  2 g Intravenous Once  . oxyCODONE  5 mg Oral Once  . QUEtiapine  50 mg Oral QHS  . rosuvastatin  40 mg Oral Daily  . sodium chloride  3 mL Intravenous Q12H  . DISCONTD: carvedilol  6.25 mg Oral BID WC  . DISCONTD: sodium chloride  3 mL Intravenous Q12H    ASSESSMENT AND PLAN:  1. Chest pain- asymptomatic this morning. EKG without acute ischemic changes. CEs negative x 3. 2D echo reveals mild LVH, LVEF 35-40% and moderately dilated LA. Systolic function decreased from 2011 echo.   -  Cardiac catheterization planned for today.    2. Cardiomyopathy- cath today for ischemic evaluation. Patient also has longstanding, uncontrolled hypertension and cocaine use in the past. Denies shortness of breath, distention or LE swelling. Euvolemic on exam.   - Continue Lasix, ACEi, BB  - Will monitor  3. HTN- remains elevated this admission despite continuation of home antihypertensives   - May consider adding low-dose Imdur vs hydralazine for adequate BP control; however, am concerned with medication noncompliance on discharge  4. HL- on statin currently. LDL 120 on lipid panel ordered yesterday  - Continue Crestor  5. Type 2 DM- per primary team    Signed, R. Hurman Horn, PA-C 01/04/2012, 9:50 AM   Patient seen with PA, agree with note.  Left heart cath today.  If clean coronaries, suspect cardiomyopathy is secondary to poorly controlled BP.  Will titrate up Coreg and potentially add hydral/Imdur.   [name] 01/04/2012 1:07 PM

## 2012-01-04 NOTE — Procedures (Signed)
   Cardiac Catheterization Procedure Note  Name: Debra Graham MRN: [id number] DOB: 05/23/1956  Procedure: Left Heart Cath, Selective Coronary Angiography, LV angiography  Indication: Chest pain, abnormal echo, multiple CAD risk factors.    Procedural Details: The right wrist was prepped, draped, and anesthetized with 1% lidocaine. Using the modified Seldinger technique, a 5 French sheath was introduced into the right radial artery. 3 mg of verapamil was administered through the sheath, weight-based unfractionated heparin was administered intravenously. Standard Judkins catheters were used for selective coronary angiography and left ventriculography. Catheter exchanges were performed over an exchange length guidewire. There were no immediate procedural complications. A TR band was used for radial hemostasis at the completion of the procedure.  The patient was transferred to the post catheterization recovery area for further monitoring.  Procedural Findings: Hemodynamics: AO 208/104 LV 193/42  Coronary angiography: Coronary dominance: right  Left mainstem: Short left main.   Left anterior descending (LAD): Luminal irregularities in the LAD.   Left circumflex (LCx): Luminal irregularities in LCx.  Large ramus and large PLOM, both vessels with luminals.  Right coronary artery (RCA): Relatively small, codominant vessel with LCx.  Mild luminal irregularities.   Left ventriculography: Not done due to elevated LVEDP.   Final Conclusions:  No significant coronary disease.  Very elevated LV end diastolic pressure.  Will start NTG gtt and given IV Lasix.  Transfer to step-down for IV NTG and diuresis.    [name] 01/04/2012, 3:33 PM

## 2012-01-04 NOTE — Progress Notes (Signed)
Received request to review pt for possible assistance with medication due to pt noncompliance with medications. Per hospital registration, pt has both Medicare and Medicaid benefits, therefore is not eligible for assistance with meds. Manufacture programs would not be appropriate as pt has dual coverage. Perhaps a review of pt meds and possible replacement of more expensive medication with less expensive meds would be helpful for this pt. Dr Manson Passey informed.  Johny Shock RN MPH Case Manager [phone]

## 2012-01-04 NOTE — Progress Notes (Signed)
Subjective: Patient slept well overnight.  No further chest pain, though she does note persistent headache.  BP's have increased recently.  Objective: Vital signs in last 24 hours: Filed Vitals:   01/03/12 2057 01/04/12 0405 01/04/12 1017 01/04/12 1231  BP: 169/92 178/96 180/85 181/94  Pulse: 71 71  77  Temp: 99 F (37.2 C) 97.9 F (36.6 C)  98.2 F (36.8 C)  TempSrc: Oral Oral  Oral  Resp: 18 18  18   Height:  5\' 6"  (1.676 m)    Weight:  266 lb 5.1 oz (120.8 kg)    SpO2: 98% 97%  99%   Weight change: -1 lb 15.8 oz (-0.9 kg)  Intake/Output Summary (Last 24 hours) at 01/04/12 1306 Last data filed at 01/04/12 1002  Gross per 24 hour  Intake 2373.56 ml  Output   2400 ml  Net -26.44 ml   Physical Exam: General: alert, cooperative, and in no apparent distress HEENT: pupils equal round and reactive to light, vision grossly intact, oropharynx clear and non-erythematous  Neck: supple, no lymphadenopathy Lungs: clear to ascultation bilaterally, normal work of respiration, no wheezes, rales, ronchi Heart: regular rate and rhythm, no murmurs, gallops, or rubs Abdomen: soft, non-tender, non-distended, normal bowel sounds Extremities:  LE edema bilaterally Neurologic: alert & oriented X3, cranial nerves II-XII intact, strength grossly intact, sensation intact to light touch  Lab Results: Basic Metabolic Panel:  Lab 01/04/12 4782 01/02/12 1524  NA -- 141  K -- 3.7  CL -- 106  CO2 -- 26  GLUCOSE -- 200*  BUN -- 15  CREATININE -- 0.70  CALCIUM -- 9.2  MG 1.8 --  PHOS -- --   Liver Function Tests:  Lab 01/02/12 1524  AST 15  ALT 12  ALKPHOS 74  BILITOT 0.3  PROT 6.6  ALBUMIN 2.9*   CBC:  Lab 01/04/12 0500 01/03/12 0445 01/02/12 1524  WBC 6.2 6.4 --  NEUTROABS -- 3.4 5.3  HGB 12.8 11.7* --  HCT 38.0 35.2* --  MCV 86.0 85.9 --  PLT 153 136* --   Cardiac Enzymes:  Lab 01/03/12 1241 01/03/12 0445 01/02/12 2315  CKTOTAL 49 43 51  CKMB 2.3 2.1 2.0  CKMBINDEX --  -- --  TROPONINI <0.30 <0.30 <0.30   CBG:  Lab 01/04/12 1151 01/04/12 0611 01/03/12 2054 01/03/12 1648 01/03/12 1141 01/03/12 0545  GLUCAP 164* 240* 230* 232* 241* 206*   Hemoglobin A1C:  Lab 01/02/12 1412  HGBA1C 9.9   Fasting Lipid Panel:  Lab 01/03/12 0445  CHOL 216*  HDL 59  LDLCALC 120*  TRIG 187*  CHOLHDL 3.7  LDLDIRECT --   Thyroid Function Tests:  Lab 01/02/12 2104  TSH 0.334*  T4TOTAL --  FREET4 --  T3FREE --  THYROIDAB --    Medications: I have reviewed the patient's current medications. Scheduled Meds:    . amLODipine  10 mg Oral Daily  . aspirin EC  81 mg Oral Daily  . carvedilol  6.25 mg Oral BID WC  . diazepam  5 mg Oral On Call  . DULoxetine  60 mg Oral Daily  . furosemide  20 mg Oral Daily  . guaiFENesin  600 mg Oral BID  . hydrochlorothiazide  25 mg Oral Daily  . insulin aspart  0-9 Units Subcutaneous TID WC  . lisinopril  40 mg Oral Daily  . oxyCODONE  5 mg Oral Once  . QUEtiapine  50 mg Oral QHS  . rosuvastatin  40 mg Oral Daily  .  sodium chloride  3 mL Intravenous Q12H  . DISCONTD: carvedilol  6.25 mg Oral BID WC  . DISCONTD: magnesium sulfate 1 - 4 g bolus IVPB  2 g Intravenous Once  . DISCONTD: sodium chloride  3 mL Intravenous Q12H   Continuous Infusions:    . sodium chloride 1 mL/kg/hr (01/04/12 1154)  . heparin 2,100 Units/hr (01/04/12 0234)   PRN Meds:.sodium chloride, sodium chloride, acetaminophen, nitroGLYCERIN, ondansetron (ZOFRAN) IV, oxyCODONE-acetaminophen, sodium chloride, DISCONTD:  morphine injection, DISCONTD: sodium chloride  Assessment/Plan: The patient is a 56 yo woman, history of CHF, DM, HTN, HLD, prior colon cancer s/p subtotal colectomy, presenting with chest pain and elevated troponin, concerning for NSTEMI.  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  Patient found to have elevated  troponin x1, and old ST changes on EKG.  Patient started on heparin drip, going for cath today -cardiology consult, appreciate recs -continue heparin drip -continue aspirin, statin, nitro, morphine, and coreg -monitor via telemetry  # Hypertension - recently uptrending.   -Given history of financial barriers to medications, we are starting HCTZ, and can discharge her on HCTZ-lisinopril combo pill to avoid increasing the cost of her medications -continue amlodipine, coreg, lasix, lisinopril  # Diabetes - Hb A1C = 9.9, managed on home metformin 500 BID -SSI while inpatient -appreciate recs of diabetes coordinator.  Given procedure today, we will start lantus at a reduced dose of 10 units tonight, and adjust as needed  # Hyperlipidemia - LDL = 120, patient possibly non-compliant as outpatient -switched from atorvastatin 40 to crestor 40  # History of tobacco abuse - considering quitting -appreciate tobacco cessation counselor  # Port-a-Cath in place - implanted for chemo, scheduled for removal at [facility] on 2/20, patient desires to have it removed during this admission -patient currently on heparin drip, but going for cath today -consulted IR, will try to remove early next week (monday?) if possible before discharge  # Prophy - heparin drip   LOS: 2 days   [name] 01/04/2012, 1:06 PM

## 2012-01-05 DIAGNOSIS — I10 Essential (primary) hypertension: Secondary | ICD-10-CM

## 2012-01-05 LAB — BASIC METABOLIC PANEL
BUN: 16 mg/dL (ref 6–23)
BUN: 17 mg/dL (ref 6–23)
CO2: 30 mEq/L (ref 19–32)
Calcium: 9.1 mg/dL (ref 8.4–10.5)
Chloride: 102 mEq/L (ref 96–112)
Chloride: 98 mEq/L (ref 96–112)
Creatinine, Ser: 1.39 mg/dL — ABNORMAL HIGH (ref 0.50–1.10)
Creatinine, Ser: 1.07 mg/dL (ref 0.50–1.10)
GFR calc Af Amer: 48 mL/min — ABNORMAL LOW (ref >90)
GFR calc non Af Amer: 42 mL/min — ABNORMAL LOW (ref >90)
Potassium: 3.9 mEq/L (ref 3.5–5.1)

## 2012-01-05 LAB — CBC
MCH: 28.8 pg (ref 26.0–34.0)
MCHC: 33.3 g/dL (ref 30.0–36.0)
MCV: 86.5 fL (ref 78.0–100.0)
Platelets: 142 10*3/uL — ABNORMAL LOW (ref 150–400)
RDW: 13.4 % (ref 11.5–15.5)
WBC: 7.2 10*3/uL (ref 4.0–10.5)

## 2012-01-05 MED ORDER — FUROSEMIDE 40 MG PO TABS
40.0000 mg | ORAL_TABLET | Freq: Two times a day (BID) | ORAL | Status: DC
  Administered 2012-01-05 – 2012-01-08 (×6): 40 mg via ORAL
  Filled 2012-01-05 (×8): qty 1

## 2012-01-05 NOTE — Progress Notes (Signed)
Patient ID: Debra Graham, female   DOB: Nov 08, 1956, 56 y.o.   MRN: [id number]    SUBJECTIVE: Headache this morning.  She diuresed well yesterday with IV Lasix.  BP lower today.      Debra Graham amLODipine  10 mg Oral Daily  . aspirin EC  81 mg Oral Daily  . carvedilol  12.5 mg Oral BID WC  . DULoxetine  60 mg Oral Daily  . fentaNYL      . fentaNYL  25 mcg Intravenous Once  . furosemide  40 mg Intravenous BID  . guaiFENesin  600 mg Oral BID  . heparin      . heparin  5,000 Units Subcutaneous Q8H  . insulin aspart  0-9 Units Subcutaneous TID WC  . insulin glargine  10 Units Subcutaneous QHS  . lisinopril  40 mg Oral Daily  . midazolam      . nitroGLYCERIN      . QUEtiapine  50 mg Oral QHS  . rosuvastatin  40 mg Oral Daily  . sodium chloride  3 mL Intravenous Q12H  . sodium chloride  3 mL Intravenous Q12H  . verapamil      . DISCONTD: carvedilol  6.25 mg Oral BID WC  . DISCONTD: furosemide  20 mg Oral Daily  . DISCONTD: hydrochlorothiazide  25 mg Oral Daily  . DISCONTD: magnesium sulfate 1 - 4 g bolus IVPB  2 g Intravenous Once      Filed Vitals:   01/05/12 0016 01/05/12 0400 01/05/12 0500 01/05/12 0805  BP: 98/47 118/55    Pulse: 83 74    Temp:  98.1 F (36.7 C)  98.5 F (36.9 C)  TempSrc:  Oral  Oral  Resp: 12 12    Height:      Weight:   118.3 kg (260 lb 12.9 oz)   SpO2: 93% 97%      Intake/Output Summary (Last 24 hours) at 01/05/12 0920 Last data filed at 01/05/12 0700  Gross per 24 hour  Intake 628.83 ml  Output   4700 ml  Net -4071.17 ml    LABS: Basic Metabolic Panel:  Basename 01/05/12 0500 01/04/12 1552 01/04/12 1134 01/02/12 1524  NA 139 -- -- 141  K 3.8 -- -- 3.7  CL 102 -- -- 106  CO2 28 -- -- 26  GLUCOSE 205* -- -- 200*  BUN 16 -- -- 15  CREATININE 1.39* 0.62 -- --  CALCIUM 9.1 -- -- 9.2  MG -- -- 1.8 --  PHOS -- -- -- --   Liver Function Tests:  Basename 01/02/12 1524  AST 15  ALT 12  ALKPHOS 74  BILITOT 0.3  PROT 6.6  ALBUMIN 2.9*    No results found for this basename: LIPASE:2,AMYLASE:2 in the last 72 hours CBC:  Basename 01/05/12 0500 01/04/12 1552 01/03/12 0445 01/02/12 1524  WBC 7.2 6.8 -- --  NEUTROABS -- -- 3.4 5.3  HGB 11.7* 12.0 -- --  HCT 35.1* 35.2* -- --  MCV 86.5 85.2 -- --  PLT 142* 150 -- --   Cardiac Enzymes:  Basename 01/03/12 1241 01/03/12 0445 01/02/12 2315  CKTOTAL 49 43 51  CKMB 2.3 2.1 2.0  CKMBINDEX -- -- --  TROPONINI <0.30 <0.30 <0.30   BNP: No components found with this basename: POCBNP:3 D-Dimer: No results found for this basename: DDIMER:2 in the last 72 hours Hemoglobin A1C:  Basename 01/02/12 1412  HGBA1C 9.9   Fasting Lipid Panel:  Basename 01/03/12 0445  CHOL 216*  HDL  59  LDLCALC 120*  TRIG 187*  CHOLHDL 3.7  LDLDIRECT --   Thyroid Function Tests:  Basename 01/02/12 2104  TSH 0.334*  T4TOTAL --  T3FREE --  THYROIDAB --   Anemia Panel: No results found for this basename: VITAMINB12,FOLATE,FERRITIN,TIBC,IRON,RETICCTPCT in the last 72 hours  RADIOLOGY: Dg Chest 2 View  01/02/2012  *RADIOLOGY REPORT*  Clinical Data: Chest pain, abnormal EKG, shortness of breath.  CHEST - 2 VIEW  Comparison: CT chest 05/15/2011 and chest radiograph 08/01/2010.  Findings: Trachea is midline.  Heart is enlarged.  Right IJ power port tip projects over the SVC.  Linear scarring is seen in the left lung base. There may be new air space disease in the right infrahilar region.  No pleural fluid.  IMPRESSION: Question new air space disease in the right infrahilar region. Follow-up to clearing is recommended.  Original Report Authenticated By: Reyes Ivan, M.D.    PHYSICAL EXAM General: NAD Neck: JVP 8-9 cm, no thyromegaly or thyroid nodule.  Lungs: Crackles at bases bilaterally CV: Nondisplaced PMI.  Heart regular S1/S2, no S3/S4, no murmur.  No peripheral edema.  No carotid bruit.  Normal pedal pulses.  Abdomen: Soft, nontender, no hepatosplenomegaly, no distention.   Neurologic: Alert and oriented x 3.  Psych: Normal affect. Extremities: No clubbing or cyanosis.   TELEMETRY: Reviewed telemetry pt in NSR  ASSESSMENT AND PLAN:  56 yo with history of DM and uncontrolled HTN presented with chest pain.  Left heart cath yesterday showed no significant CAD but LVEDP was very elevated in the setting of significant HTN.   1. CHF: Acute systolic CHF.  EF 35-40% on echo, nonischemic cardiomyopathy.  ? May be due to uncontrolled HTN over a long period of time.  Creatinine is up today with good diuresis yesterday.  - IV Lasix this morning, change to po Lasix this evening.  - Continue Coreg and lisinopril, will add low dose spironolactone.  Stop HCTZ since on Lasix.  2. HTN: BP under better control.  SBP 120s-130s this morning.  Stop HCTZ and NTG gtt.  3. Renal: Creatinine rising with diuresis, change to po Lasix after this morning as above.   Debra Graham 01/05/2012 9:25 AM

## 2012-01-05 NOTE — Progress Notes (Addendum)
Subjective: Patient slept well overnight.  No further chest pain, though she does note headaches. She was on nitro drip which was discontinued this morning. No pain in the wrist at the site of procedure.   Objective: Vital signs in last 24 hours: Filed Vitals:   01/05/12 0800 01/05/12 0805 01/05/12 0900 01/05/12 1000  BP:      Pulse: 73  80 83  Temp:  98.5 F (36.9 C)    TempSrc:  Oral    Resp:      Height:      Weight:      SpO2: 98%  95% 97%   Weight change: -5 lb 8.2 oz (-2.5 kg)  Intake/Output Summary (Last 24 hours) at 01/05/12 1208 Last data filed at 01/05/12 1000  Gross per 24 hour  Intake 628.83 ml  Output   4300 ml  Net -3671.17 ml   Physical Exam: General: alert, cooperative, and in no apparent distress HEENT: pupils equal round and reactive to light, vision grossly intact, oropharynx clear and non-erythematous  Neck: supple, no lymphadenopathy, JVD elevated 9 cm Lungs: bibasilar crackles, normal work of respiration, no wheezes, rales, ronchi Heart: regular rate and rhythm, no murmurs, gallops, or rubs Abdomen: soft, non-tender, non-distended, normal bowel sounds Extremities: 1+ pitting edema b/l which is better then yesterday Neurologic: alert & oriented X3, cranial nerves II-XII intact, strength grossly intact, sensation intact to light touch  Lab Results: Basic Metabolic Panel:  Lab 01/05/12 4540 01/04/12 1552 01/04/12 1134 01/02/12 1524  NA 139 -- -- 141  K 3.8 -- -- 3.7  CL 102 -- -- 106  CO2 28 -- -- 26  GLUCOSE 205* -- -- 200*  BUN 16 -- -- 15  CREATININE 1.39* 0.62 -- --  CALCIUM 9.1 -- -- 9.2  MG -- -- 1.8 --  PHOS -- -- -- --   Liver Function Tests:  Lab 01/02/12 1524  AST 15  ALT 12  ALKPHOS 74  BILITOT 0.3  PROT 6.6  ALBUMIN 2.9*   CBC:  Lab 01/05/12 0500 01/04/12 1552 01/03/12 0445 01/02/12 1524  WBC 7.2 6.8 -- --  NEUTROABS -- -- 3.4 5.3  HGB 11.7* 12.0 -- --  HCT 35.1* 35.2* -- --  MCV 86.5 85.2 -- --  PLT 142* 150 -- --     Cardiac Enzymes:  Lab 01/03/12 1241 01/03/12 0445 01/02/12 2315  CKTOTAL 49 43 51  CKMB 2.3 2.1 2.0  CKMBINDEX -- -- --  TROPONINI <0.30 <0.30 <0.30   CBG:  Lab 01/05/12 0805 01/04/12 2152 01/04/12 1751 01/04/12 1407 01/04/12 1151 01/04/12 0611  GLUCAP 192* 178* 150* 142* 164* 240*   Hemoglobin A1C:  Lab 01/02/12 1412  HGBA1C 9.9   Fasting Lipid Panel:  Lab 01/03/12 0445  CHOL 216*  HDL 59  LDLCALC 120*  TRIG 187*  CHOLHDL 3.7  LDLDIRECT --   Thyroid Function Tests:  Lab 01/02/12 2104  TSH 0.334*  T4TOTAL --  FREET4 --  T3FREE --  THYROIDAB --    Medications: I have reviewed the patient's current medications. Scheduled Meds:    . amLODipine  10 mg Oral Daily  . aspirin EC  81 mg Oral Daily  . carvedilol  12.5 mg Oral BID WC  . DULoxetine  60 mg Oral Daily  . fentaNYL      . fentaNYL  25 mcg Intravenous Once  . furosemide  40 mg Oral BID  . guaiFENesin  600 mg Oral BID  . heparin      .  heparin  5,000 Units Subcutaneous Q8H  . insulin aspart  0-9 Units Subcutaneous TID WC  . insulin glargine  10 Units Subcutaneous QHS  . lisinopril  40 mg Oral Daily  . midazolam      . nitroGLYCERIN      . QUEtiapine  50 mg Oral QHS  . rosuvastatin  40 mg Oral Daily  . sodium chloride  3 mL Intravenous Q12H  . sodium chloride  3 mL Intravenous Q12H  . verapamil      . DISCONTD: carvedilol  6.25 mg Oral BID WC  . DISCONTD: furosemide  40 mg Intravenous BID  . DISCONTD: furosemide  20 mg Oral Daily  . DISCONTD: hydrochlorothiazide  25 mg Oral Daily   Continuous Infusions:    . sodium chloride Stopped (01/05/12 1000)  . sodium chloride 10 mL/hr at 01/04/12 1842  . DISCONTD: sodium chloride 1 mL/kg/hr (01/04/12 1154)  . DISCONTD: heparin 2,100 Units/hr (01/04/12 0234)  . 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  Patient found to have elevated troponin x1, and old ST changes on EKG.  Cath not suggestive of any critical stenosis but found to have elevated EDP. She needs aggressive BP control and diuresis to reduce stress on left heart. Maximize medical therapy at this time. Coreg at 12.5 bid.  #Acute on chronic systolic CHF: Patient diuresed close to 4 liter in last 24 hours but her creatinine has gone up significantly. We will have her to switch to oral dose today and follow up crt tomorrow am. I will have her stay in step down for 1 more day.  # Hypertension - Much better controlled today. -Given history of financial barriers to medications, we are starting HCTZ, and can discharge her on HCTZ-lisinopril combo pill to avoid increasing the cost of her medications -continue amlodipine, coreg, lasix, lisinopril  # Diabetes - Hb A1C = 9.9, managed on home metformin 500 BID -SSI while inpatient  # Hyperlipidemia - LDL = 120, patient possibly non-compliant as outpatient -switched from atorvastatin 40 to crestor 40  # History of tobacco abuse - considering quitting -appreciate tobacco cessation counselor  # Port-a-Cath in place - implanted for chemo, scheduled for removal at [facility] on 2/20, patient desires to have it removed during this admission -consulted IR, will try to remove early next week (monday?) if possible before discharge  # Prophy - heparin drip   LOS: 3 days   GARG,ANKIT 01/05/2012, 12:08 PM Internal Medicine Teaching Service Attending Note Date: 01/05/2012  Patient name: Debra Graham  Medical record number: [id number]  Date of birth: 1956-11-03    This patient has been seen and discussed with Dr. Eben Graham. Please see his note for complete details. I concur with his findings with the following additions/corrections: we will titrate up dose of blood pressure meds in order to attain goal BP, by first titrating up dose of carvedilol. Will watch patient today to see if any further chest pain events.  Ronal Maybury 01/05/2012, 1:25 PM

## 2012-01-06 DIAGNOSIS — I509 Heart failure, unspecified: Secondary | ICD-10-CM

## 2012-01-06 LAB — GLUCOSE, CAPILLARY
Glucose-Capillary: 211 mg/dL — ABNORMAL HIGH (ref 70–99)
Glucose-Capillary: 183 mg/dL — ABNORMAL HIGH (ref 70–99)
Glucose-Capillary: 248 mg/dL — ABNORMAL HIGH (ref 70–99)

## 2012-01-06 LAB — CBC
HCT: 36.9 % (ref 36.0–46.0)
MCV: 87.6 fL (ref 78.0–100.0)
RBC: 4.21 MIL/uL (ref 3.87–5.11)
RDW: 13.3 % (ref 11.5–15.5)
WBC: 6.3 10*3/uL (ref 4.0–10.5)

## 2012-01-06 LAB — BASIC METABOLIC PANEL
BUN: 19 mg/dL (ref 6–23)
CO2: 28 mEq/L (ref 19–32)
Chloride: 99 mEq/L (ref 96–112)
Creatinine, Ser: 1.03 mg/dL (ref 0.50–1.10)

## 2012-01-06 MED ORDER — SPIRONOLACTONE 12.5 MG HALF TABLET
12.5000 mg | ORAL_TABLET | Freq: Every day | ORAL | Status: DC
  Administered 2012-01-07 – 2012-01-08 (×2): 12.5 mg via ORAL
  Filled 2012-01-06 (×3): qty 1

## 2012-01-06 MED ORDER — LORAZEPAM 2 MG/ML IJ SOLN: 1.0000 mg | Freq: Once | INTRAMUSCULAR | Status: DC

## 2012-01-06 MED ORDER — INSULIN GLARGINE 100 UNIT/ML ~~LOC~~ SOLN
14.0000 [IU] | Freq: Every day | SUBCUTANEOUS | Status: DC
  Administered 2012-01-06 – 2012-01-07 (×2): 14 [IU] via SUBCUTANEOUS

## 2012-01-06 MED ORDER — INSULIN ASPART 100 UNIT/ML ~~LOC~~ SOLN
0.0000 [IU] | Freq: Three times a day (TID) | SUBCUTANEOUS | Status: DC
  Administered 2012-01-06: 3 [IU] via SUBCUTANEOUS
  Administered 2012-01-06: 2 [IU] via SUBCUTANEOUS
  Administered 2012-01-06: 3 [IU] via SUBCUTANEOUS
  Administered 2012-01-07 (×2): 2 [IU] via SUBCUTANEOUS
  Administered 2012-01-07 – 2012-01-08 (×3): 3 [IU] via SUBCUTANEOUS
  Filled 2012-01-06: qty 3

## 2012-01-06 MED ORDER — CEFAZOLIN SODIUM 1-5 GM-% IV SOLN
1.0000 g | INTRAVENOUS | Status: DC
  Filled 2012-01-06: qty 50

## 2012-01-06 NOTE — Progress Notes (Signed)
Patient ID: Debra Graham, female   DOB: [birth year], 56 y.o.   MRN: [id number]     SUBJECTIVE: "Tired."  Breathing better and BP under control.      [name] amLODipine  10 mg Oral Daily  . aspirin EC  81 mg Oral Daily  . carvedilol  12.5 mg Oral BID WC  . DULoxetine  60 mg Oral Daily  . furosemide  40 mg Oral BID  . guaiFENesin  600 mg Oral BID  . heparin  5,000 Units Subcutaneous Q8H  . insulin aspart  0-9 Units Subcutaneous TID WC  . insulin glargine  10 Units Subcutaneous QHS  . lisinopril  40 mg Oral Daily  . QUEtiapine  50 mg Oral QHS  . rosuvastatin  40 mg Oral Daily  . sodium chloride  3 mL Intravenous Q12H  . sodium chloride  3 mL Intravenous Q12H  . DISCONTD: insulin aspart  0-9 Units Subcutaneous TID WC      Filed Vitals:   01/06/12 0200 01/06/12 0406 01/06/12 0500 01/06/12 0843  BP:  139/71    Pulse:      Temp: 98.4 F (36.9 C) 98.8 F (37.1 C)  98.6 F (37 C)  TempSrc: Oral Oral  Oral  Resp:      Height:      Weight:   117.7 kg (259 lb 7.7 oz)   SpO2:  87% 92%     Intake/Output Summary (Last 24 hours) at 01/06/12 1027 Last data filed at 01/05/12 2239  Gross per 24 hour  Intake    483 ml  Output    800 ml  Net   -317 ml    LABS: Basic Metabolic Panel:  Basename 01/06/12 0458 01/05/12 1546 01/04/12 1134  NA 135 137 --  K 4.0 3.9 --  CL 99 98 --  CO2 28 30 --  GLUCOSE 219* 156* --  BUN 19 17 --  CREATININE 1.03 1.07 --  CALCIUM 9.5 9.7 --  MG -- -- 1.8  PHOS -- -- --   Liver Function Tests: No results found for this basename: AST:2,ALT:2,ALKPHOS:2,BILITOT:2,PROT:2,ALBUMIN:2 in the last 72 hours No results found for this basename: LIPASE:2,AMYLASE:2 in the last 72 hours CBC:  Basename 01/06/12 0458 01/05/12 0500  WBC 6.3 7.2  NEUTROABS -- --  HGB 11.6* 11.7*  HCT 36.9 35.1*  MCV 87.6 86.5  PLT 142* 142*   Cardiac Enzymes:  Basename 01/03/12 1241  CKTOTAL 49  CKMB 2.3  CKMBINDEX --  TROPONINI <0.30   BNP: No components found  with this basename: POCBNP:3 D-Dimer: No results found for this basename: DDIMER:2 in the last 72 hours Hemoglobin A1C: No results found for this basename: HGBA1C in the last 72 hours Fasting Lipid Panel: No results found for this basename: CHOL,HDL,LDLCALC,TRIG,CHOLHDL,LDLDIRECT in the last 72 hours Thyroid Function Tests: No results found for this basename: TSH,T4TOTAL,FREET3,T3FREE,THYROIDAB in the last 72 hours Anemia Panel: No results found for this basename: VITAMINB12,FOLATE,FERRITIN,TIBC,IRON,RETICCTPCT in the last 72 hours  RADIOLOGY: Dg Chest 2 View  01/02/2012  *RADIOLOGY REPORT*  Clinical Data: Chest pain, abnormal EKG, shortness of breath.  CHEST - 2 VIEW  Comparison: CT chest 05/15/2011 and chest radiograph 08/01/2010.  Findings: Trachea is midline.  Heart is enlarged.  Right IJ power port tip projects over the SVC.  Linear scarring is seen in the left lung base. There may be new air space disease in the right infrahilar region.  No pleural fluid.  IMPRESSION: Question new air space disease in the right  infrahilar region. Follow-up to clearing is recommended.  Original Report Authenticated By: Debra Graham, M.D.    PHYSICAL EXAM General: NAD Neck: JVP not elevated, no thyromegaly or thyroid nodule.  Lungs: Crackles at bases bilaterally CV: Nondisplaced PMI.  Heart regular S1/S2, no S3/S4, no murmur.  No peripheral edema.  No carotid bruit.  Normal pedal pulses.  Abdomen: Soft, nontender, no hepatosplenomegaly, no distention.  Neurologic: Alert and oriented x 3.  Psych: Normal affect. Extremities: No clubbing or cyanosis.   TELEMETRY: Reviewed telemetry pt in NSR  ASSESSMENT AND PLAN:  56 yo with history of DM and uncontrolled HTN presented with chest pain.  Left heart cath yesterday showed no significant CAD but LVEDP was very elevated in the setting of significant HTN.   1. CHF: Acute systolic CHF.  EF 35-40% on echo, nonischemic cardiomyopathy.  ? May be due to  uncontrolled HTN over a long period of time.  Creatinine is up today with good diuresis yesterday.  - Can decrease Lasix to 40 mg once daily at home.  - Continue Coreg and lisinopril, will add low dose spironolactone.   2. HTN: BP under better control.  SBP 120s-130s this morning.   3. Renal: Creatinine stable.   Debra Graham 01/06/2012 10:27 AM

## 2012-01-06 NOTE — Progress Notes (Signed)
Patient ID: Debra Graham, female   DOB: 1956-06-28, 56 y.o.   MRN: [id number]  Patient for port a cath removal - will plan to do so in am.  Patient has been off heparin and is no longer needed.  Procedure details discussed with patient in regard to removal of port with her apparent understanding.  Written consent obtained.  Plan for removal prior to discharge - planned for 2/18 am.  Patient requesting something prior to removal - explained risks with moderate sedation - elected for some ativan prior.  Heart examination today : RRR, Lungs : bibasilar crackles.

## 2012-01-06 NOTE — Progress Notes (Addendum)
Subjective: No acute events overnight.  Patient feels "tired" this morning.  No chest pain, mild right arm pain at site of IV's.  Objective: Vital signs in last 24 hours: Filed Vitals:   01/06/12 0200 01/06/12 0406 01/06/12 0500 01/06/12 0843  BP:  139/71    Pulse:      Temp: 98.4 F (36.9 C) 98.8 F (37.1 C)  98.6 F (37 C)  TempSrc: Oral Oral  Oral  Resp:      Height:      Weight:   259 lb 7.7 oz (117.7 kg)   SpO2:  87% 92%    Weight change: -1 lb 5.2 oz (-0.6 kg)  Intake/Output Summary (Last 24 hours) at 01/06/12 1052 Last data filed at 01/05/12 2239  Gross per 24 hour  Intake    483 ml  Output    800 ml  Net   -317 ml   Physical Exam: General: alert, cooperative, and in no apparent distress HEENT: pupils equal round and reactive to light, vision grossly intact, oropharynx clear and non-erythematous  Neck: supple, no lymphadenopathy Lungs: clear to ascultation bilaterally, normal work of respiration, no wheezes, rales, ronchi Heart: regular rate and rhythm, no murmurs, gallops, or rubs Abdomen: soft, non-tender, non-distended, normal bowel sounds Extremities:  LE edema bilaterally Neurologic: alert & oriented X3, cranial nerves II-XII intact, strength grossly intact, sensation intact to light touch  Lab Results: Basic Metabolic Panel:  Lab 01/06/12 4098 01/05/12 1546 01/04/12 1134  NA 135 137 --  K 4.0 3.9 --  CL 99 98 --  CO2 28 30 --  GLUCOSE 219* 156* --  BUN 19 17 --  CREATININE 1.03 1.07 --  CALCIUM 9.5 9.7 --  MG -- -- 1.8  PHOS -- -- --   Liver Function Tests:  Lab 01/02/12 1524  AST 15  ALT 12  ALKPHOS 74  BILITOT 0.3  PROT 6.6  ALBUMIN 2.9*   CBC:  Lab 01/06/12 0458 01/05/12 0500 01/03/12 0445 01/02/12 1524  WBC 6.3 7.2 -- --  NEUTROABS -- -- 3.4 5.3  HGB 11.6* 11.7* -- --  HCT 36.9 35.1* -- --  MCV 87.6 86.5 -- --  PLT 142* 142* -- --   Cardiac Enzymes:  Lab 01/03/12 1241 01/03/12 0445 01/02/12 2315  CKTOTAL 49 43 51  CKMB 2.3  2.1 2.0  CKMBINDEX -- -- --  TROPONINI <0.30 <0.30 <0.30   CBG:  Lab 01/06/12 0816 01/05/12 2143 01/05/12 1555 01/05/12 1251 01/05/12 0805 01/04/12 2152  GLUCAP 230* 185* 162* 257* 192* 178*   Hemoglobin A1C:  Lab 01/02/12 1412  HGBA1C 9.9   Fasting Lipid Panel:  Lab 01/03/12 0445  CHOL 216*  HDL 59  LDLCALC 120*  TRIG 187*  CHOLHDL 3.7  LDLDIRECT --   Thyroid Function Tests:  Lab 01/02/12 2104  TSH 0.334*  T4TOTAL --  FREET4 --  T3FREE --  THYROIDAB --    Medications: I have reviewed the patient's current medications. Scheduled Meds:    . amLODipine  10 mg Oral Daily  . aspirin EC  81 mg Oral Daily  . carvedilol  12.5 mg Oral BID WC  .  ceFAZolin (ANCEF) IV  1 g Intravenous Once  . DULoxetine  60 mg Oral Daily  . furosemide  40 mg Oral BID  . guaiFENesin  600 mg Oral BID  . heparin  5,000 Units Subcutaneous Q8H  . insulin aspart  0-9 Units Subcutaneous TID WC  . insulin glargine  10 Units  Subcutaneous QHS  . lisinopril  40 mg Oral Daily  . LORazepam  1 mg Intravenous Once  . QUEtiapine  50 mg Oral QHS  . rosuvastatin  40 mg Oral Daily  . sodium chloride  3 mL Intravenous Q12H  . sodium chloride  3 mL Intravenous Q12H  . spironolactone  12.5 mg Oral Daily  . DISCONTD: insulin aspart  0-9 Units Subcutaneous TID WC   Continuous Infusions:    . sodium chloride Stopped (01/05/12 1000)  . sodium chloride 10 mL/hr at 01/04/12 1842   PRN Meds:.sodium chloride, acetaminophen, nitroGLYCERIN, ondansetron (ZOFRAN) IV, oxyCODONE-acetaminophen, sodium chloride, sodium chloride  Assessment/Plan: The patient is a 56 yo woman, history of CHF, DM, HTN, HLD, prior colon cancer s/p subtotal colectomy, presenting with chest pain and elevated troponin, concerning for NSTEMI.  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  Patient found to have elevated  troponin x1, and old ST changes on EKG.  Cath 2/15 showed no critical vessel disease, but significant LV EDP.  BP's have improved with diuresis and uptitration of BP meds -cardiology following, appreciate recs -coreg increased to 12.5 BID -spironolactone 12.5 -continue lasix, amlodipine, lisinopril -transfer to telemetry  # Hypertension - currently well-controlled -lisinopril, lasix, coreg, amlodipine, spironolactone  # Diabetes - Hb A1C = 9.9, managed on home metformin 500 BID -SSI while inpatient -increase lantus to 14 units qhs given continued elevated cbg's  # Hyperlipidemia - LDL = 120, patient possibly non-compliant as outpatient -crestor 40  # History of tobacco abuse - considering quitting -appreciate tobacco cessation counselor  # Port-a-Cath in place - implanted for chemo, scheduled for removal at [facility] on 2/20, patient desires to have it removed during this admission -plan for IR removal tomorrow  # Prophy - heparin drip   LOS: 4 days   [name] 01/06/2012, 10:52 AM  Internal Medicine Teaching Service Attending Note Date: 01/06/2012  Patient name: Debra Graham  Medical record number: [id number]  Date of birth: 06-28-1956    This patient has been seen and discussed with the house staff. Please see their note for complete details. I concur with their findings with the following additions/corrections: appreciative of cardiology recommendations. We will decrease lasix to daily dosing and add potassium sparing diuretic. Patient is getting to goal for BP control. Will keep overnight to monitor current changes in BP medications. Will likely discharge home after portacath removal tomorrow.  [name] 01/06/2012, 1:34 PM

## 2012-01-07 ENCOUNTER — Inpatient Hospital Stay (HOSPITAL_COMMUNITY): Payer: Medicare Other

## 2012-01-07 LAB — GLUCOSE, CAPILLARY
Glucose-Capillary: 234 mg/dL — ABNORMAL HIGH (ref 70–99)
Glucose-Capillary: 190 mg/dL — ABNORMAL HIGH (ref 70–99)

## 2012-01-07 LAB — BASIC METABOLIC PANEL
BUN: 22 mg/dL (ref 6–23)
Chloride: 97 mEq/L (ref 96–112)
Glucose, Bld: 242 mg/dL — ABNORMAL HIGH (ref 70–99)
Potassium: 4.3 mEq/L (ref 3.5–5.1)

## 2012-01-07 LAB — CBC
HCT: 35.7 % — ABNORMAL LOW (ref 36.0–46.0)
Hemoglobin: 11.8 g/dL — ABNORMAL LOW (ref 12.0–15.0)
RBC: 4.13 MIL/uL (ref 3.87–5.11)
WBC: 5.8 10*3/uL (ref 4.0–10.5)

## 2012-01-07 MED ORDER — INSULIN PEN STARTER KIT
1.0000 | Freq: Once | Status: DC
  Filled 2012-01-07 (×2): qty 1

## 2012-01-07 MED ORDER — LORAZEPAM 2 MG/ML IJ SOLN
INTRAMUSCULAR | Status: AC
  Filled 2012-01-07: qty 1

## 2012-01-07 MED ORDER — LORAZEPAM 2 MG/ML PO CONC
1.0000 mg | Freq: Once | ORAL | Status: DC
  Administered 2012-01-07: 1 mg via ORAL

## 2012-01-07 MED ORDER — LORAZEPAM 2 MG/ML PO CONC: 1.0000 mg | Freq: Once | ORAL | Status: DC

## 2012-01-07 NOTE — Progress Notes (Addendum)
Subjective: No acute events overnight.  Patient to IR this am for port-a-cath removal, somewhat sedated after procedure likely due to ativan.  No chest pain, headache, SOB.  Objective: Vital signs in last 24 hours: Filed Vitals:   01/07/12 1025 01/07/12 1044 01/07/12 1055 01/07/12 1132  BP: 141/66  149/72 148/85  Pulse: 69  71 72  Temp:    97.8 F (36.6 C)  TempSrc:    Oral  Resp: 20  18 18   Height:      Weight:      SpO2: 93% 100% 98% 97%   Weight change:   Intake/Output Summary (Last 24 hours) at 01/07/12 1235 Last data filed at 01/07/12 0900  Gross per 24 hour  Intake    483 ml  Output    350 ml  Net    133 ml   Physical Exam: General: alert, cooperative, and in no apparent distress HEENT: pupils equal round and reactive to light, vision grossly intact, oropharynx clear and non-erythematous  Neck: supple, no lymphadenopathy Lungs: clear to ascultation bilaterally, normal work of respiration, no wheezes, rales, ronchi Heart: regular rate and rhythm, no murmurs, gallops, or rubs Abdomen: soft, non-tender, non-distended, normal bowel sounds Extremities:  LE edema bilaterally Neurologic: alert & oriented X3, cranial nerves II-XII intact, strength grossly intact, sensation intact to light touch  Lab Results: Basic Metabolic Panel:  Lab 01/07/12 1191 01/06/12 0458 01/04/12 1134  NA 134* 135 --  K 4.3 4.0 --  CL 97 99 --  CO2 28 28 --  GLUCOSE 242* 219* --  BUN 22 19 --  CREATININE 0.99 1.03 --  CALCIUM 9.6 9.5 --  MG -- -- 1.8  PHOS -- -- --   Liver Function Tests:  Lab 01/02/12 1524  AST 15  ALT 12  ALKPHOS 74  BILITOT 0.3  PROT 6.6  ALBUMIN 2.9*   CBC:  Lab 01/07/12 0515 01/06/12 0458 01/03/12 0445 01/02/12 1524  WBC 5.8 6.3 -- --  NEUTROABS -- -- 3.4 5.3  HGB 11.8* 11.6* -- --  HCT 35.7* 36.9 -- --  MCV 86.4 87.6 -- --  PLT 148* 142* -- --   Cardiac Enzymes:  Lab 01/03/12 1241 01/03/12 0445 01/02/12 2315  CKTOTAL 49 43 51  CKMB 2.3 2.1 2.0    CKMBINDEX -- -- --  TROPONINI <0.30 <0.30 <0.30   CBG:  Lab 01/07/12 1144 01/07/12 0747 01/06/12 2145 01/06/12 1636 01/06/12 1241 01/06/12 0816  GLUCAP 190* 234* 248* 183* 211* 230*   Hemoglobin A1C:  Lab 01/02/12 1412  HGBA1C 9.9   Fasting Lipid Panel:  Lab 01/03/12 0445  CHOL 216*  HDL 59  LDLCALC 120*  TRIG 187*  CHOLHDL 3.7  LDLDIRECT --   Thyroid Function Tests:  Lab 01/02/12 2104  TSH 0.334*  T4TOTAL --  FREET4 --  T3FREE --  THYROIDAB --    Medications: I have reviewed the patient's current medications. Scheduled Meds:    . amLODipine  10 mg Oral Daily  . aspirin EC  81 mg Oral Daily  . carvedilol  12.5 mg Oral BID WC  . DULoxetine  60 mg Oral Daily  . furosemide  40 mg Oral BID  . guaiFENesin  600 mg Oral BID  . heparin  5,000 Units Subcutaneous Q8H  . insulin aspart  0-9 Units Subcutaneous TID WC  . insulin glargine  14 Units Subcutaneous QHS  . lisinopril  40 mg Oral Daily  . LORazepam      . QUEtiapine  50 mg Oral QHS  . rosuvastatin  40 mg Oral Daily  . sodium chloride  3 mL Intravenous Q12H  . sodium chloride  3 mL Intravenous Q12H  . spironolactone  12.5 mg Oral Daily  . DISCONTD:  ceFAZolin (ANCEF) IV  1 g Intravenous On Call  . DISCONTD: LORazepam  1 mg Oral Once  . DISCONTD: LORazepam  1 mg Oral Once  . DISCONTD: LORazepam  1 mg Intravenous Once   Continuous Infusions:    . sodium chloride Stopped (01/05/12 1000)  . sodium chloride 10 mL/hr at 01/04/12 1842   PRN Meds:.sodium chloride, acetaminophen, nitroGLYCERIN, ondansetron (ZOFRAN) IV, oxyCODONE-acetaminophen, sodium chloride, sodium chloride  Assessment/Plan: The patient is a 56 yo woman, history of CHF, DM, HTN, HLD, prior colon cancer s/p subtotal colectomy, presenting with chest pain and elevated troponin, concerning for NSTEMI.  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  Patient found to have elevated troponin x1, and old ST changes on EKG.  Cath 2/15 showed no critical vessel disease, but significant LV EDP.  BP's have improved with diuresis and uptitration of BP meds -cardiology following, appreciate recs -coreg 12.5 BID -spironolactone 12.5 -continue lasix, amlodipine, lisinopril  # Hypertension - currently well-controlled -lisinopril, lasix, coreg, amlodipine, spironolactone  # Diabetes - Hb A1C = 9.9, managed on home metformin 500 BID -SSI while inpatient -increase lantus to 18 units, discharge on lantus  # Hyperlipidemia - LDL = 120, patient possibly non-compliant as outpatient -crestor 40  # History of tobacco abuse - considering quitting -appreciate tobacco cessation counselor  # Port-a-Cath in place - s/p removal -removed today  # Prophy - SCD's  # Dispo - home today, with outpatient follow-up   LOS: 5 days   Debra Graham 01/07/2012, 12:35 PM Internal Medicine Teaching Service Attending Note Date: 01/07/2012  Patient name: Debra Graham  Medical record number: [id number]  Date of birth: Nov 30, 1955    This patient has been seen and discussed with Dr. Manson Passey. Please see his note for complete details. I concur with his findings.  [name] 01/07/2012, 4:33 PM

## 2012-01-07 NOTE — Discharge Instructions (Signed)

## 2012-01-07 NOTE — Procedures (Signed)
Successful removal of right jugular approach port-a-cath. No immediate post procedural complications.

## 2012-01-08 LAB — CBC
HCT: 34.4 % — ABNORMAL LOW (ref 36.0–46.0)
Hemoglobin: 11.3 g/dL — ABNORMAL LOW (ref 12.0–15.0)
MCH: 29 pg (ref 26.0–34.0)
MCHC: 32.8 g/dL (ref 30.0–36.0)
MCV: 88.2 fL (ref 78.0–100.0)
RDW: 13.5 % (ref 11.5–15.5)

## 2012-01-08 LAB — GLUCOSE, CAPILLARY: Glucose-Capillary: 238 mg/dL — ABNORMAL HIGH (ref 70–99)

## 2012-01-08 MED ORDER — INSULIN GLARGINE 100 UNIT/ML ~~LOC~~ SOLN: 14.0000 [IU] | Freq: Every day | SUBCUTANEOUS | Status: DC

## 2012-01-08 MED ORDER — ROSUVASTATIN CALCIUM 40 MG PO TABS: 40.0000 mg | ORAL_TABLET | Freq: Every day | ORAL | Status: DC

## 2012-01-08 MED ORDER — PEN NEEDLES 31G X 6 MM MISC: 1.0000 [IU] | Freq: Every day | Status: DC

## 2012-01-08 MED ORDER — FUROSEMIDE 20 MG PO TABS: ORAL_TABLET | ORAL | Status: DC

## 2012-01-08 MED ORDER — LORAZEPAM 1 MG PO TABS: 1.0000 mg | ORAL_TABLET | Freq: Once | ORAL | Status: DC

## 2012-01-08 MED ORDER — SPIRONOLACTONE 12.5 MG HALF TABLET: 12.5000 mg | ORAL_TABLET | Freq: Every day | ORAL | Status: DC

## 2012-01-08 NOTE — Progress Notes (Signed)
   CARE MANAGEMENT NOTE 01/08/2012  Patient:  Debra Graham, Debra Graham   Account Number:  [id number]  Date Initiated:  01/04/2012  Documentation initiated by:  ROYAL,CHERYL  Subjective/Objective Assessment:   Referral for assistance with meds, due to pt noncompliance.     Action/Plan:   Pt has Medicare and Medicaid therefore has significant medical and medication coverage. Perhaps a review of pt meds for less expensive replacements would be appropriate.   Anticipated DC Date:  01/08/2012   Anticipated DC Plan:  HOME W HOME HEALTH SERVICES      DC Planning Services  CM consult  Medication Assistance      Choice offered to / List presented to:             Status of service:  Completed, signed off Medicare Important Message given?   (If response is "NO", the following Medicare IM given date fields will be blank) Date Medicare IM given:   Date Additional Medicare IM given:    Discharge Disposition:  HOME/SELF CARE  Per UR Regulation:    Comments:  01/08/2012 1500 No further d/c needs identified. Isidoro Donning RN CCM Case Mgmt phone [phone]

## 2012-01-08 NOTE — Progress Notes (Signed)
Pt was provided with discharge instructions, new medication education and insulin starter kit. Pt verbalized and demonstrated understanding. IV removed. No distress at this time. Pt left floor in wheelchair with no needs. Ramond Craver, RN

## 2012-01-08 NOTE — Progress Notes (Signed)
Subjective: No acute events overnight.  Patient slept well.  States she feels somewhat "drained" this morning from everything she's been through.  Objective: Vital signs in last 24 hours: Filed Vitals:   01/07/12 1430 01/07/12 1744 01/07/12 2100 01/08/12 0500  BP: 145/69 135/85 122/78 106/70  Pulse: 70 71 67 62  Temp: 98.6 F (37 C)  98.3 F (36.8 C) 97.8 F (36.6 C)  TempSrc: Oral  Oral Oral  Resp: 18  20 18   Height:      Weight:    260 lb 9.3 oz (118.2 kg)  SpO2: 96%  98% 90%   Weight change:   Intake/Output Summary (Last 24 hours) at 01/08/12 0847 Last data filed at 01/07/12 2202  Gross per 24 hour  Intake    483 ml  Output      0 ml  Net    483 ml   Physical Exam: General: alert, cooperative, and in no apparent distress HEENT: pupils equal round and reactive to light, vision grossly intact, oropharynx clear and non-erythematous  Neck: supple, no lymphadenopathy Lungs: clear to ascultation bilaterally, normal work of respiration, no wheezes, rales, ronchi Heart: regular rate and rhythm, no murmurs, gallops, or rubs Abdomen: soft, non-tender, non-distended, normal bowel sounds Extremities:  LE edema bilaterally Neurologic: alert & oriented X3, cranial nerves II-XII intact, strength grossly intact, sensation intact to light touch  Lab Results: Basic Metabolic Panel:  Lab 01/07/12 1610 01/06/12 0458 01/04/12 1134  NA 134* 135 --  K 4.3 4.0 --  CL 97 99 --  CO2 28 28 --  GLUCOSE 242* 219* --  BUN 22 19 --  CREATININE 0.99 1.03 --  CALCIUM 9.6 9.5 --  MG -- -- 1.8  PHOS -- -- --   Liver Function Tests:  Lab 01/02/12 1524  AST 15  ALT 12  ALKPHOS 74  BILITOT 0.3  PROT 6.6  ALBUMIN 2.9*   CBC:  Lab 01/08/12 0530 01/07/12 0515 01/03/12 0445 01/02/12 1524  WBC 5.4 5.8 -- --  NEUTROABS -- -- 3.4 5.3  HGB 11.3* 11.8* -- --  HCT 34.4* 35.7* -- --  MCV 88.2 86.4 -- --  PLT 141* 148* -- --   Cardiac Enzymes:  Lab 01/03/12 1241 01/03/12 0445 01/02/12  2315  CKTOTAL 49 43 51  CKMB 2.3 2.1 2.0  CKMBINDEX -- -- --  TROPONINI <0.30 <0.30 <0.30   CBG:  Lab 01/08/12 0740 01/07/12 2014 01/07/12 1650 01/07/12 1144 01/07/12 0747 01/06/12 2145  GLUCAP 207* 209* 198* 190* 234* 248*   Hemoglobin A1C:  Lab 01/02/12 1412  HGBA1C 9.9   Fasting Lipid Panel:  Lab 01/03/12 0445  CHOL 216*  HDL 59  LDLCALC 120*  TRIG 187*  CHOLHDL 3.7  LDLDIRECT --   Thyroid Function Tests:  Lab 01/02/12 2104  TSH 0.334*  T4TOTAL --  FREET4 --  T3FREE --  THYROIDAB --    Medications: I have reviewed the patient's current medications. Scheduled Meds:    . amLODipine  10 mg Oral Daily  . aspirin EC  81 mg Oral Daily  . carvedilol  12.5 mg Oral BID WC  . DULoxetine  60 mg Oral Daily  . Flexpen Starter Kit  1 kit Other Once  . furosemide  40 mg Oral BID  . guaiFENesin  600 mg Oral BID  . insulin aspart  0-9 Units Subcutaneous TID WC  . insulin glargine  14 Units Subcutaneous QHS  . lisinopril  40 mg Oral Daily  .  LORazepam      . LORazepam  1 mg Oral Once  . QUEtiapine  50 mg Oral QHS  . rosuvastatin  40 mg Oral Daily  . sodium chloride  3 mL Intravenous Q12H  . sodium chloride  3 mL Intravenous Q12H  . spironolactone  12.5 mg Oral Daily  . DISCONTD: LORazepam  1 mg Oral Once  . DISCONTD: LORazepam  1 mg Oral Once   Continuous Infusions:    . sodium chloride Stopped (01/05/12 1000)  . sodium chloride 10 mL/hr at 01/04/12 1842   PRN Meds:.sodium chloride, acetaminophen, nitroGLYCERIN, ondansetron (ZOFRAN) IV, oxyCODONE-acetaminophen, sodium chloride, sodium chloride  Assessment/Plan: The patient is a 56 yo woman, history of CHF, DM, HTN, HLD, prior colon cancer s/p subtotal colectomy, presenting with chest pain and elevated troponin, with cath showing increased LV EDP.  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   Patient found to have elevated troponin x1, and old ST changes on EKG.  Cath 2/15 showed no critical vessel disease, but significant LV EDP.  BP's have improved with diuresis and uptitration of BP meds -cardiology following, appreciate recs -coreg 12.5 BID -spironolactone 12.5 -decrease lasix to 40 qam, 20 qpm at discharge -continue amlodipine, lisinopril  # Hypertension - currently well-controlled -lisinopril, lasix, coreg, amlodipine, spironolactone  # Diabetes - Hb A1C = 9.9, managed on home metformin 500 BID -SSI while inpatient -lantus 18 units, discharge on lantus  # Hyperlipidemia - LDL = 120, patient possibly non-compliant as outpatient -crestor 40  # History of tobacco abuse - considering quitting -appreciate tobacco cessation counselor  # Port-a-Cath in place - s/p removal 2/18  # Prophy - Lovenox  # Dispo - home today, with outpatient follow-up   LOS: 6 days   [name] 01/08/2012, 8:47 AM

## 2012-01-08 NOTE — Progress Notes (Signed)
Patient ID: Debra Graham, female   DOB: 05/28/1956, 56 y.o.   MRN: [id number]   Patient doing well.  BP under control and volume ok.  Plan for discharge today.  Continue current meds except would decrease Lasix to 40 qam, 20 qpm.  We will arrange cardiology followup.   [name] 01/08/2012 8:26 AM

## 2012-01-09 ENCOUNTER — Inpatient Hospital Stay (HOSPITAL_COMMUNITY): Admission: RE | Admit: 2012-01-09 | Payer: Medicare Other | Source: Ambulatory Visit

## 2012-01-09 LAB — CULTURE, BLOOD (ROUTINE X 2): Culture  Setup Time: 201302140457

## 2012-01-12 NOTE — Discharge Summary (Signed)
Internal Medicine Teaching [facility] Discharge Note  Name: Debra Graham MRN: [id number] DOB: September 19, 1956 56 y.o.  Date of Admission: 01/02/2012  3:02 PM Date of Discharge: 01/08/2012 Attending Physician: [name]  Discharge Diagnosis: 1. NSTEMI - type 2, with increased LV EDP on cath 2. Hypertension - increased dose of lasix, and added spironolactone 3. Diabetes - Hb A1C = 9.9, started Lantus at 18 units 4. Hyperlipidemia - stopped atorvastatin, started rosuvastatin 5. History of colon cancer - port-a-cath removal performed via IR  Discharge Medications: Medication List  As of 01/12/2012 12:02 PM   STOP taking these medications         atorvastatin 40 MG tablet         TAKE these medications         amLODipine 10 MG tablet   Commonly known as: NORVASC   Take 1 tablet (10 mg total) by mouth daily.      carvedilol 6.25 MG tablet   Commonly known as: COREG   Take 1 tablet (6.25 mg total) by mouth 2 (two) times daily with a meal.      DULoxetine 30 MG capsule   Commonly known as: CYMBALTA   Take 2 capsules (60 mg total) by mouth daily.      furosemide 20 MG tablet   Commonly known as: LASIX   Take 40 mg (2 tablets) daily in the morning, and 20 mg (1 tablet) daily in the evening.      guaiFENesin 600 MG 12 hr tablet   Commonly known as: MUCINEX   Take 1 tablet (600 mg total) by mouth 2 (two) times daily.      insulin glargine 100 UNIT/ML injection   Commonly known as: LANTUS   Inject 14 Units into the skin at bedtime.      lisinopril 40 MG tablet   Commonly known as: PRINIVIL,ZESTRIL   Take 1 tablet (40 mg total) by mouth daily.      metFORMIN 500 MG tablet   Commonly known as: GLUCOPHAGE   Take 1 tablet (500 mg total) by mouth 2 (two) times daily with a meal.      nitroGLYCERIN 0.4 MG SL tablet   Commonly known as: NITROSTAT   Place 1 tablet (0.4 mg total) under the tongue every 5 (five) minutes. For chest pain. If more than 2 doses needed, call 911.       Pen Needles 31G X 6 MM Misc   1 Units by Does not apply route daily.      QUEtiapine 50 MG tablet   Commonly known as: SEROQUEL   Take 50 mg by mouth at bedtime.      rosuvastatin 40 MG tablet   Commonly known as: CRESTOR   Take 1 tablet (40 mg total) by mouth daily.      spironolactone 12.5 mg Tabs   Commonly known as: ALDACTONE   Take 0.5 tablets (12.5 mg total) by mouth daily.      traMADol 50 MG tablet   Commonly known as: ULTRAM   Take 1 tablet (50 mg total) by mouth every 6 (six) hours as needed. For pain      traZODone 50 MG tablet   Commonly known as: DESYREL   Take 50 mg by mouth at bedtime as needed. As needed for sleep.            Disposition and follow-up:   [name] was discharged from [facility] in stable and improved condition, with resolution  of chest pain, and improved control of blood pressure.  The patient will follow-up with [facility] on 01/18/12.  The patient will also follow-up with Dr. Bosie Clos on 3/1.  The patient's BP should be monitored, and medications adjusted to achieve optimal BP control (consider increasing Coreg as 1st-line treatment).  The patient should also have a BMET checked to evaluate K and Cr in the setting of increasing the patient's Lasix from 20 mg daily to 40 mg qam and 20 mg qpm.  Follow-up Appointments: Discharge Orders    Future Appointments: Provider: Department: Dept Phone: Center:   01/18/2012 11:30 AM Beatrice Lecher, PA [facility] [place] [phone] [facility]   01/18/2012 3:45 PM Kristie Cowman, MD [facility] [phone] [facility]   04/21/2012 9:00 AM Devonne Doughty Dory Larsen [facility] [phone] None   04/21/2012 9:30 AM [facility] [phone] [place]   04/25/2012 10:00 AM Jethro Bolus, MD [facility] [phone] None     Future Orders Please Complete By Expires   Diet - low sodium heart healthy      Increase activity slowly      Discharge instructions      Comments:    You were hospitalized with chest pain.  The best thing to do to minimize your risk of future heart problems is to control your blood pressure, cholesterol, and diabetes. For your blood pressure, we are increasing your Lasix to 40 mg in the morning, and 20 mg in the evening.  We are also adding Spironolactone, 1 tablet daily. For your cholesterol, stop taking Lipitor, and start taking Crestor, 1 tablet daily, for better control of your cholesterol. For your diabetes, we are starting Lantus insulin.  Inject 14 units under the skin daily at bedtime.   Call MD for:  temperature >100.4      Call MD for:  persistant nausea and vomiting      Call MD for:  severe uncontrolled pain         Consultations: Treatment Team:  Rounding [facility], MD  Procedures Performed:  Dg Chest 2 View  01/02/2012  *RADIOLOGY REPORT*  Clinical Data: Chest pain, abnormal EKG, shortness of breath.  CHEST - 2 VIEW  Comparison: CT chest 05/15/2011 and chest radiograph 08/01/2010.  Findings: Trachea is midline.  Heart is enlarged.  Right IJ power port tip projects over the SVC.  Linear scarring is seen in the left lung base. There may be new air space disease in the right infrahilar region.  No pleural fluid.  IMPRESSION: Question new air space disease in the right infrahilar region. Follow-up to clearing is recommended.  Original Report Authenticated By: Reyes Ivan, M.D.   Ir Removal [employer] W/o Fl Mod Sed  01/07/2012  *RADIOLOGY REPORT*  Clinical Data:  No longer in need of Port-A-Cath  REMOVAL OF IMPLANTED TUNNELED PORT-A-CATH  Intravenous medications: Ativan 1 gm IV given before initiation of the procedure.  Fluoroscopy time: None  Complications: None immediate  Findings / Procedure:  Informed written consent  Informed written consent was obtained from the patient after a discussion of the risk, benefits and alternatives to the procedure. The patient was positioned supine on the fluoroscopy table and the  right chest Port-A-Cath site was prepped with chlorhexidine.  A sterile gown and gloves were worn during the procedure.  Local anesthesia was provided with 1% lidocaine with epinephrine.  A timeout was performed prior to the initiation of the procedure.  An incision was made overlying  the Port-A-Cath with a #15 scalpel. Utilizing sharp and blunt dissection, the Port-A-Cath was removed completely.  The pocked was irrigated with sterile saline.  Wound closure was performed with subcutaneous 3-0 Monocryl, subcuticular 4-0 Vicryl, Dermabond and Steri-Strips.  A dressing was placed. The patient tolerated the procedure well without immediate post procedural complication.  IMPRESSION:  Successful removal of implanted Port-A-Cath.  Original Report Authenticated By: Waynard Reeds, M.D.    2D Echo: 01/03/12 Study Conclusions - Left ventricle: Wall thickness was increased in a pattern of mild LVH. Systolic function was moderately reduced. The estimated ejection fraction was in the range of 35% to 40%. - Left atrium: The atrium was moderately dilated.    Cardiac Cath: 01/04/12 Procedural Details: The right wrist was prepped, draped, and anesthetized with 1% lidocaine. Using the modified Seldinger technique, a 5 French sheath was introduced into the right radial artery. 3 mg of verapamil was administered through the sheath, weight-based unfractionated heparin was administered intravenously. Standard Judkins catheters were used for selective coronary angiography and left ventriculography. Catheter exchanges were performed over an exchange length guidewire. There were no immediate procedural complications. A TR band was used for radial hemostasis at the completion of the procedure. The patient was transferred to the post catheterization recovery area for further monitoring.  Procedural Findings:  Hemodynamics:  AO 208/104  LV 193/42  Coronary angiography:  Coronary dominance: right  Left mainstem: Short left  main.  Left anterior descending (LAD): Luminal irregularities in the LAD.  Left circumflex (LCx): Luminal irregularities in LCx. Large ramus and large PLOM, both vessels with luminals.  Right coronary artery (RCA): Relatively small, codominant vessel with LCx. Mild luminal irregularities.  Left ventriculography: Not done due to elevated LVEDP.  Final Conclusions: No significant coronary disease. Very elevated LV end diastolic pressure. Will start NTG gtt and given IV Lasix. Transfer to step-down for IV NTG and diuresis.    Admission HPI:  This is a 56 year old female with PMH of CHF, DM, HTN, HLD, medical noncompliance and colon cancer with S./P. subtotal colectomy in 2009 presents with chest pain. Patient states that chest pain and chest pressure this afternoon. Her chest pain is located at right side and mid-sternum of her chest, 5/10, constant, dull/pressure like pain with radiation to her shoulder blades. Patient reports that she has had chronic migraine headache likely due to uncontrolled blood pressure for 3-4 weeks, which flared up today and she did not pay attention to her chest pain until her migraine subsided after BC powder. Patient came to [facility] clinic and was evaluated by Dr. Kristie Cowman (refer to Dr. Bosie Clos note) who found that patient had some EKG changes and sent her to ED for further evaluation. Patient was noted to have "Question new airspace disease in the right infrahilar region." in ED  Denies fever, or sore throat. No shortness of breath or dyspnea on exertion. No palpitation. No nausea or vomiting. No melena or incontinence. No muscle weakness. Denies depression. No appetite or weight changes.  Of note, patient has had chronic intermittent sharp/shooting chest pain since 2007 when she was diagnosed with CHF. Her intermittent chest pain usually last for seconds and relieved with nitroglycerin. She has not seen a cardiologist for years.  Patient also endorses "cold-like"symptoms  for over two month with mild whitish/greenish sputum accompanied with nasal congestion. Patient took some over-the-counter cold medication and did not seek any medical attention.  Admission Physical Exam Blood pressure 159/86, pulse 64, temperature 98.7 F (37.1 C),  temperature source Oral, resp. rate 18, height 5\' 6"  (1.676 m), weight 268 lb 4.8 oz (121.7 kg), SpO2 98.00%.  General: alert, well-developed, and cooperative to examination.  Head: normocephalic and atraumatic.  Eyes: vision grossly intact, pupils equal, pupils round, pupils reactive to light, no injection and anicteric.  Mouth: pharynx pink and moist, no erythema, and no exudates.  Neck: supple, full ROM, no thyromegaly, no JVD, and no carotid bruits.  Lungs: normal respiratory effort, no accessory muscle use,B/L lung sounds diminished, no crackles, and no wheezes. Heart: normal rate, regular rhythm, no murmur, no gallop, and no rub.  Abdomen:central obesity, soft, non-tender, normal bowel sounds, no distention, no guarding, no rebound tenderness, no hepatomegaly, and no splenomegaly.  Msk: no joint swelling, no joint warmth, and no redness over joints.  Pulses: 1+ DP/PT pulses bilaterally Extremities: No cyanosis, clubbing, edema Neurologic: alert & oriented X3, cranial nerves II-XII intact, strength normal in all extremities, sensation intact to light touch, and gait normal.  Skin: turgor normal and no rashes.  Psych: Oriented X3, memory intact for recent and remote, normally interactive, good eye contact, not anxious appearing, and not depressed appearing.  Admission Labs Na: 141, K: 3.7, Cl: 106, CO2: 26, BUN: 15, Cr: 0.7, Glucose: 200 WBC: 6.4, Hb: 11.7, HCT: 35.2, Plt: 136 Troponins: 0.30 -> 0.38 -> 0.30  Hospital Course by problem list: 1. NSTEMI - The patient presented with chest pain, with troponin elevation to 0.38, though no new EKG changes.  The patient had a history of 30% stenosis of ramus intermedius and  circumflex on cath in feb 2007.  The patient was started on a heparin drip, and taken for a cardiac cath on 2/15, which showed no critical vessel disease, but did show significantly elevated LV EDP.  The patient's lasix dose was increased, and spironolactone was added, which significantly improved the patient's blood pressures.  The patient noted no chest pain on discharge.  For risk factor modification, the patient's atorvastatin was changed to rosuvastatin, lantus was started for diabetes, and the patient was counseled on smoking cessation.  2. Hypertension - The patient presented with hypertension, with elevated LV EDP on cath.  The patient's lasix was increased from 20 mg daily to 40 mg qam and 20 mg qpm.  Spironolactone was also added to the patient's BP regimen.  The patient's hypertension will be further managed in the outpatient setting.  3. Diabetes - The patient has a history of diabetes, previously managed on metformin.  The patient was found to have a Hb A1C = 9.9, with elevated CBG's during hospitalization.  The patient was started on Lantus during admission, and the dose was uptitrated to 18 units based on CBG's.  The patient was discharged on Lantus, and will follow-up in the outpatient setting.  4. Hyperlipidemia - The patient has a history of hyperlipidemia, managed with atorvastatin.  The patient's LDL on admission was found to be 120 on admission, with goal LDL < 100.  The patient's statin was changed to rosuvastatin to optimize lipid control.  5. History of colon cancer - The patient has a history of colon cancer, with a port-a-cath in place.  The patient was scheduled for port-a-cath removal 1 week after admission, but given the patient's current admission, the port-a-cath was removed via IR during this admission.  The procedure had no complications  Time spent on discharge: 45 minutes  Discharge Vitals:  BP 122/78  Pulse 66  Temp(Src) 98.9 F (37.2 C) (Oral)  Resp 18  Ht 5\' 6"   (1.676 m)  Wt 260 lb 9.3 oz (118.2 kg)  BMI 42.06 kg/m2  SpO2 92%  Discharge Labs:  BMET    Component Value Date/Time   NA 134* 01/07/2012 0515   NA 137 05/15/2011 1542   K 4.3 01/07/2012 0515   K 4.9* 05/15/2011 1542   CL 97 01/07/2012 0515   CL 99 05/15/2011 1542   CO2 28 01/07/2012 0515   CO2 25 05/15/2011 1542   GLUCOSE 242* 01/07/2012 0515   GLUCOSE 249* 05/15/2011 1542   BUN 22 01/07/2012 0515   BUN 16 05/15/2011 1542   CREATININE 0.99 01/07/2012 0515   CREATININE 0.7 05/15/2011 1542   CALCIUM 9.6 01/07/2012 0515   CALCIUM 9.2 05/15/2011 1542   GFRNONAA 63* 01/07/2012 0515   GFRAA 73* 01/07/2012 0515    CBC    Component Value Date/Time   WBC 5.4 01/08/2012 0530   WBC 6.5 12/27/2011 1016   WBC 5.4 10/25/2008 1443   RBC 3.90 01/08/2012 0530   RBC 4.40 12/27/2011 1016   HGB 11.3* 01/08/2012 0530   HGB 12.8 12/27/2011 1016   HGB 11.2* 10/25/2008 1443   HCT 34.4* 01/08/2012 0530   HCT 38.2 12/27/2011 1016   HCT 33.5* 10/25/2008 1443   PLT 141* 01/08/2012 0530   PLT 179 12/27/2011 1016   PLT 181 10/25/2008 1443   MCV 88.2 01/08/2012 0530   MCV 87.0 12/27/2011 1016   MCV 79* 10/25/2008 1443   MCH 29.0 01/08/2012 0530   MCH 29.2 12/27/2011 1016   MCH 26.5 10/25/2008 1443   MCHC 32.8 01/08/2012 0530   MCHC 33.5 12/27/2011 1016   MCHC 33.5 10/25/2008 1443   RDW 13.5 01/08/2012 0530   RDW 14.2 12/27/2011 1016   RDW 14.8* 10/25/2008 1443   LYMPHSABS 2.3 01/03/2012 0445   LYMPHSABS 1.6 12/27/2011 1016   LYMPHSABS 1.1 10/25/2008 1443   MONOABS 0.4 01/03/2012 0445   MONOABS 0.4 12/27/2011 1016   EOSABS 0.1 01/03/2012 0445   EOSABS 0.0 12/27/2011 1016   EOSABS 0.1 10/25/2008 1443   BASOSABS 0.0 01/03/2012 0445   BASOSABS 0.0 12/27/2011 1016   BASOSABS 0.0 10/25/2008 1443    Signed: [name] 01/12/2012, 11:49 AM

## 2012-01-16 NOTE — Discharge Summary (Signed)
Internal Medicine Teaching Service Attending Note Date: 01/16/2012  Patient name: Debra Graham  Medical record number: [id number]  Date of birth: 1955/12/26    This patient has been seen and discussed with the house staff. Please see their note for complete details. I concur with their findings and discharge plan as outlined by Dr. Manson Passey.  Khamryn Calderone

## 2012-01-18 ENCOUNTER — Ambulatory Visit (INDEPENDENT_AMBULATORY_CARE_PROVIDER_SITE_OTHER): Payer: Medicare Other | Admitting: Physician Assistant

## 2012-01-18 ENCOUNTER — Encounter: Payer: Self-pay | Admitting: Physician Assistant

## 2012-01-18 ENCOUNTER — Ambulatory Visit (INDEPENDENT_AMBULATORY_CARE_PROVIDER_SITE_OTHER): Payer: Medicare Other | Admitting: Internal Medicine

## 2012-01-18 VITALS — BP 129/70 | HR 66 | Ht 66.0 in | Wt 259.0 lb

## 2012-01-18 DIAGNOSIS — I10 Essential (primary) hypertension: Secondary | ICD-10-CM

## 2012-01-18 DIAGNOSIS — E119 Type 2 diabetes mellitus without complications: Secondary | ICD-10-CM

## 2012-01-18 DIAGNOSIS — R079 Chest pain, unspecified: Secondary | ICD-10-CM

## 2012-01-18 DIAGNOSIS — F329 Major depressive disorder, single episode, unspecified: Secondary | ICD-10-CM

## 2012-01-18 DIAGNOSIS — I428 Other cardiomyopathies: Secondary | ICD-10-CM | POA: Diagnosis not present

## 2012-01-18 DIAGNOSIS — C189 Malignant neoplasm of colon, unspecified: Secondary | ICD-10-CM | POA: Diagnosis not present

## 2012-01-18 DIAGNOSIS — E785 Hyperlipidemia, unspecified: Secondary | ICD-10-CM

## 2012-01-18 DIAGNOSIS — I5022 Chronic systolic (congestive) heart failure: Secondary | ICD-10-CM

## 2012-01-18 DIAGNOSIS — F3289 Other specified depressive episodes: Secondary | ICD-10-CM

## 2012-01-18 LAB — BASIC METABOLIC PANEL
CO2: 29 mEq/L (ref 19–32)
Chloride: 104 mEq/L (ref 96–112)
Creatinine, Ser: 0.9 mg/dL (ref 0.4–1.2)
Potassium: 4 mEq/L (ref 3.5–5.1)
Sodium: 139 mEq/L (ref 135–145)

## 2012-01-18 LAB — GLUCOSE, CAPILLARY: Glucose-Capillary: 166 mg/dL — ABNORMAL HIGH (ref 70–99)

## 2012-01-18 MED ORDER — QUETIAPINE FUMARATE 50 MG PO TABS: 50.0000 mg | ORAL_TABLET | Freq: Every day | ORAL | Status: DC

## 2012-01-18 NOTE — Patient Instructions (Signed)
It was nice to see you [name].  As discussed, please start taking the spironolactone as soon as you can get it. Take your other medications as prescribed.  I have also refilled your Seroquel.  Return to see me after your next appointment with Cardiology.

## 2012-01-18 NOTE — Patient Instructions (Signed)
Your physician recommends that you schedule a follow-up appointment in: 6-8 WEEKS WITH DR. [facility]  Your physician recommends that you return for lab work in: TODAY BMET [phone], 401.1

## 2012-01-18 NOTE — Progress Notes (Signed)
[address] [place], [place]  [address] Phone: [phone] Fax:  [phone]  Date:  01/18/2012   Name:  Debra Graham       DOB:  Aug 11, 1956 MRN:  [id number]  PCP:  Dr. Bosie Clos Primary Cardiologist:  Dr. [name]  Primary Electrophysiologist:  None    History of Present Illness: Debra Graham is a 56 y.o. female who presents for post hospital follow up.  She has a history of heart failure, nonischemic cardiomyopathy, nonobstructive coronary artery disease, prior history of cocaine abuse, diabetes, hyperlipidemia, hypertension and colon cancer.  She was admitted 2/13-2/19 with acute on chronic systolic heart failure in the setting of hypertensive urgency.  She had one troponin that was 0.38.  All other troponins were normal.  Suspect spurious result.  LHC 01/04/12: Luminal irregularities, no significant CAD, elevated LVEDP.  Echocardiogram 01/03/12: Mild LVH, EF 35-40%, moderate LAE.  She was diuresed with IV Lasix.  It was questioned if she had a hypertensive cardiomyopathy.  CHF medications were adjusted.  She is doing well.  She denies chest pain or significant changes in her dyspnea.  She has chronic dyspnea with exertion.  She describes class 2b-3 symptoms.  She is limited by knee pain.  She sleeps on 2 pillows chronically.  She denies PND or significant edema.  She does not weigh herself at home.  She denies syncope.  Past Medical History  Diagnosis Date  . Osteoarthritis   . Diabetes mellitus   . Hyperlipidemia   . History of cocaine abuse   . Hypertension   . Adenocarcinoma, colon 11/09    s/p subtotal colectomy with primary anastamosis by Dr. Johna Sheriff; Chemotherapy per Dr. Twanna Hy   . patient also endorses chronic intermittent abdominal aching and loose stools a/c with her colon cancer and Sx.   . Carpal tunnel syndrome   . Peripheral neuropathy   . Chronic systolic heart failure     LHC 01/04/12: Luminal irregularities, no significant CAD,  elevated LVEDP;  Echocardiogram 01/03/12: Mild LVH, EF 35-40%, moderate LAE.    [name] Depression   . NICM (nonischemic cardiomyopathy)     ? HTN cardiomyopathy    Current Outpatient Prescriptions  Medication Sig Dispense Refill  . amLODipine (NORVASC) 10 MG tablet Take 1 tablet (10 mg total) by mouth daily.  30 tablet  6  . carvedilol (COREG) 6.25 MG tablet Take 1 tablet (6.25 mg total) by mouth 2 (two) times daily with a meal.  60 tablet  5  . DULoxetine (CYMBALTA) 30 MG capsule Take 2 capsules (60 mg total) by mouth daily.  30 capsule  2  . furosemide (LASIX) 20 MG tablet Take 40 mg (2 tablets) daily in the morning, and 20 mg (1 tablet) daily in the evening.  90 tablet  3  . guaiFENesin (MUCINEX) 600 MG 12 hr tablet Take 1 tablet (600 mg total) by mouth 2 (two) times daily.  60 tablet  0  . insulin glargine (LANTUS) 100 UNIT/ML injection Inject 14 Units into the skin at bedtime.  15 mL  1  . Insulin Pen Needle (PEN NEEDLES) 31G X 6 MM MISC 1 Units by Does not apply route daily.  100 each  1  . lisinopril (PRINIVIL,ZESTRIL) 40 MG tablet Take 1 tablet (40 mg total) by mouth daily.  30 tablet  5  . metFORMIN (GLUCOPHAGE) 500 MG tablet Take 1 tablet (500 mg total) by mouth 2 (two) times daily with a meal.  60 tablet  11  . nitroGLYCERIN (NITROSTAT) 0.4 MG SL tablet Place 1 tablet (0.4 mg total) under the tongue every 5 (five) minutes. For chest pain. If more than 2 doses needed, call 911.  15 tablet  3  . QUEtiapine (SEROQUEL) 50 MG tablet Take 50 mg by mouth at bedtime.      . rosuvastatin (CRESTOR) 40 MG tablet Take 1 tablet (40 mg total) by mouth daily.  30 tablet  1  . spironolactone (ALDACTONE) 12.5 mg TABS Take 0.5 tablets (12.5 mg total) by mouth daily.  30 tablet  5  . traMADol (ULTRAM) 50 MG tablet Take 1 tablet (50 mg total) by mouth every 6 (six) hours as needed. For pain  90 tablet  1  . traZODone (DESYREL) 50 MG tablet Take 50 mg by mouth at bedtime as needed. As needed for sleep.         Allergies: No Known Allergies  History  Substance Use Topics  . Smoking status: Former Smoker -- 0.4 packs/day    Types: Cigarettes  . Smokeless tobacco: Not on file   Comment: Not ready as of yet.  Is decreasing amounts.  . Alcohol Use: No     ROS:  Please see the history of present illness.   She has a mild cough.  She denies fevers, chills, vomiting, diarrhea.  She's had some mild nausea.  All other systems reviewed and negative.   PHYSICAL EXAM: VS:  BP 129/70  Pulse 66  Ht 5\' 6"  (1.676 m)  Wt 259 lb (117.482 kg)  BMI 41.80 kg/m2 Well nourished, well developed, in no acute distress HEENT: normal Neck: no JVD Cardiac:  normal S1, S2; RRR; no murmur Lungs:  clear to auscultation bilaterally, no wheezing, rhonchi or rales Abd: soft, nontender, no hepatomegaly Ext: no edema; right wrist without hematoma or bruit Skin: warm and dry Neuro:  CNs 2-12 intact, no focal abnormalities noted  EKG:  Sinus rhythm, heart rate 66, left axis deviation, interventricular conduction delay, poor R-wave progression, nonspecific ST-T wave changes  ASSESSMENT AND PLAN:  1. Chronic systolic heart failure  Volume appears stable.  Continue beta blocker, ACE inhibitor, spironolactone and furosemide.  Check a basic metabolic panel today.  Followup with Dr. [name] in 6-8 weeks.   2. NICM (nonischemic cardiomyopathy)  She likely has a hypertensive cardiomyopathy.  Consider follow up echocardiogram in 6 months to reassess LV function.   3. HYPERTENSION  Controlled.   4. HYPERLIPIDEMIA  Managed by PCP.         Luna Glasgow, PA-C  12:38 PM 01/18/2012

## 2012-01-21 ENCOUNTER — Encounter: Payer: Self-pay | Admitting: *Deleted

## 2012-01-21 ENCOUNTER — Other Ambulatory Visit: Payer: Self-pay | Admitting: *Deleted

## 2012-01-21 ENCOUNTER — Other Ambulatory Visit: Payer: Self-pay | Admitting: Internal Medicine

## 2012-01-21 DIAGNOSIS — E119 Type 2 diabetes mellitus without complications: Secondary | ICD-10-CM

## 2012-01-21 MED ORDER — METFORMIN HCL 500 MG PO TABS: 500.0000 mg | ORAL_TABLET | Freq: Two times a day (BID) | ORAL | Status: DC

## 2012-01-21 NOTE — Assessment & Plan Note (Signed)
Followed by Cardiology Dr. [name] and PA Tereso Newcomer with f/u March 03, 2012 with  [facility] for 2D ECHO to reassess LV function.  Currently on Lasix 40 mg in am and 20 mg in pm without complaints of sob, edema or chest pain.

## 2012-01-21 NOTE — Assessment & Plan Note (Signed)
Pressure elevated today at 167/85 with pulse 75 bpm.  Seen by Cardiologist today.  Reports that she has not filled prescription for spironolactone yet but is taking lisinopril 40 mg daily and carvedilol 6.25 mg bid.  She has a BMET pending from [facility] today.  Will not change regimen today but would like to reassess her once she is taking the spironolactone.

## 2012-01-21 NOTE — Assessment & Plan Note (Signed)
Discharged on rosuvastatin (Crestor) but patient reports that she has not filled it and has been finishing her current supply of the atorvastatin.

## 2012-01-21 NOTE — Progress Notes (Signed)
Subjective:     Patient ID: Debra Graham, female   DOB: 1956/05/31, 56 y.o.   MRN: [id number]  HPI  Reports today for follow-up from hospital after being admitted from clinic via ER with chest pain and subsequent slight troponin elevation of 0.38.  She had cardiac catheterization (01/04/2012 w/o significant coronary disease, very elevated LV EDDP)  and 2D ECHO (01/03/2012 with EF 35-45%, mild LVH, moderately reduced systolic function, mildly dilated left atrium) along with lasix diuresis. She has been seen by [facility] today and reports no chest pain or shortness of breath.  She has not complied with spironolactone therapy as of yet but reports adherence to beta-blocker, ACE-I and lasix therapy, in addition to newly started Lantus and continuing metformin.  Review of Systems  Constitutional: Negative for fever and fatigue.  HENT: Negative for congestion and sneezing.   Eyes: Negative for visual disturbance.  Respiratory: Negative for shortness of breath and wheezing.   Cardiovascular: Negative for chest pain.  Gastrointestinal: Negative for abdominal distention.  Genitourinary: Negative for dysuria.  Neurological: Negative for dizziness and weakness.  Psychiatric/Behavioral: Negative for dysphoric mood.       Objective:   Physical Exam  Constitutional: She is oriented to person, place, and time. She appears well-developed.       Morbidly obese  HENT:  Head: Normocephalic and atraumatic.  Eyes: EOM are normal. Pupils are equal, round, and reactive to light.  Neck: Normal range of motion. Neck supple.  Cardiovascular: Normal rate, regular rhythm and intact distal pulses.   Murmur heard. Pulmonary/Chest: Effort normal and breath sounds normal. She has no wheezes.  Abdominal: Soft. Bowel sounds are normal. She exhibits no distension.  Musculoskeletal: Normal range of motion. She exhibits edema.       Trace LE edema bilaterally  Neurological: She is alert and oriented to person,  place, and time.  Skin: Skin is warm and dry.  Psychiatric: She has a normal mood and affect. Judgment and thought content normal.       Assessment:     1. Systolic congestive heart failure 2. nonischemic cardiomyopathy 3. Diabetes  Mellitus 4. hypertensiion 5. Depression 6. Hyperlipidemia 7. Adenocarcinoma of colon    Plan:     See problem list

## 2012-01-21 NOTE — Assessment & Plan Note (Signed)
2D ECHO likely in April per [facility] to reassess LV function. Currently without complaints of chest pain.

## 2012-01-21 NOTE — Assessment & Plan Note (Signed)
Stable on Seroquel and Cymbalta with trazadone for sleep

## 2012-01-21 NOTE — Assessment & Plan Note (Signed)
Recently had port removed.  Concerned about steri strips that covered incision.  Patient informed to not pull them off but rather let them fall of in the shower to prevent opening incision.

## 2012-01-21 NOTE — Assessment & Plan Note (Signed)
Rcenet poor control with HgbA1c ~10.  This was due to patient "running out of medicines" for well over a month or so.  Will continue with metformin 500 mg bid.  She was discharged  From hospital on lantus 14 units qhs.  Will continue with this regimen and reassess in ~3 months.

## 2012-01-22 NOTE — Telephone Encounter (Signed)
Rx phoned into pharmacy.

## 2012-02-08 ENCOUNTER — Telehealth: Payer: Self-pay | Admitting: Dietician

## 2012-02-08 DIAGNOSIS — E119 Type 2 diabetes mellitus without complications: Secondary | ICD-10-CM

## 2012-02-13 NOTE — Telephone Encounter (Signed)
Per patient: she has not gone to eye doctor for at least 2 years, says diabetes is affecting eyes, but they suggested she come after getting her diabetes better controlled. Agrees to see CDE, RD same day as next doctor appointment. Request referral from PCP

## 2012-03-03 ENCOUNTER — Ambulatory Visit: Payer: Medicare Other | Admitting: Cardiology

## 2012-03-05 IMAGING — CT CT CHEST W/ CM
2 of 5 series · 17 of 46 positions shown, 19 images · IV contrast (agent unspecified)
Comparison: 11/01/2009

CT CHEST

CLINICAL DATA: Follow-up metastatic colon carcinoma.  Abdominal
pain and bloating.  Diarrhea.

CT CHEST, ABDOMEN AND PELVIS WITH CONTRAST
TECHNIQUE: Multidetector CT imaging of the chest, abdomen and
pelvis was performed following the standard protocol during bolus
administration of intravenous contrast.
Contrast: 100 ml Qmnipaque-ZWW and oral contrast

[Series 2: cap with st · axial · 0.76mm/px · z∈[-560,-60]mm · 14 of 116 slices shown, 16 images]
[im 8/116  soft-tissue]
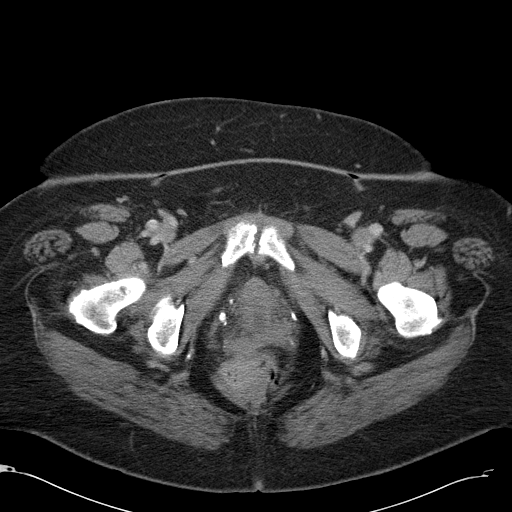
[im 8/116  bone]
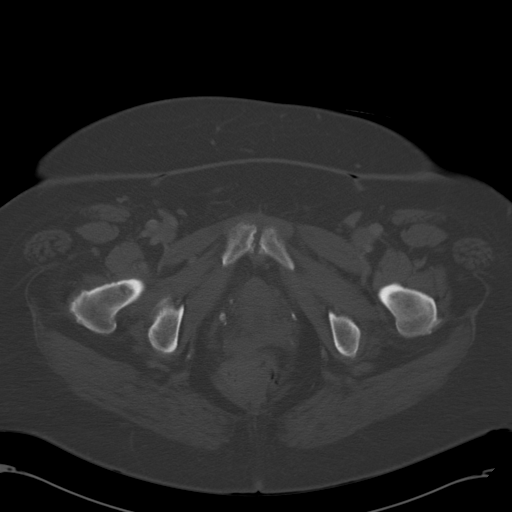
[im 15/116  soft-tissue]
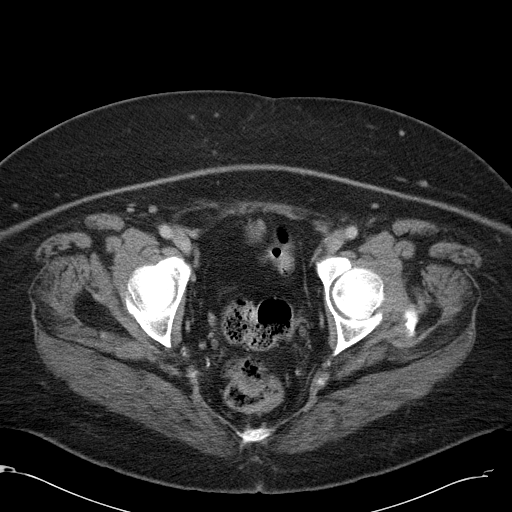
[im 22/116  soft-tissue]
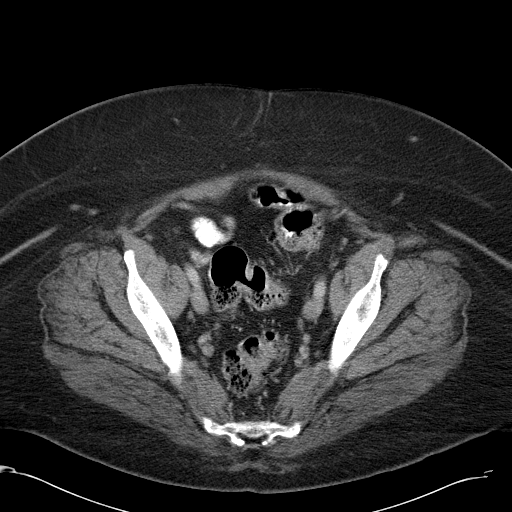
[im 29/116  soft-tissue]
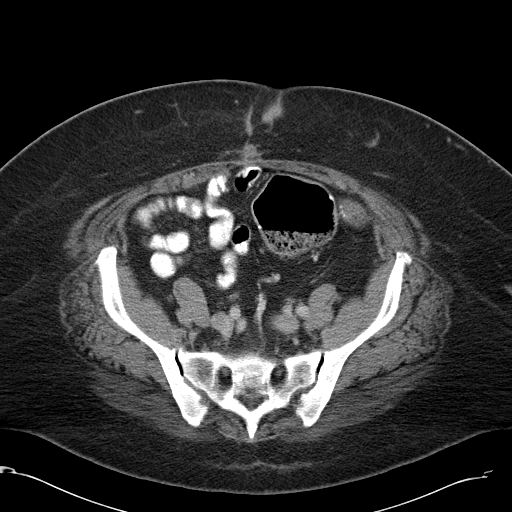
[im 36/116  soft-tissue]
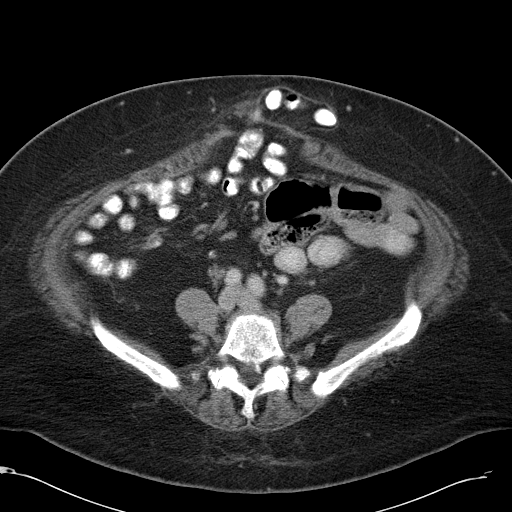
[im 44/116  soft-tissue]
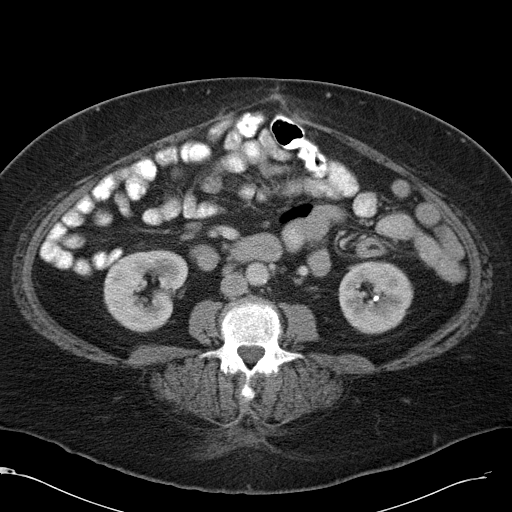
[im 51/116  soft-tissue]
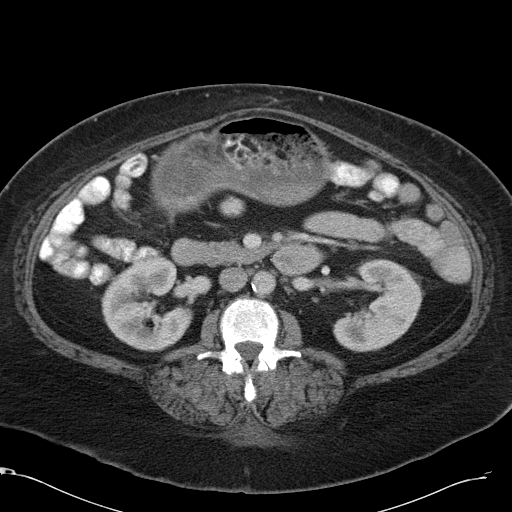
[im 65/116  soft-tissue]
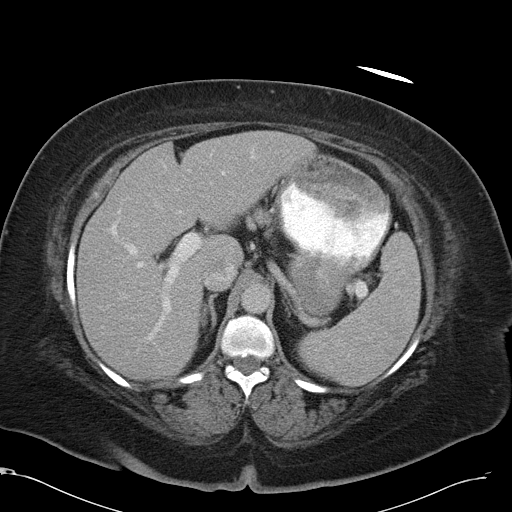
[im 72/116  soft-tissue]
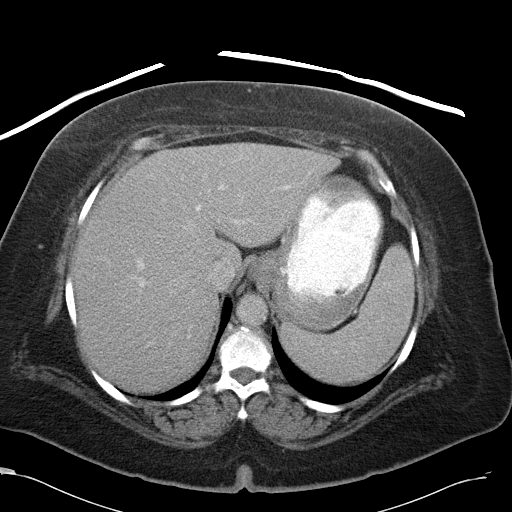
[im 72/116  bone]
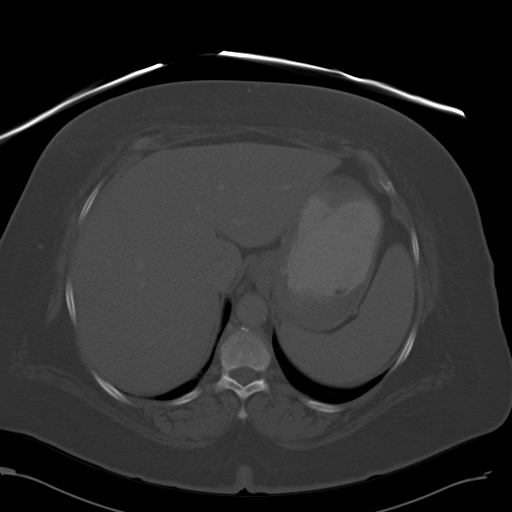
[im 80/116  soft-tissue]
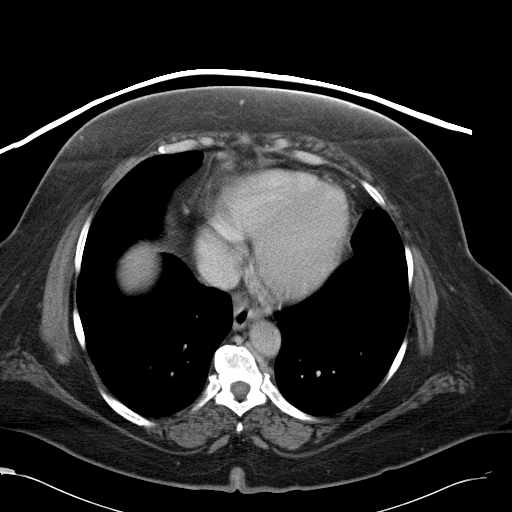
[im 87/116  soft-tissue]
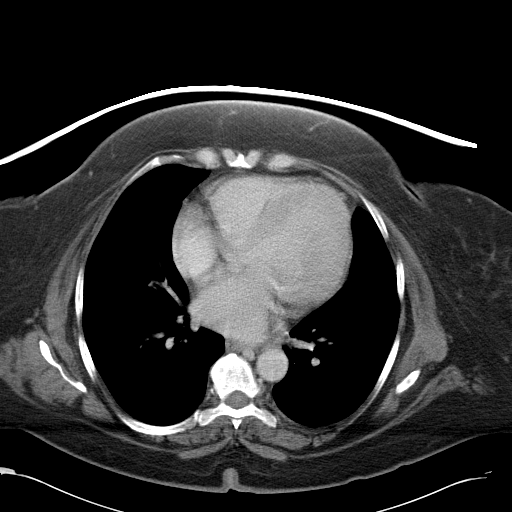
[im 94/116  soft-tissue]
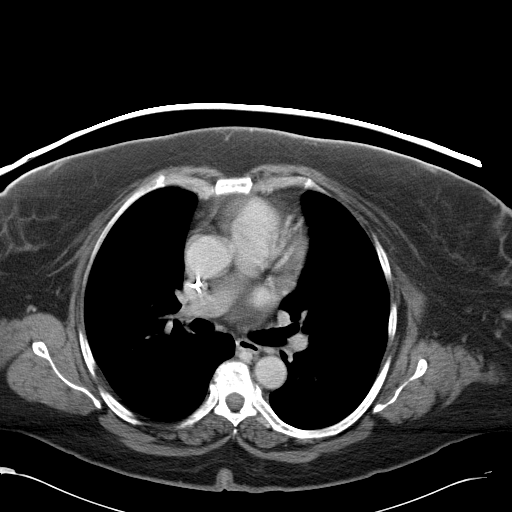
[im 101/116  soft-tissue]
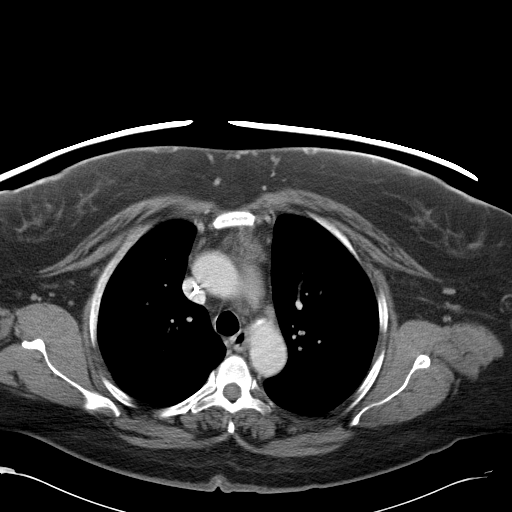
[im 108/116  soft-tissue]
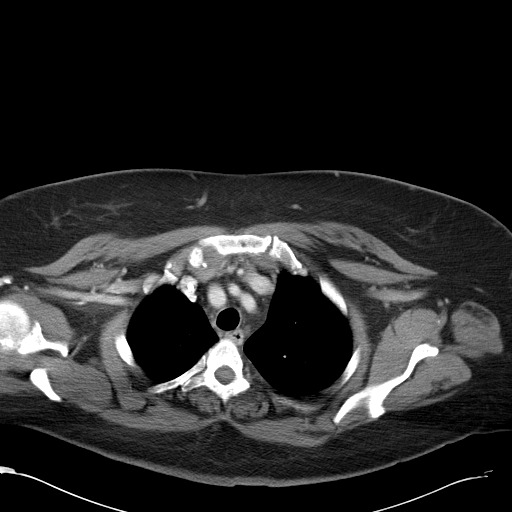

[Series 602: <mpr thick range> · coronal · 1.13mm/px · 3 of 100 slices shown]
[im 34/100  soft-tissue]
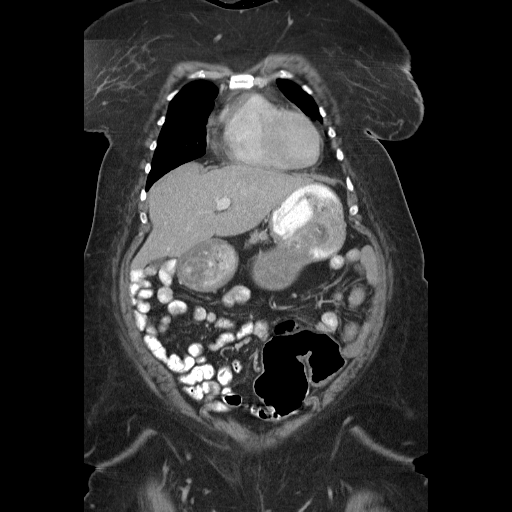
[im 45/100  soft-tissue]
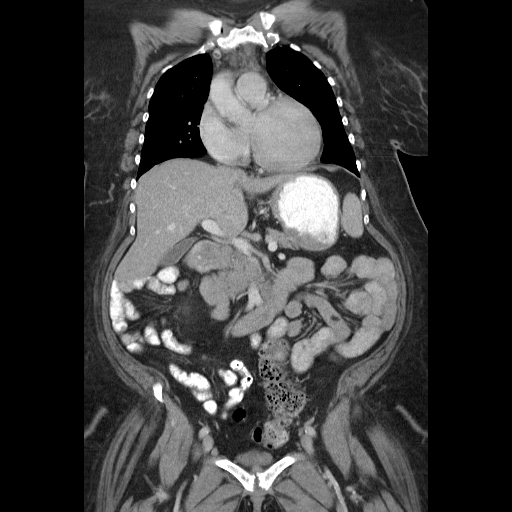
[im 56/100  soft-tissue]
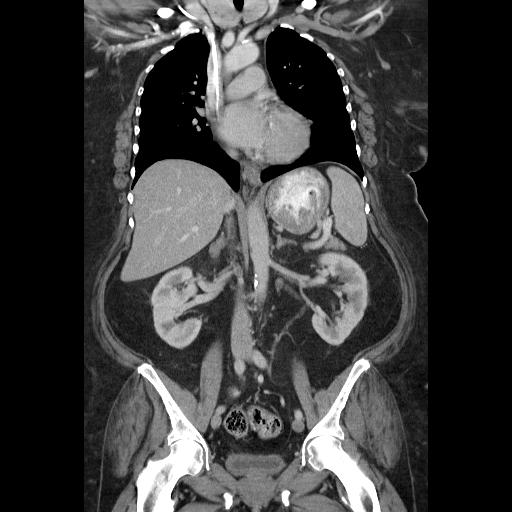

[17 of 46 positions shown; findings below may reference images not displayed]

FINDINGS: Residual thymic tissue in the anterior mediastinum is
stable.  Mild substernal goiter is also unchanged.  No other
mediastinal or hilar masses are identified.  There is no evidence
of lymphadenopathy within the thorax.

There is no evidence of pleural or pericardial effusion.  Both
lungs are clear and there is no evidence of pulmonary infiltrate or
mass.  No suspicious bone lesions are identified.
IMPRESSION: Stable chest CT.  No evidence of metastatic disease or other acute
findings.

CT ABDOMEN AND PELVIS
FINDINGS: The liver, pancreas, and adrenal glands are normal in
appearance.  A tiny right renal cyst and small less than 5 mm left
lower pole intrarenal calculus remains stable.  No evidence of
renal mass or hydronephrosis.  A tiny less than 1 cm low
attenuation lesion in the anterior aspect of the spleen is also
stable.

No soft tissue masses or lymphadenopathy identified elsewhere
within the abdomen or pelvis.  There is no evidence of inflammatory
process or ascites.  No evidence of dilated bowel loops.  The
patient has undergone previous right hemicolectomy.  A
periumbilical abdominal wall hernia is again seen containing small
bowel loops, however there is no evidence of bowel obstruction or
ischemia.  No suspicious bone lesions are identified.
IMPRESSION: 1.  Stable abdomen and pelvis CT.  No evidence of recurrent or
metastatic disease.
2.  Stable periumbilical abdominal wall hernia and left
nephrolithiasis.

## 2012-03-12 ENCOUNTER — Encounter: Payer: Medicare Other | Admitting: Internal Medicine

## 2012-03-12 ENCOUNTER — Encounter: Payer: Medicare Other | Admitting: Dietician

## 2012-04-09 ENCOUNTER — Other Ambulatory Visit: Payer: Self-pay | Admitting: Internal Medicine

## 2012-04-17 ENCOUNTER — Telehealth: Payer: Self-pay | Admitting: Cardiology

## 2012-04-17 NOTE — Telephone Encounter (Signed)
New Problem:   I called the patient to reschedule their appointment with Dr. Mclean on 04/29/12 because he will be out of the office, and was only able to leave a message for her to call back to reschedule. 

## 2012-04-21 ENCOUNTER — Other Ambulatory Visit (HOSPITAL_COMMUNITY): Payer: Medicare Other

## 2012-04-21 ENCOUNTER — Other Ambulatory Visit (HOSPITAL_BASED_OUTPATIENT_CLINIC_OR_DEPARTMENT_OTHER): Payer: Medicare Other | Admitting: Lab

## 2012-04-24 ENCOUNTER — Telehealth: Payer: Self-pay | Admitting: *Deleted

## 2012-04-24 ENCOUNTER — Other Ambulatory Visit (HOSPITAL_COMMUNITY): Payer: Medicare Other

## 2012-04-24 ENCOUNTER — Other Ambulatory Visit: Payer: Medicare Other

## 2012-04-24 NOTE — Telephone Encounter (Signed)
Pt did not show for her CT appt today,   Attempted to call pt and no answer.  Called pt's son, Molly Maduro, and asked him to have pt call us to r/s the CT appt and her appt w/ Dr. Gaylyn Rong.  Explained pt should have her CT scan prior to next office visit, so please r/s both appts..  He agreed to give message to his mother.

## 2012-04-25 ENCOUNTER — Ambulatory Visit: Payer: Medicare Other | Admitting: Oncology

## 2012-04-29 ENCOUNTER — Ambulatory Visit: Payer: Medicare Other | Admitting: Physician Assistant

## 2012-04-29 ENCOUNTER — Ambulatory Visit: Payer: Medicare Other | Admitting: Cardiology

## 2012-05-02 ENCOUNTER — Telehealth: Payer: Self-pay | Admitting: Oncology

## 2012-05-02 NOTE — Telephone Encounter (Signed)
pt called and r/s appt lab for 06/18 and f/u on 07/05

## 2012-05-06 ENCOUNTER — Other Ambulatory Visit: Payer: Medicare Other | Admitting: Lab

## 2012-05-06 ENCOUNTER — Ambulatory Visit (HOSPITAL_COMMUNITY)
Admission: RE | Admit: 2012-05-06 | Discharge: 2012-05-06 | Disposition: A | Discharge status: Home / Self Care | Payer: Medicare Other | Source: Ambulatory Visit | Attending: Oncology | Admitting: Oncology

## 2012-05-06 ENCOUNTER — Encounter (HOSPITAL_COMMUNITY): Payer: Self-pay

## 2012-05-06 DIAGNOSIS — D739 Disease of spleen, unspecified: Secondary | ICD-10-CM | POA: Diagnosis not present

## 2012-05-06 DIAGNOSIS — C189 Malignant neoplasm of colon, unspecified: Secondary | ICD-10-CM | POA: Diagnosis not present

## 2012-05-06 LAB — CEA: CEA: <0.5 ng/mL (ref 0.0–5.0)

## 2012-05-06 LAB — CMP (CANCER CENTER ONLY)
ALT(SGPT): 18 U/L (ref 10–47)
AST: 21 U/L (ref 11–38)
Albumin: 3.5 g/dL (ref 3.3–5.5)
BUN, Bld: 16 mg/dL (ref 7–22)
Calcium: 9.3 mg/dL (ref 8.0–10.3)
Chloride: 102 mEq/L (ref 98–108)
Potassium: 4.8 mEq/L — ABNORMAL HIGH (ref 3.3–4.7)
Sodium: 142 mEq/L (ref 128–145)
Total Protein: 7.6 g/dL (ref 6.4–8.1)

## 2012-05-06 LAB — CBC WITH DIFFERENTIAL/PLATELET
BASO%: 0.1 % (ref 0.0–2.0)
Eosinophils Absolute: 0 10*3/uL (ref 0.0–0.5)
LYMPH%: 23.6 % (ref 14.0–49.7)
MONO#: 0.4 10*3/uL (ref 0.1–0.9)
NEUT#: 5.4 10*3/uL (ref 1.5–6.5)
Platelets: 168 10*3/uL (ref 145–400)
RBC: 4.48 10*6/uL (ref 3.70–5.45)
RDW: 14.4 % (ref 11.2–14.5)
WBC: 7.7 10*3/uL (ref 3.9–10.3)
lymph#: 1.8 10*3/uL (ref 0.9–3.3)

## 2012-05-06 MED ORDER — IOHEXOL 300 MG/ML  SOLN
100.0000 mL | Freq: Once | INTRAMUSCULAR | Status: AC | PRN
  Administered 2012-05-06: 100 mL via INTRAVENOUS

## 2012-05-07 ENCOUNTER — Ambulatory Visit (INDEPENDENT_AMBULATORY_CARE_PROVIDER_SITE_OTHER): Payer: Medicare Other | Admitting: Physician Assistant

## 2012-05-07 ENCOUNTER — Encounter: Payer: Self-pay | Admitting: Physician Assistant

## 2012-05-07 VITALS — BP 119/64 | HR 64 | Ht 66.5 in | Wt 244.0 lb

## 2012-05-07 DIAGNOSIS — I428 Other cardiomyopathies: Secondary | ICD-10-CM

## 2012-05-07 DIAGNOSIS — I10 Essential (primary) hypertension: Secondary | ICD-10-CM

## 2012-05-07 DIAGNOSIS — R0602 Shortness of breath: Secondary | ICD-10-CM | POA: Diagnosis not present

## 2012-05-07 DIAGNOSIS — R079 Chest pain, unspecified: Secondary | ICD-10-CM

## 2012-05-07 DIAGNOSIS — I509 Heart failure, unspecified: Secondary | ICD-10-CM

## 2012-05-07 DIAGNOSIS — I5022 Chronic systolic (congestive) heart failure: Secondary | ICD-10-CM | POA: Diagnosis not present

## 2012-05-07 MED ORDER — CARVEDILOL 6.25 MG PO TABS: ORAL_TABLET | ORAL | Status: DC

## 2012-05-07 MED ORDER — FUROSEMIDE 40 MG PO TABS: ORAL_TABLET | ORAL | Status: DC

## 2012-05-07 NOTE — Progress Notes (Signed)
[address] [place], [place]  [address] Phone: [phone] Fax:  [phone]  Date:  05/07/2012   Name:  Debra Graham    DOB:  [birth year]   MRN:  [id number]  PCP:  Dr. Bosie Clos Primary Cardiologist:  Dr. [name]  Primary Electrophysiologist:  None    History of Present Illness: Debra Graham is a 56 y.o. female who presents for follow up.  She has a history of systolic CHF, nonischemic cardiomyopathy, nonobstructive coronary artery disease, prior history of cocaine abuse, DM2, HTN, HL and colon cancer.  She was admitted 12/2011 with a/c systolic heart failure in the setting of hypertensive urgency.  She had one abnormal troponin; rest were normal.  LHC 01/04/12: Luminal irregularities, no significant CAD, elevated LVEDP.  Echocardiogram 01/03/12: Mild LVH, EF 35-40%, moderate LAE.    I saw her in follow up in March.  She is doing ok.  Has a lot of social stressors.  Son has been in and out of jail.  She was responsible for 2 homes after mom died and now is getting a huge tax bill to pay.  States she thinks her PCP took her off of crestor and may be taking something different.  Not sure.  Has had to take several NTG for CP.  It is exertional.  It is substernal.  It is tight and heavy.  No radiation, assoc nausea or diaphoresis.  She does feel short of breath with it.  Has some pleuritic CP.  No syncope. She sleeps on 1-2 pillows.  No PND.  Notes some pedal edema.  Taking all other meds.  Trying to watch salt.  Unfortunately, still smoking.  Denies ETOH or drugs.    Wt Readings from Last 3 Encounters:  05/07/12 244 lb (110.678 kg)  01/18/12 261 lb 6.4 oz (118.57 kg)  01/18/12 259 lb (117.482 kg)    Potassium  Date/Time Value Range Status  05/06/2012 11:38 AM 4.8* 3.3 - 4.7 mEq/L Final          Creat  Date/Time Value Range Status  05/06/2012 11:38 AM 1.0  0.6 - 1.2 mg/dl Final    Past Medical History  Diagnosis Date  . Osteoarthritis   . Diabetes  mellitus   . Hyperlipidemia   . History of cocaine abuse   . Hypertension   . Adenocarcinoma, colon 11/09    s/p subtotal colectomy with primary anastamosis by Dr. Johna Sheriff; Chemotherapy per Dr. Twanna Hy   . patient also endorses chronic intermittent abdominal aching and loose stools a/c with her colon cancer and Sx.   . Carpal tunnel syndrome   . Peripheral neuropathy   . Chronic systolic heart failure     LHC 01/04/12: Luminal irregularities, no significant CAD, elevated LVEDP;  Echocardiogram 01/03/12: Mild LVH, EF 35-40%, moderate LAE.    [name] Depression   . NICM (nonischemic cardiomyopathy)     ? HTN cardiomyopathy    Current Outpatient Prescriptions  Medication Sig Dispense Refill  . amLODipine (NORVASC) 10 MG tablet Take 1 tablet (10 mg total) by mouth daily.  30 tablet  6  . carvedilol (COREG) 6.25 MG tablet TAKE 1 AND 1/2 TABLETS TWICE DAILY  180 tablet  6  . CYMBALTA 60 MG capsule TAKE ONE CAPSULE BY MOUTH EVERY DAY  30 capsule  2  . furosemide (LASIX) 40 MG tablet TAKE 1 TABLET 40 MG TWICE DAILY  60 tablet  6  . lisinopril (PRINIVIL,ZESTRIL) 40 MG tablet Take 1 tablet (40 mg  total) by mouth daily.  30 tablet  5  . metFORMIN (GLUCOPHAGE) 500 MG tablet Take 1 tablet (500 mg total) by mouth 2 (two) times daily with a meal.  60 tablet  11  . nitroGLYCERIN (NITROSTAT) 0.4 MG SL tablet Place 1 tablet (0.4 mg total) under the tongue every 5 (five) minutes. For chest pain. If more than 2 doses needed, call 911.  15 tablet  3  . QUEtiapine (SEROQUEL) 50 MG tablet Take 1 tablet (50 mg total) by mouth at bedtime.  30 tablet  11  . spironolactone (ALDACTONE) 12.5 mg TABS Take 0.5 tablets (12.5 mg total) by mouth daily.  30 tablet  5  . traMADol (ULTRAM) 50 MG tablet Take 1 tablet (50 mg total) by mouth every 6 (six) hours as needed. For pain  90 tablet  1  . traZODone (DESYREL) 50 MG tablet Take 50 mg by mouth at bedtime as needed. As needed for sleep.      [name] DISCONTD: carvedilol (COREG) 6.25 MG  tablet Take 1 tablet (6.25 mg total) by mouth 2 (two) times daily with a meal.  60 tablet  5  . DISCONTD: furosemide (LASIX) 20 MG tablet Take 40 mg (2 tablets) daily in the morning, and 20 mg (1 tablet) daily in the evening.  90 tablet  3    Allergies: No Known Allergies  History  Substance Use Topics  . Smoking status: Current Some Day Smoker -- 0.4 packs/day    Types: Cigarettes  . Smokeless tobacco: Not on file   Comment: Not ready as of yet.  Is decreasing amounts.  . Alcohol Use: No     ROS:  Please see the history of present illness.   She has a mild cough.  She denies fevers, chills, vomiting, diarrhea.    All other systems reviewed and negative.   PHYSICAL EXAM: VS:  BP 119/64  Pulse 64  Ht 5' 6.5" (1.689 m)  Wt 244 lb (110.678 kg)  BMI 38.79 kg/m2 Well nourished, well developed, in no acute distress HEENT: normal Neck: JVP mildly elevated Cardiac:  normal S1, S2; RRR; no murmur Lungs:  clear to auscultation bilaterally, no wheezing, rhonchi or rales Abd: soft, nontender, no hepatomegaly Ext: very trace bilateral LE edema  Skin: warm and dry Neuro:  CNs 2-12 intact, no focal abnormalities noted  EKG:  Sinus rhythm, heart rate 62, left axis deviation, interventricular conduction delay, poor R-wave progression, nonspecific ST-T wave changes  ASSESSMENT AND PLAN:  1.  Chronic Systolic CHF Suspect she may have some extra volume that would explain some of her symptoms. Will try to push her Lasix to 40 mg BID. Check BNP today and repeat BMET and BNP in one week.  2.  Non-Ischemic Cardiomyopathy Adjust coreg to 9.375 mg bid. Continue ACE, aldactone. Increase Lasix as noted. Plan repeat echo in 06/2012 and follow up with Dr. [name] in 2 mos.  3.  Hypertension Controlled.  4.  Hyperlipidemia Managed by PCP.  5.  Tobacco Abuse She knows she needs to quit.  6.  Chest Pain ? Related to volume vs anxiety/stress. She had no CAD at cath. Not certain I  would attribute CP to small vessel disease. CP symptoms have been stable since admxn in 12/2011. Could consider nitrates if increased Lasix does not help.   Signed, Tereso Newcomer, PA-C  5:09 PM 05/07/2012

## 2012-05-07 NOTE — Patient Instructions (Addendum)
Your physician has requested that you have an echocardiogram DX 428.22 THIS IS TO BE DONE AFTER 07/01/12 Echocardiography is a painless test that uses sound waves to create images of your heart. It provides your doctor with information about the size and shape of your heart and how well your heart's chambers and valves are working. This procedure takes approximately one hour. There are no restrictions for this procedure.   PLEASE SEE DR. MCLEAN AFTER THE ECHO HAS BEEN DONE  Your physician recommends that you return for lab work in: TODAY BNP  Your physician recommends that you return for lab work in: 1 WEEK BMET, BNP  INCREASE LASIX TO 40 MG 1 TABLET TWICE DAILY  INCREASE COREG 6.25 MG TABLET; TAKE 1 AND 1/2 (HALF) TABLETS TWICE DAILY

## 2012-05-14 ENCOUNTER — Encounter: Payer: Self-pay | Admitting: Internal Medicine

## 2012-05-14 ENCOUNTER — Encounter: Payer: Medicare Other | Admitting: Dietician

## 2012-05-14 ENCOUNTER — Other Ambulatory Visit (INDEPENDENT_AMBULATORY_CARE_PROVIDER_SITE_OTHER): Payer: Medicare Other

## 2012-05-14 ENCOUNTER — Ambulatory Visit (INDEPENDENT_AMBULATORY_CARE_PROVIDER_SITE_OTHER): Payer: Medicare Other | Admitting: Internal Medicine

## 2012-05-14 VITALS — BP 142/74 | HR 71 | Temp 96.9°F | Ht 67.0 in | Wt 245.5 lb

## 2012-05-14 DIAGNOSIS — F329 Major depressive disorder, single episode, unspecified: Secondary | ICD-10-CM

## 2012-05-14 DIAGNOSIS — E119 Type 2 diabetes mellitus without complications: Secondary | ICD-10-CM | POA: Diagnosis not present

## 2012-05-14 DIAGNOSIS — F1910 Other psychoactive substance abuse, uncomplicated: Secondary | ICD-10-CM

## 2012-05-14 DIAGNOSIS — R0602 Shortness of breath: Secondary | ICD-10-CM | POA: Diagnosis not present

## 2012-05-14 DIAGNOSIS — I509 Heart failure, unspecified: Secondary | ICD-10-CM

## 2012-05-14 DIAGNOSIS — G609 Hereditary and idiopathic neuropathy, unspecified: Secondary | ICD-10-CM

## 2012-05-14 DIAGNOSIS — M25569 Pain in unspecified knee: Secondary | ICD-10-CM

## 2012-05-14 DIAGNOSIS — M129 Arthropathy, unspecified: Secondary | ICD-10-CM

## 2012-05-14 DIAGNOSIS — I10 Essential (primary) hypertension: Secondary | ICD-10-CM

## 2012-05-14 DIAGNOSIS — I428 Other cardiomyopathies: Secondary | ICD-10-CM

## 2012-05-14 DIAGNOSIS — I5022 Chronic systolic (congestive) heart failure: Secondary | ICD-10-CM

## 2012-05-14 LAB — BASIC METABOLIC PANEL
BUN: 18 mg/dL (ref 6–23)
Calcium: 9.6 mg/dL (ref 8.4–10.5)
Creatinine, Ser: 0.9 mg/dL (ref 0.4–1.2)
GFR: 80.15 mL/min (ref >60.00)
Glucose, Bld: 241 mg/dL — ABNORMAL HIGH (ref 70–99)
Sodium: 140 mEq/L (ref 135–145)

## 2012-05-14 LAB — POCT GLYCOSYLATED HEMOGLOBIN (HGB A1C): Hemoglobin A1C: 8.4

## 2012-05-14 NOTE — Patient Instructions (Signed)
Continue to take your medicines as prescribed to the best that you can given that you are low on funds.   Continue to follow-up with the Cardiologist as scheduled. I have filled out you Disability form. I will see you in 4 months.

## 2012-05-14 NOTE — Progress Notes (Signed)
Subjective:     Patient ID: Debra Graham, female   DOB: Jul 08, 1956, 56 y.o.   MRN: [id number]  HPI [name] presents today for forms to be filled out for disability status on her property taxes. She reports that her general health has been stable and she was recently seen by mobile cardiology with increase in her carvedilol and Lasix. Of note she reports that she has eventually compliant with his regimen as she is trying to "stretch her medications until the beginning of the month". Patient reports increased stress from yet another son being in incarcerated in [place] and property tax issues of properties that she does not reside in but remains under her name.  Review of Systems As per HPI    Objective:   Physical Exam  Constitutional: She is oriented to person, place, and time. She appears well-developed and well-nourished. No distress.  HENT:  Head: Normocephalic and atraumatic.  Eyes: Conjunctivae are normal. Pupils are equal, round, and reactive to light.  Neck: Normal range of motion. Neck supple.  Cardiovascular: Normal rate, regular rhythm, normal heart sounds and intact distal pulses.   No murmur heard. Pulmonary/Chest: Effort normal and breath sounds normal.  Abdominal: Soft. Bowel sounds are normal.  Neurological: She is alert and oriented to person, place, and time.  Skin: Skin is warm and dry.  Psychiatric: She has a normal mood and affect.       Assessment:     1. CHF: stable, no complaints of shortness of breath or chest pain today 2. Bipolar depression: Stable 3. Diabetes mellitus type 2: Poorly controlled hemoglobin A1c of 8.4 today    Plan:     Cont current regimen of carvedilol 9.75 mg twice a day, Lasix 40 mg twice a day, Spiriva lactone 12.5 mg daily, lisinopril 40 mg daily and amlodipine 10 mg daily  Followup with Dr. Shirlee Latch of about cardiology as scheduled  Continue Cymbalta 60 mg daily, Seroquel, and trazodone 50 mg each bedtime  No changes in  diabetes regimen today as patient reports poor adherence to the regimen do to stress the monetary factors, will reassess at her next followup.

## 2012-05-19 ENCOUNTER — Ambulatory Visit: Payer: Medicare Other | Admitting: Oncology

## 2012-05-19 ENCOUNTER — Other Ambulatory Visit: Payer: Medicare Other | Admitting: Lab

## 2012-05-19 ENCOUNTER — Other Ambulatory Visit: Payer: Self-pay | Admitting: Internal Medicine

## 2012-05-23 ENCOUNTER — Ambulatory Visit: Payer: Medicare Other | Admitting: Oncology

## 2012-05-23 ENCOUNTER — Telehealth: Payer: Self-pay | Admitting: Oncology

## 2012-05-23 NOTE — Telephone Encounter (Signed)
Received message from switchboard that pt called to cx 7/5 appt. Returned call and lmonvm for pt to call back to r/s appt.

## 2012-06-03 NOTE — Telephone Encounter (Signed)
Tramadol rx called to [facility] pharmacy on [address].

## 2012-07-01 ENCOUNTER — Encounter: Payer: Self-pay | Admitting: Internal Medicine

## 2012-07-03 ENCOUNTER — Other Ambulatory Visit (HOSPITAL_COMMUNITY): Payer: Medicare Other

## 2012-07-10 ENCOUNTER — Encounter: Payer: Medicare Other | Admitting: Dietician

## 2012-07-10 ENCOUNTER — Ambulatory Visit: Payer: Medicare Other | Admitting: Cardiology

## 2012-07-27 NOTE — Progress Notes (Signed)
No show. Rescheduled.

## 2012-07-30 ENCOUNTER — Ambulatory Visit: Payer: Medicare Other | Admitting: Cardiology

## 2012-07-30 ENCOUNTER — Other Ambulatory Visit (HOSPITAL_COMMUNITY): Payer: Medicare Other

## 2012-08-04 ENCOUNTER — Ambulatory Visit: Payer: Medicare Other | Admitting: Dietician

## 2012-08-04 ENCOUNTER — Encounter: Payer: Medicare Other | Admitting: Internal Medicine

## 2012-08-17 ENCOUNTER — Other Ambulatory Visit: Payer: Self-pay | Admitting: Internal Medicine

## 2012-09-22 ENCOUNTER — Encounter: Payer: Self-pay | Admitting: Internal Medicine

## 2012-09-22 ENCOUNTER — Ambulatory Visit (INDEPENDENT_AMBULATORY_CARE_PROVIDER_SITE_OTHER): Payer: Medicare Other | Admitting: Internal Medicine

## 2012-09-22 VITALS — BP 162/90 | HR 65 | Temp 98.9°F | Ht 67.0 in | Wt 249.3 lb

## 2012-09-22 DIAGNOSIS — I5022 Chronic systolic (congestive) heart failure: Secondary | ICD-10-CM

## 2012-09-22 DIAGNOSIS — I10 Essential (primary) hypertension: Secondary | ICD-10-CM

## 2012-09-22 DIAGNOSIS — E1165 Type 2 diabetes mellitus with hyperglycemia: Secondary | ICD-10-CM

## 2012-09-22 DIAGNOSIS — F3289 Other specified depressive episodes: Secondary | ICD-10-CM

## 2012-09-22 DIAGNOSIS — E119 Type 2 diabetes mellitus without complications: Secondary | ICD-10-CM

## 2012-09-22 DIAGNOSIS — F329 Major depressive disorder, single episode, unspecified: Secondary | ICD-10-CM

## 2012-09-22 MED ORDER — SPIRONOLACTONE 12.5 MG HALF TABLET: 12.5000 mg | ORAL_TABLET | Freq: Every day | ORAL | Status: DC

## 2012-09-22 MED ORDER — AMLODIPINE BESYLATE 10 MG PO TABS: 10.0000 mg | ORAL_TABLET | Freq: Every day | ORAL | Status: DC

## 2012-09-22 MED ORDER — QUETIAPINE FUMARATE 50 MG PO TABS: 50.0000 mg | ORAL_TABLET | Freq: Every day | ORAL | Status: DC

## 2012-09-22 MED ORDER — INSULIN GLARGINE 100 UNIT/ML ~~LOC~~ SOLN: 14.0000 [IU] | Freq: Every day | SUBCUTANEOUS | Status: DC

## 2012-09-22 MED ORDER — DULOXETINE HCL 60 MG PO CPEP: 60.0000 mg | ORAL_CAPSULE | Freq: Every day | ORAL | Status: DC

## 2012-09-22 MED ORDER — LISINOPRIL 40 MG PO TABS: 40.0000 mg | ORAL_TABLET | Freq: Every day | ORAL | Status: DC

## 2012-09-22 NOTE — Patient Instructions (Signed)
Fill your medications as soon as you are able and take your medications as prescribed including the Lantus for which we gave the pen and extra needles. Follow-up in 4 months.
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
## 2012-09-25 ENCOUNTER — Encounter: Payer: Self-pay | Admitting: Internal Medicine

## 2012-09-25 NOTE — Progress Notes (Signed)
Patient ID: Debra Graham, female   DOB: 1956-05-19, 56 y.o.   MRN: [id number]  This patient is a CHRONIC NO-SHOW PATIENT that has a history of HYPERTENSION.  Please make sure to address hypertension during her next clinic appointment, and intervene as appropriate.    Within the AVS, please incorporate the following smartphrase: .htntips   Pertinent Data: BP Readings from Last 3 Encounters:  09/22/12 162/90  05/14/12 142/74  05/07/12 119/64    BMI: Estimated Body mass index is 39.05 kg/(m^2) as calculated from the following:   Height as of 09/22/12: 5\' 7" (1.702 m).   Weight as of 09/22/12: 249 lb 4.8 oz(113.082 kg).  Smoking History: History  Smoking status  . Current Some Day Smoker -- 0.4 packs/day  . Types: Cigarettes  Smokeless tobacco  . Not on file    Comment: Not ready as of yet.  Is decreasing amounts..  Continues to cut back.    Last Basic Metabolic Panel:    Component Value Date/Time   Caffie Sotto 140 05/14/2012 1022   Pura Picinich 142 05/06/2012 1138   K 4.0 05/14/2012 1022   K 4.8* 05/06/2012 1138   CL 106 05/14/2012 1022   CL 102 05/06/2012 1138   CO2 24 05/14/2012 1022   CO2 29 05/06/2012 1138   BUN 18 05/14/2012 1022   BUN 16 05/06/2012 1138   CREATININE 0.9 05/14/2012 1022   CREATININE 1.0 05/06/2012 1138   GLUCOSE 241* 05/14/2012 1022   GLUCOSE 212* 05/06/2012 1138   CALCIUM 9.6 05/14/2012 1022   CALCIUM 9.3 05/06/2012 1138    Allergies: No Known Allergies

## 2012-10-19 ENCOUNTER — Other Ambulatory Visit: Payer: Self-pay | Admitting: Internal Medicine

## 2012-12-12 ENCOUNTER — Telehealth: Payer: Self-pay | Admitting: Oncology

## 2012-12-12 NOTE — Telephone Encounter (Signed)
returned pt call needed appt.Debra KitchenMarland KitchenMarland KitchenDone

## 2012-12-19 ENCOUNTER — Other Ambulatory Visit: Payer: Self-pay | Admitting: Internal Medicine

## 2012-12-23 ENCOUNTER — Other Ambulatory Visit: Payer: Self-pay | Admitting: Oncology

## 2012-12-23 DIAGNOSIS — C189 Malignant neoplasm of colon, unspecified: Secondary | ICD-10-CM

## 2012-12-24 ENCOUNTER — Telehealth: Payer: Self-pay | Admitting: Oncology

## 2012-12-24 ENCOUNTER — Ambulatory Visit (HOSPITAL_BASED_OUTPATIENT_CLINIC_OR_DEPARTMENT_OTHER): Payer: Medicare Other | Admitting: Oncology

## 2012-12-24 ENCOUNTER — Encounter: Payer: Self-pay | Admitting: Oncology

## 2012-12-24 ENCOUNTER — Other Ambulatory Visit (HOSPITAL_BASED_OUTPATIENT_CLINIC_OR_DEPARTMENT_OTHER): Payer: Medicare Other | Admitting: Lab

## 2012-12-24 VITALS — BP 135/73 | HR 61 | Temp 99.3°F | Resp 20 | Ht 67.0 in | Wt 237.1 lb

## 2012-12-24 DIAGNOSIS — Z85038 Personal history of other malignant neoplasm of large intestine: Secondary | ICD-10-CM | POA: Diagnosis not present

## 2012-12-24 DIAGNOSIS — C189 Malignant neoplasm of colon, unspecified: Secondary | ICD-10-CM

## 2012-12-24 DIAGNOSIS — Z1231 Encounter for screening mammogram for malignant neoplasm of breast: Secondary | ICD-10-CM

## 2012-12-24 DIAGNOSIS — E119 Type 2 diabetes mellitus without complications: Secondary | ICD-10-CM

## 2012-12-24 DIAGNOSIS — G609 Hereditary and idiopathic neuropathy, unspecified: Secondary | ICD-10-CM | POA: Diagnosis not present

## 2012-12-24 LAB — CBC WITH DIFFERENTIAL/PLATELET
Basophils Absolute: 0 10*3/uL (ref 0.0–0.1)
EOS%: 2.4 % (ref 0.0–7.0)
Eosinophils Absolute: 0.2 10*3/uL (ref 0.0–0.5)
HCT: 37.1 % (ref 34.8–46.6)
HGB: 12.8 g/dL (ref 11.6–15.9)
MCH: 29.1 pg (ref 25.1–34.0)
MCV: 84.5 fL (ref 79.5–101.0)
MONO%: 4.9 % (ref 0.0–14.0)
NEUT#: 5.6 10*3/uL (ref 1.5–6.5)
NEUT%: 69.4 % (ref 38.4–76.8)
Platelets: 174 10*3/uL (ref 145–400)

## 2012-12-24 LAB — COMPREHENSIVE METABOLIC PANEL (CC13)
Albumin: 3.3 g/dL — ABNORMAL LOW (ref 3.5–5.0)
Alkaline Phosphatase: 75 U/L (ref 40–150)
BUN: 16.4 mg/dL (ref 7.0–26.0)
CO2: 23 mEq/L (ref 22–29)
Glucose: 151 mg/dl — ABNORMAL HIGH (ref 70–99)
Potassium: 4.7 mEq/L (ref 3.5–5.1)
Total Bilirubin: 0.44 mg/dL (ref 0.20–1.20)
Total Protein: 7.2 g/dL (ref 6.4–8.3)

## 2012-12-24 NOTE — Progress Notes (Signed)
[facility] OFFICE PROGRESS NOTE  Cc:  Kristie Cowman, MD  DIAGNOSIS:   History of pT3, N0, M0 adenocarcinoma of the distal colon with near colonic obstruction at presentation.  PAST THERAPY:  Colectomy with ileosigmoidostomy on 09/17/2008 by Dr. Johna Sheriff with pathology showing 7.9 cm poorly differentiated adenocarcinoma with the tumor invading through the peri-intestinal soft tissue, 0/29 lymph nodes positive, negative margins, positive lymphovascular invasion without perineural invasion.  She did not have full colonoscopy prior to resection of her tumor due to near obstructive presentation. She received FOLFOX from 11/08/08 through 04/28/09.  CURRENT THERAPY:  watchful observation  INTERVAL HISTORY: Debra Graham 57 y.o. female returns for regular follow up.  It has been one year since we last saw her. She missed several appointments with [place] last summer. States that she made a follow-up appointment with [place] due to constipation. States she feels like she is not fully emptying her bowels and that there is something blocking her colon. She has had a few episodes of pain to her abdomen and states that it has felt like her insides were raw. Review of chart shows weight loss of about 35 lbs in 1 year; over past year diuretics have been adjusted and BS are better. Appetite is good. She is not actively trying to lose weight. She has had chronic depression but without suicidal/homocidal ideation.  She has not noticed hematochezia, melena.  She has chronic fatigue but is independent of all activities of daily living. She has DOE from deconditioning, obesity, and also has baseline CHF.  However, she denies PND, orthopnea.  She has neuropathy that predated chemotherapy.  Her neuropathy is reportedly stable.  She also reports a cough that has been present about 2 months. No fevers.  Patient denies headache, visual changes, confusion, drenching night sweats, palpable lymph node swelling, mucositis,  odynophagia, dysphagia, nausea vomiting, jaundice, chest pain, palpitation, gum bleeding, epistaxis, hematemesis, hemoptysis, abdominal pain, abdominal swelling, early satiety, melena, hematochezia, hematuria, skin rash, spontaneous bleeding, joint swelling, heat or cold intolerance, bowel bladder incontinence, back pain, focal motor weakness.     MEDICAL HISTORY: Past Medical History  Diagnosis Date  . Osteoarthritis   . Diabetes mellitus   . Hyperlipidemia   . History of cocaine abuse   . Hypertension   . Adenocarcinoma, colon 11/09    s/p subtotal colectomy with primary anastamosis by Dr. Johna Sheriff; Chemotherapy per Dr. Twanna Hy   . patient also endorses chronic intermittent abdominal aching and loose stools a/c with her colon cancer and Sx.   . Carpal tunnel syndrome   . Peripheral neuropathy   . Chronic systolic heart failure     LHC 01/04/12: Luminal irregularities, no significant CAD, elevated LVEDP;  Echocardiogram 01/03/12: Mild LVH, EF 35-40%, moderate LAE.    [name] Depression   . NICM (nonischemic cardiomyopathy)     ? HTN cardiomyopathy     SURGICAL HISTORY:  Past Surgical History  Procedure Date  . Subtotal colectomy with ileosigmoidostomy 10/09    MEDICATIONS: Current Outpatient Prescriptions  Medication Sig Dispense Refill  . amLODipine (NORVASC) 10 MG tablet Take 1 tablet (10 mg total) by mouth daily.  30 tablet  6  . amLODipine (NORVASC) 10 MG tablet TAKE 1 TABLET BY MOUTH EVERY DAY  30 tablet  6  . carvedilol (COREG) 6.25 MG tablet TAKE 1 AND 1/2 TABLETS TWICE DAILY  180 tablet  6  . DULoxetine (CYMBALTA) 60 MG capsule Take 1 capsule (60 mg total) by mouth daily.  30  capsule  2  . furosemide (LASIX) 40 MG tablet TAKE 1 TABLET 40 MG TWICE DAILY  60 tablet  6  . insulin glargine (LANTUS) 100 UNIT/ML injection Inject 14 Units into the skin at bedtime.  15 mL  1  . lisinopril (PRINIVIL,ZESTRIL) 40 MG tablet Take 1 tablet (40 mg total) by mouth daily.  30 tablet  5  .  metFORMIN (GLUCOPHAGE) 500 MG tablet Take 1 tablet (500 mg total) by mouth 2 (two) times daily with a meal.  60 tablet  11  . nitroGLYCERIN (NITROSTAT) 0.4 MG SL tablet Place 1 tablet (0.4 mg total) under the tongue every 5 (five) minutes. For chest pain. If more than 2 doses needed, call 911.  15 tablet  3  . QUEtiapine (SEROQUEL) 50 MG tablet Take 1 tablet (50 mg total) by mouth at bedtime.  30 tablet  11  . spironolactone (ALDACTONE) 12.5 mg TABS Take 0.5 tablets (12.5 mg total) by mouth daily.  30 tablet  5  . traMADol (ULTRAM) 50 MG tablet TAKE 1 TABLET EVERY 6 HOURS AS NEEDED FOR PAIN  90 tablet  1  . traZODone (DESYREL) 50 MG tablet Take 50 mg by mouth at bedtime as needed. As needed for sleep.        ALLERGIES:   has no known allergies.  REVIEW OF SYSTEMS:  The rest of the 14-point review of system was negative.   Filed Vitals:   12/24/12 1448  BP: 135/73  Pulse: 61  Temp: 99.3 F (37.4 C)  Resp: 20   Wt Readings from Last 3 Encounters:  12/24/12 237 lb 1.6 oz (107.548 kg)  09/22/12 249 lb 4.8 oz (113.082 kg)  05/14/12 245 lb 8 oz (111.358 kg)   ECOG Performance status: 1-2  PHYSICAL EXAMINATION:  General:  Obese woman in no acute distress.  Eyes:  no scleral icterus.  ENT:  There were no oropharyngeal lesions.  Neck was without thyromegaly.  Lymphatics:  Negative cervical, supraclavicular or axillary adenopathy.  Respiratory: lungs were clear bilaterally without wheezing or crackles.  Cardiovascular:  Regular rate and rhythm, S1/S2, without murmur, rub or gallop.  There was no pedal edema.  GI:  abdomen was soft, obese, nontender, nondistended, without organomegaly.  Muscoloskeletal:  no spinal tenderness of palpation of vertebral spine.  Skin exam was without echymosis, petichae.  Neuro exam was nonfocal.  Patient needed minimal assistance to get on and off exam table.   Gait was normal.  Patient was alerted and oriented.  Attention was good.   Language was appropriate.  Mood  was normal without depression.  Speech was not pressured.  Thought content was not tangential.     LABORATORY/RADIOLOGY DATA:  Lab Results  Component Value Date   WBC 8.1 12/24/2012   HGB 12.8 12/24/2012   HCT 37.1 12/24/2012   PLT 174 12/24/2012   GLUCOSE 151* 12/24/2012   CHOL 216* 01/03/2012   TRIG 187* 01/03/2012   HDL 59 01/03/2012   LDLCALC 120* 01/03/2012   ALT 8 12/24/2012   AST 9 12/24/2012   NA 139 12/24/2012   K 4.7 12/24/2012   CL 108* 12/24/2012   CREATININE 0.9 12/24/2012   BUN 16.4 12/24/2012   CO2 23 12/24/2012   TSH 0.334* 01/02/2012   INR 0.91 01/07/2012   HGBA1C 8.2 09/22/2012   MICROALBUR 331.17* 01/19/2011   IMAGING:  Ct FROM 05/06/12  *RADIOLOGY REPORT*  Clinical Data: Colon cancer.  CT CHEST, ABDOMEN AND PELVIS WITH CONTRAST  Technique:  Multidetector CT imaging of the chest, abdomen and  pelvis was performed following the standard protocol during bolus  administration of intravenous contrast.  Contrast: OMNIPAQUE IOHEXOL 300 MG/ML SOLN  Comparison: 05/15/2011.  CT CHEST  Findings: The chest wall is unremarkable. No breast masses,  supraclavicular or axillary lymphadenopathy. Small scattered lymph  nodes are noted. The thyroid gland is mildly enlarged but no  worrisome lesions. The bony thorax is intact. No destructive bone  lesions or spinal canal compromise. Moderate degenerative changes  again noted in the thoracic spine.  The heart is normal in size. No pericardial effusion. No  mediastinal or hilar lymphadenopathy. Stable ill-defined fatty  appearing soft tissue density in the anterior mediastinum is likely  residual thymic tissue. The aorta is normal in caliber. No  dissection. Mild atherosclerotic changes. Coronary artery  calcifications are noted. The esophagus is grossly normal.  Examination of the lung parenchyma demonstrates no worrisome  pulmonary nodules or acute pulmonary findings. No pleural  effusion.  IMPRESSION:  Stable CT appearance of the chest.  No findings for metastatic  disease.  CT ABDOMEN AND PELVIS  Findings: The liver is unremarkable and stable. No metastatic  hepatic disease. No biliary dilatation. The gallbladder is  normal. No common bile duct dilatation. The pancreas is normal.  The spleen is normal in size. Stable small splenic lesion, likely  benign hemangioma. The adrenal glands and kidneys are stable. A  left renal calculus is again demonstrated. A small right renal  cyst is noted.  The stomach, duodenum, small bowel and colon are unremarkable and  stable. There is a stable anterior abdominal wall hernia  containing small bowel loops. No obstruction. No mesenteric or  retroperitoneal masses or adenopathy. Stable small scattered lymph  nodes. The aorta is normal in caliber. No dissection. Mild  atherosclerotic calcifications. The major branch vessels are  patent.  The bladder is unremarkable. The uterus is surgically absent. No  pelvic mass, adenopathy or free pelvic fluid collections. No  inguinal mass or hernia.  The bony structures are unremarkable and stable. Stable pubic  symphysis and SI joint degenerative changes.  IMPRESSION:  Stable CT appearance of the abdomen/pelvis. No findings for  metastatic disease.  Original Report Authenticated By: P. Loralie Champagne, M.D.    ASSESSMENT AND PLAN:    1. History of colon cancer: CT results from June 2013 reviewed with patient today as she never returned to get the results. CT did not show any evidence of recurrent cancer. She now has weight loss with bowel changes and persistent cough. I have requested a CT of the chest/abdomen/pelvis to be done within 1 week. Her next surveillance colonoscopy with Dr. Elnoria Howard is due about July of 2014.   2. Diabetes mellitus, type II:  On insulin and Metformin per PCP. 3. Neuropathy:  due to probably both diabetes mellitus type 2 and from history of chemotherapy.  On Cymbalta. 4. Morbid obesity:  I strongly recommend patient again  to have an exercise routine.  She has not been compliant with this.  I recommended her to see General Surgery for gastric bypass.  She has not done this either.  5. Hypertension. On Norvasc and lisinopril per PCP 6. Hyperlipidemia.  Diet controlled at this time 7. Primary care. She has not had a recent mammogram. I have requested this today to be done in 1 month. 8. Follow-up. In 6 months or sooner if concerns on CT scan.

## 2012-12-24 NOTE — Patient Instructions (Addendum)
We will obtain a CT of your chest, abdomen, and pelvis within 1 week. We will call with results. You are due for a colonoscopy with Dr Elnoria Howard in July 2014. We will plan to see you back for a routine visit in about 6 months or sooner if there are concerns on the scan.  I have scheduled you for a mammogram within the next month.

## 2012-12-25 ENCOUNTER — Telehealth: Payer: Self-pay | Admitting: *Deleted

## 2012-12-25 NOTE — Telephone Encounter (Signed)
Pt left Vm states was told to call today for test results.

## 2012-12-25 NOTE — Telephone Encounter (Signed)
Informed pt of CEA normal per Belenda Cruise and will await CT scan.  Pt verbalized understanding and says CT is scheduled for 2/12/ 14

## 2012-12-25 NOTE — Telephone Encounter (Signed)
Tell her CEA is normal (0.6). We will await CT scans.

## 2012-12-31 ENCOUNTER — Ambulatory Visit (HOSPITAL_COMMUNITY): Payer: Medicare Other

## 2013-01-06 ENCOUNTER — Telehealth: Payer: Self-pay | Admitting: *Deleted

## 2013-01-06 ENCOUNTER — Ambulatory Visit (HOSPITAL_COMMUNITY)
Admission: RE | Admit: 2013-01-06 | Discharge: 2013-01-06 | Disposition: A | Discharge status: Home / Self Care | Payer: Medicare Other | Source: Ambulatory Visit | Attending: Oncology | Admitting: Oncology

## 2013-01-06 ENCOUNTER — Encounter (HOSPITAL_COMMUNITY): Payer: Self-pay

## 2013-01-06 DIAGNOSIS — Q619 Cystic kidney disease, unspecified: Secondary | ICD-10-CM | POA: Insufficient documentation

## 2013-01-06 DIAGNOSIS — I700 Atherosclerosis of aorta: Secondary | ICD-10-CM | POA: Diagnosis not present

## 2013-01-06 DIAGNOSIS — R634 Abnormal weight loss: Secondary | ICD-10-CM | POA: Diagnosis not present

## 2013-01-06 DIAGNOSIS — K439 Ventral hernia without obstruction or gangrene: Secondary | ICD-10-CM | POA: Insufficient documentation

## 2013-01-06 DIAGNOSIS — C189 Malignant neoplasm of colon, unspecified: Secondary | ICD-10-CM | POA: Diagnosis not present

## 2013-01-06 MED ORDER — IOHEXOL 300 MG/ML  SOLN
125.0000 mL | Freq: Once | INTRAMUSCULAR | Status: AC | PRN
  Administered 2013-01-06: 125 mL via INTRAVENOUS

## 2013-01-06 NOTE — Telephone Encounter (Signed)
Message copied by Wende Mott on Tue Jan 06, 2013  5:06 PM ------      Message from: Myrtis Ser      Created: Tue Jan 06, 2013  3:55 PM       Please call pt. Let her know that CT scan did not show any evidence of cancer in the chest, abdomen, or pelvis. Keep follow-up with GI and Dr Gaylyn Rong as scheduled. ------

## 2013-01-06 NOTE — Telephone Encounter (Signed)
No answer.  Left Vm for pt to return call regarding her results.

## 2013-01-12 ENCOUNTER — Encounter: Payer: Medicare Other | Admitting: Internal Medicine

## 2013-01-14 ENCOUNTER — Ambulatory Visit (HOSPITAL_COMMUNITY): Payer: Medicare Other | Attending: Oncology

## 2013-01-17 ENCOUNTER — Other Ambulatory Visit: Payer: Self-pay | Admitting: Internal Medicine

## 2013-01-26 ENCOUNTER — Ambulatory Visit (INDEPENDENT_AMBULATORY_CARE_PROVIDER_SITE_OTHER): Payer: Medicare Other | Admitting: Internal Medicine

## 2013-01-26 VITALS — BP 160/83 | HR 64 | Temp 99.6°F | Ht 66.0 in | Wt 243.5 lb

## 2013-01-26 DIAGNOSIS — I5022 Chronic systolic (congestive) heart failure: Secondary | ICD-10-CM | POA: Diagnosis not present

## 2013-01-26 DIAGNOSIS — R05 Cough: Secondary | ICD-10-CM

## 2013-01-26 DIAGNOSIS — F3289 Other specified depressive episodes: Secondary | ICD-10-CM

## 2013-01-26 DIAGNOSIS — R059 Cough, unspecified: Secondary | ICD-10-CM

## 2013-01-26 DIAGNOSIS — E119 Type 2 diabetes mellitus without complications: Secondary | ICD-10-CM

## 2013-01-26 DIAGNOSIS — E785 Hyperlipidemia, unspecified: Secondary | ICD-10-CM

## 2013-01-26 DIAGNOSIS — F329 Major depressive disorder, single episode, unspecified: Secondary | ICD-10-CM | POA: Diagnosis not present

## 2013-01-26 DIAGNOSIS — I10 Essential (primary) hypertension: Secondary | ICD-10-CM

## 2013-01-26 LAB — BASIC METABOLIC PANEL
CO2: 27 mEq/L (ref 19–32)
Calcium: 9.6 mg/dL (ref 8.4–10.5)
Creat: 0.82 mg/dL (ref 0.50–1.10)
Sodium: 138 mEq/L (ref 135–145)

## 2013-01-26 LAB — POCT GLYCOSYLATED HEMOGLOBIN (HGB A1C): Hemoglobin A1C: 7.8

## 2013-01-26 LAB — LIPID PANEL
Total CHOL/HDL Ratio: 3.1 Ratio
VLDL: 21 mg/dL (ref 0–40)

## 2013-01-26 LAB — GLUCOSE, CAPILLARY: Glucose-Capillary: 152 mg/dL — ABNORMAL HIGH (ref 70–99)

## 2013-01-26 NOTE — Assessment & Plan Note (Signed)
Lab Results  Component Value Date   HGBA1C 7.8 01/26/2013   HGBA1C 8.2 09/22/2012   HGBA1C 8.4 05/14/2012     Assessment:  Diabetes control: fair control  Progress toward A1C goal:  improved  Comments: ran out of pens for lantus but overall better compliance with improvement in HgbA1c from 8.2 ---> 7.8  Plan:  Medications:  continue current medications  Home glucose monitoring:   Frequency: 3 times a day   Timing: before meals  Instruction/counseling given: reminded to get eye exam  Educational resources provided: brochure  Self management tools provided: home glucose logbook  Other plans: refill Lantus pens, cont Lantus, f/u 3 months

## 2013-01-26 NOTE — Patient Instructions (Signed)
General Instructions:  We have provided Lantus pen and needles today. You blood sugar is doing better but we still have some improvement to do. Your blood pressure is high today, as well.  As soon as you get home, please take your medications that you didn't have time to take this morning. Remember to reschedule your mammogram. Keep your appointment with the eye doctor Dr. Dione Booze. We checked your cholesterol and kidney function today.  We will discuss the results at your next visit.   Treatment Goals:  Goals (1 Years of Data) as of 01/26/13         As of Today 12/24/12 09/22/12 09/22/12 05/14/12     Blood Pressure    . Blood Pressure < 140/90  160/83 135/73 162/90 185/93 142/74     Result Component    . HEMOGLOBIN A1C < 7.0  7.8  8.2  8.4    . LDL CALC < 100            Progress Toward Treatment Goals:  Treatment Goal 01/26/2013  Hemoglobin A1C improved  Blood pressure deteriorated    Self Care Goals & Plans:  Self Care Goal 01/26/2013  Manage my medications take my medicines as prescribed; bring my medications to every visit; refill my medications on time  Monitor my health keep track of my blood glucose; bring my glucose meter and log to each visit  Stop smoking go to the [employer] ([email or web address])    Home Blood Glucose Monitoring 01/26/2013  Check my blood sugar 3 times a day  When to check my blood sugar before meals     Care Management & Community Referrals:

## 2013-01-26 NOTE — Assessment & Plan Note (Signed)
BP Readings from Last 3 Encounters:  01/26/13 160/83  12/24/12 135/73  09/22/12 162/90    Lab Results  Component Value Date   NA 139 12/24/2012   K 4.7 12/24/2012   CREATININE 0.9 12/24/2012    Assessment:  Blood pressure control: moderately elevated  Progress toward BP goal:  deteriorated  Comments: didn't have time to take meds today  Plan:  Medications:  continue current medications  Educational resources provided: brochure  Self management tools provided:    Other plans: f/u 3 months, had had good control with last bp 135/73, followed by [facility]

## 2013-01-26 NOTE — Progress Notes (Signed)
  Subjective:    Patient ID: Debra Graham, female    DOB: 1956/10/16, 57 y.o.   MRN: [id number]  HPI  Pt presents for f/u of diabetes and htn.  States that she has been w/o Lantus needles for the pen.  Continues to have social stressors in terms of housing and sons.  Review of Systems     Objective:   Physical Exam  Constitutional: She is oriented to person, place, and time. She appears well-developed and well-nourished. No distress.  HENT:  Head: Normocephalic and atraumatic.  Eyes: Conjunctivae and EOM are normal. Pupils are equal, round, and reactive to light.  Neck: Normal range of motion. Neck supple. No thyromegaly present.  Cardiovascular: Normal rate, regular rhythm, normal heart sounds and intact distal pulses.   Pulmonary/Chest: Effort normal and breath sounds normal.  Abdominal: Soft. Bowel sounds are normal. There is no tenderness.  Obese   Musculoskeletal: Normal range of motion.  1+ LE edema bilaterally  Neurological: She is alert and oriented to person, place, and time.  Skin: Skin is warm and dry.  Psychiatric: She has a normal mood and affect.          Assessment & Plan:  1. DM T2: better control, HgbA1c 7.8, gav sample Lantus with pens today Lot# [id number] exp 04/2011 2. Htn: above goal today, sbp 160s, pt didn't take medications today do to rushing for appointments -no change in regimen 3. ichemic cardiomyopathy: denies chest, sob, has not been to Cardiology in ~ 1 year, encouraged to f/u 4. Cough, chronic: pt states has had slight cough ever since starting lisinopril but it does not bother her much and would prefer to stay on it rather than try alternative 5. Preventative Care: pt will reschedule Mammogram herself, we have reschedule appt with Dr. Dione Booze for eye care, she declines flu shot, s/p hysterectomy

## 2013-02-02 ENCOUNTER — Ambulatory Visit (HOSPITAL_COMMUNITY): Payer: Medicare Other

## 2013-02-10 ENCOUNTER — Ambulatory Visit (HOSPITAL_COMMUNITY)
Admission: RE | Admit: 2013-02-10 | Discharge: 2013-02-10 | Disposition: A | Discharge status: Home / Self Care | Payer: Medicare Other | Source: Ambulatory Visit | Attending: Oncology | Admitting: Oncology

## 2013-02-10 DIAGNOSIS — Z1231 Encounter for screening mammogram for malignant neoplasm of breast: Secondary | ICD-10-CM | POA: Diagnosis not present

## 2013-02-17 ENCOUNTER — Other Ambulatory Visit: Payer: Self-pay | Admitting: Internal Medicine

## 2013-02-24 ENCOUNTER — Encounter: Payer: Self-pay | Admitting: Internal Medicine

## 2013-02-24 DIAGNOSIS — R269 Unspecified abnormalities of gait and mobility: Secondary | ICD-10-CM | POA: Insufficient documentation

## 2013-03-16 ENCOUNTER — Ambulatory Visit: Payer: Medicare Other | Admitting: Cardiology

## 2013-03-21 ENCOUNTER — Other Ambulatory Visit: Payer: Self-pay | Admitting: Internal Medicine

## 2013-03-24 ENCOUNTER — Encounter: Payer: Self-pay | Admitting: Cardiology

## 2013-04-20 ENCOUNTER — Encounter: Payer: Medicare Other | Admitting: Internal Medicine

## 2013-04-27 ENCOUNTER — Encounter: Payer: Medicare Other | Admitting: Internal Medicine

## 2013-05-18 ENCOUNTER — Other Ambulatory Visit: Payer: Self-pay | Admitting: Internal Medicine

## 2013-05-19 ENCOUNTER — Other Ambulatory Visit: Payer: Self-pay | Admitting: Cardiology

## 2013-05-19 ENCOUNTER — Other Ambulatory Visit: Payer: Self-pay | Admitting: Internal Medicine

## 2013-05-28 ENCOUNTER — Other Ambulatory Visit: Payer: Self-pay

## 2013-06-24 ENCOUNTER — Ambulatory Visit: Payer: Medicare Other | Admitting: Hematology and Oncology

## 2013-06-24 ENCOUNTER — Other Ambulatory Visit: Payer: Medicare Other | Admitting: Lab

## 2013-06-26 ENCOUNTER — Telehealth: Payer: Self-pay | Admitting: Hematology and Oncology

## 2013-06-26 NOTE — Telephone Encounter (Signed)
Pt called in to r/s 8/6 appt and was given new appt for lb/fu 9/25.

## 2013-07-21 ENCOUNTER — Other Ambulatory Visit: Payer: Self-pay | Admitting: Internal Medicine

## 2013-07-24 ENCOUNTER — Other Ambulatory Visit: Payer: Self-pay | Admitting: Internal Medicine

## 2013-07-29 NOTE — Telephone Encounter (Signed)
Called to pharm 

## 2013-08-07 ENCOUNTER — Telehealth: Payer: Self-pay | Admitting: *Deleted

## 2013-08-07 NOTE — Telephone Encounter (Signed)
Lm informed the pt that her arrival time for 08/13/13 has changed. gv appt for 08/13/13 w/ labs@ 12:15pm and ov@ 12:30pm.the patient is aware...td

## 2013-08-13 ENCOUNTER — Telehealth: Payer: Self-pay | Admitting: Hematology and Oncology

## 2013-08-13 ENCOUNTER — Other Ambulatory Visit: Payer: Medicare Other

## 2013-08-13 ENCOUNTER — Ambulatory Visit: Payer: Medicare Other | Admitting: Hematology and Oncology

## 2013-08-13 NOTE — Telephone Encounter (Signed)
pt called to r/s appt...done °

## 2013-08-24 ENCOUNTER — Ambulatory Visit: Payer: Medicare Other | Admitting: Hematology and Oncology

## 2013-08-24 ENCOUNTER — Other Ambulatory Visit: Payer: Medicare Other | Admitting: Lab

## 2013-09-14 ENCOUNTER — Encounter: Payer: Self-pay | Admitting: Internal Medicine

## 2013-09-14 ENCOUNTER — Encounter: Payer: Medicare Other | Admitting: Internal Medicine

## 2013-09-16 ENCOUNTER — Telehealth: Payer: Self-pay | Admitting: Dietician

## 2013-09-24 NOTE — Telephone Encounter (Signed)
Unable to contact patient to find out when she had her last eye exam

## 2013-09-30 ENCOUNTER — Other Ambulatory Visit: Payer: Self-pay | Admitting: Internal Medicine

## 2013-12-24 ENCOUNTER — Other Ambulatory Visit: Payer: Self-pay | Admitting: Internal Medicine

## 2014-02-01 ENCOUNTER — Telehealth: Payer: Self-pay | Admitting: *Deleted

## 2014-02-01 ENCOUNTER — Ambulatory Visit (INDEPENDENT_AMBULATORY_CARE_PROVIDER_SITE_OTHER): Payer: Medicare Other | Admitting: Dietician

## 2014-02-01 ENCOUNTER — Other Ambulatory Visit: Payer: Self-pay | Admitting: Hematology and Oncology

## 2014-02-01 ENCOUNTER — Ambulatory Visit (INDEPENDENT_AMBULATORY_CARE_PROVIDER_SITE_OTHER): Payer: Medicare Other | Admitting: Internal Medicine

## 2014-02-01 ENCOUNTER — Encounter: Payer: Self-pay | Admitting: Dietician

## 2014-02-01 ENCOUNTER — Encounter: Payer: Self-pay | Admitting: Internal Medicine

## 2014-02-01 VITALS — BP 194/96 | HR 66 | Temp 98.3°F | Ht 66.0 in | Wt 244.4 lb

## 2014-02-01 DIAGNOSIS — Z0000 Encounter for general adult medical examination without abnormal findings: Secondary | ICD-10-CM

## 2014-02-01 DIAGNOSIS — Z23 Encounter for immunization: Secondary | ICD-10-CM

## 2014-02-01 DIAGNOSIS — H43399 Other vitreous opacities, unspecified eye: Secondary | ICD-10-CM | POA: Insufficient documentation

## 2014-02-01 DIAGNOSIS — E119 Type 2 diabetes mellitus without complications: Secondary | ICD-10-CM | POA: Diagnosis not present

## 2014-02-01 DIAGNOSIS — I10 Essential (primary) hypertension: Secondary | ICD-10-CM | POA: Diagnosis not present

## 2014-02-01 DIAGNOSIS — G609 Hereditary and idiopathic neuropathy, unspecified: Secondary | ICD-10-CM

## 2014-02-01 DIAGNOSIS — Z85038 Personal history of other malignant neoplasm of large intestine: Secondary | ICD-10-CM | POA: Diagnosis not present

## 2014-02-01 DIAGNOSIS — C189 Malignant neoplasm of colon, unspecified: Secondary | ICD-10-CM

## 2014-02-01 DIAGNOSIS — F329 Major depressive disorder, single episode, unspecified: Secondary | ICD-10-CM

## 2014-02-01 DIAGNOSIS — Z5948 Other specified lack of adequate food: Secondary | ICD-10-CM

## 2014-02-01 DIAGNOSIS — T730XXA Starvation, initial encounter: Secondary | ICD-10-CM

## 2014-02-01 DIAGNOSIS — F3289 Other specified depressive episodes: Secondary | ICD-10-CM

## 2014-02-01 DIAGNOSIS — I5022 Chronic systolic (congestive) heart failure: Secondary | ICD-10-CM | POA: Diagnosis not present

## 2014-02-01 DIAGNOSIS — E785 Hyperlipidemia, unspecified: Secondary | ICD-10-CM | POA: Diagnosis not present

## 2014-02-01 LAB — BASIC METABOLIC PANEL WITH GFR
BUN: 9 mg/dL (ref 6–23)
CALCIUM: 8.9 mg/dL (ref 8.4–10.5)
CHLORIDE: 104 meq/L (ref 96–112)
CO2: 30 meq/L (ref 19–32)
Creat: 0.76 mg/dL (ref 0.50–1.10)
GFR, Est African American: >89 mL/min
GFR, Est Non African American: 87 mL/min
GLUCOSE: 141 mg/dL — AB (ref 70–99)
POTASSIUM: 4.1 meq/L (ref 3.5–5.3)
SODIUM: 142 meq/L (ref 135–145)

## 2014-02-01 LAB — LIPID PANEL
Cholesterol: 267 mg/dL — ABNORMAL HIGH (ref 0–200)
HDL: 82 mg/dL (ref >39)
LDL Cholesterol: 146 mg/dL — ABNORMAL HIGH (ref 0–99)
Total CHOL/HDL Ratio: 3.3 Ratio
Triglycerides: 196 mg/dL — ABNORMAL HIGH (ref <150)
VLDL: 39 mg/dL (ref 0–40)

## 2014-02-01 LAB — GLUCOSE, CAPILLARY: GLUCOSE-CAPILLARY: 200 mg/dL — AB (ref 70–99)

## 2014-02-01 LAB — POCT GLYCOSYLATED HEMOGLOBIN (HGB A1C): HEMOGLOBIN A1C: 8.3

## 2014-02-01 LAB — HM DIABETES EYE EXAM

## 2014-02-01 MED ORDER — GLUCOSE BLOOD VI STRP: ORAL_STRIP | Status: DC

## 2014-02-01 MED ORDER — ONETOUCH DELICA LANCETS FINE MISC: Status: DC

## 2014-02-01 MED ORDER — ONETOUCH ULTRA MINI W/DEVICE KIT: PACK | Status: DC

## 2014-02-01 NOTE — Patient Instructions (Signed)
Wrote instruction in a letter due to not having a chart note available at the time

## 2014-02-01 NOTE — Progress Notes (Signed)
   Subjective:    Patient ID: Debra Graham, female    DOB: 1956/10/26, 58 y.o.   MRN: [id number]  HPI  Presents for f/u of diabetes, hypertension and depression.  Has had several missed appointments.  Reports continued stressors in the home involving her children (incarcerations, drug use, mental instability), paternity of grandchildren, financial constraints, etc.  States that she cant remember the exact date that she last took her bp meds but it was sometime this weekend.  No bp meds today and bp elevated at 192/90 pulse 79 bpm.   States that she sometimes she misses doses of Lantus and often misses the evening dose of Metformin.  States that she had been in a depression whereby she stayed in bed all day.  Feels like she is now "coming out of it" since her sister and 'good' son had an "intervention".  In the meantime, her disability renewal is now jeopardized because she hadn't checked her mail.  Has not been doing the Quetipine every night. Doesn't sleep much at night, falls asleep 4am.  States that she notices a difference when she missed her doses of Cymbalta for a while but doesn't feel like helps very much with her depression.  Complaining of varicous veins and tingling and numbness in her fingers bilaterally from her "carpal tunnel".  Sleeping on the couch because bedbug outbreak in the home some time ago.  Review of Systems  Constitutional: Positive for fatigue. Negative for fever.  Eyes: Positive for visual disturbance.       Bilateral floaters  Cardiovascular: Negative for chest pain, palpitations and leg swelling.  Gastrointestinal: Negative for abdominal pain.  Genitourinary: Negative for dysuria.  Neurological: Positive for headaches.  Hematological: Does not bruise/bleed easily.  Psychiatric/Behavioral: Positive for dysphoric mood.       Objective:   Physical Exam  Constitutional: She is oriented to person, place, and time. She appears well-developed and  well-nourished. No distress.  Tearful when speaking of children  HENT:  Head: Normocephalic and atraumatic.  Eyes: Conjunctivae and EOM are normal. Pupils are equal, round, and reactive to light.  Neck: Normal range of motion. Neck supple. No thyromegaly present.  Cardiovascular: Normal rate, regular rhythm, normal heart sounds and intact distal pulses.   Pulmonary/Chest: Effort normal and breath sounds normal. She has no wheezes.  Abdominal: Soft. Bowel sounds are normal.  Musculoskeletal: She exhibits no edema.  Neurological: She is alert and oriented to person, place, and time.  Skin: Skin is warm and dry.  Psychiatric: She exhibits a depressed mood. She expresses no homicidal and no suicidal ideation.          Assessment & Plan:  See separate problem list charting:  Gave info about [facility] re: food and disability renewal Check lipid panel Check BMET

## 2014-02-01 NOTE — Assessment & Plan Note (Signed)
Check lipid panel today 

## 2014-02-01 NOTE — Assessment & Plan Note (Signed)
Had worsened to the point whereby she stayed in bed all day and neglected her health. States that her family had an "intervention" and she is ready to do better for herself Reports that she has not been taking the Seroquel consistently  -cont Cymbalta and Seroquel -refer to [facility] (former pt of [employer])

## 2014-02-01 NOTE — Assessment & Plan Note (Signed)
No signs of overload. -f/u with Cardiology

## 2014-02-01 NOTE — Assessment & Plan Note (Signed)
Pneumovax today, declines flu vacc and tdap

## 2014-02-01 NOTE — Progress Notes (Signed)
Medical Nutrition Therapy:  Appt start time: 3343 end time:  1630.  Assessment:  Primary concerns today: Blood sugar control.  Patient able to verbalize some healthier options to improve her blood sugar. However, says she cannot adhere to them due to emotional and financial stressors in her life.  Forgets her medicine and is agreeable to pill box and taking her lantus earlier in the day.  Usual eating pattern was not covered at today's visit. Usual physical activity includes activities of daily living - gets winded with short distances. Frequent foods include fried foods, meats, canned fruits and canned and frozen vegetables.  Avoided foods include .    Progress Towards Goal(s):  In progress.   Nutritional Diagnosis:  NB-1.3 Not ready for diet/lifestyle change  As related to competing values of emotional and financial stress .  As evidenced by her depression, tearfulness, report and short term memory problems..    Intervention:  Nutrition counseling about energy needed to make changes in daily habits. Gave her information about [employer] in [place]. One touch Ultra mini Meter provided. and coordination- requested strips and lancet rx from physician   Monitoring/Evaluation:  Dietary intake, exercise, meter, and body weight at each visit .

## 2014-02-01 NOTE — Assessment & Plan Note (Signed)
Retinal scan today. To f/u with Dr. Katy Fitch in May 2015

## 2014-02-01 NOTE — Telephone Encounter (Signed)
I put POF for labs and rtn in 2 weeks

## 2014-02-01 NOTE — Assessment & Plan Note (Signed)
BP Readings from Last 3 Encounters:  02/01/14 192/90  01/26/13 160/83  12/24/12 135/73    Lab Results  Component Value Date   NA 138 01/26/2013   K 5.0 01/26/2013   CREATININE 0.82 01/26/2013    Assessment: Blood pressure control: severely elevated Progress toward BP goal:  deteriorated Comments: hasnt taken meds for the past several days  Plan: Medications:  continue current medications Educational resources provided: brochure Self management tools provided: home blood pressure logbook Other plans: cont amlodipine 10 mg qd, carvedilol 6.25 bid, lisinopril 40 mg qd, spironolactone 25 mg qd

## 2014-02-01 NOTE — Assessment & Plan Note (Addendum)
Overdue for screening colonoscopy Will try to get her rescheduled with Oncology as she has not f/u since Dr. Lamonte Sakai left the practice.  She does not know which oncologist for which she has been re-assigned.

## 2014-02-01 NOTE — Assessment & Plan Note (Signed)
Complains of numbness and tingling in bilateral hands.  Likely secondary to cont poor diabetic control.

## 2014-02-01 NOTE — Progress Notes (Signed)
Pt refused Diabetic Foot Exam today.  Staing she has a hard time getting her shoes back on that she wore today.  Said she would come back with easier shoes to put on.  Sander Nephew, RN 02/01/2014 3:45 PM

## 2014-02-01 NOTE — Telephone Encounter (Signed)
Call from Dr. Kathline Magic office asking for pt to be scheduled for routine follow up with Dr. Alvy Bimler.  Former pt of Dr. Agustina Caroli and pt missed several appts w/ Dr. Alvy Bimler last Fall.

## 2014-02-01 NOTE — Patient Instructions (Addendum)
General Instructions: You received the Pneumonia vaccination today. We will check your cholesterol and kidney function. We will check your eyes, as well. We will try to assist you as much as we can today with your overdue follow-up appointments with your specialist. Please start taking your medications as prescribed. Consider going to [facility] for mental wellness.  They have walk-in hours. Return to clinic in 1 month so that we may continue to work on all of your health care needs.  Please bring your medicines with you each time you come.   Medicines may be:  Eye drops  Herbal   Vitamins  Pills  Seeing these help [place] take care of you.    Treatment Goals:  Goals (1 Years of Data) as of 02/01/14         As of Today As of Today 01/26/13     Blood Pressure    . Blood Pressure < 140/90  194/96 192/90 160/83     Lifestyle    . Prevent Falls         Result Component    . HEMOGLOBIN A1C < 7.0  8.3  7.8    . LDL CALC < 100    122      Progress Toward Treatment Goals:  Treatment Goal 02/01/2014  Hemoglobin A1C deteriorated  Blood pressure deteriorated  Stop smoking smoking the same amount  Prevent falls unchanged    Self Care Goals & Plans:  Self Care Goal 02/01/2014  Manage my medications take my medicines as prescribed; bring my medications to every visit; refill my medications on time  Monitor my health -  Eat healthy foods drink diet soda or water instead of juice or soda; eat more vegetables; eat foods that are low in salt; eat baked foods instead of fried foods; eat fruit for snacks and desserts  Stop smoking -    Home Blood Glucose Monitoring 02/01/2014  Check my blood sugar 3 times a day  When to check my blood sugar before meals     Care Management & Community Referrals:  Referral 02/01/2014  Referrals made for care management support social worker

## 2014-02-01 NOTE — Assessment & Plan Note (Addendum)
Lab Results  Component Value Date   HGBA1C 8.3 02/01/2014   HGBA1C 7.8 01/26/2013   HGBA1C 8.2 09/22/2012     Assessment: Diabetes control: fair control Progress toward A1C goal:  deteriorated Comments: does not adhere to evening dose of metformin daily, sometimes forgets to take her Lantus, states, cbgs run in 200s but hasnt checked in weeks because couldnt afford lancets/strips at the time  Plan: Medications:  continue current medications Home glucose monitoring: Frequency: 3 times a day Timing: before meals Instruction/counseling given: reminded to get eye exam, reminded to bring blood glucose meter & log to each visit, reminded to bring medications to each visit, discussed foot care and discussed diet Educational resources provided: brochure Self management tools provided: copy of home glucose meter download;home glucose logbook Other plans: wil not change regimen today given that she has been poorly compliant, cont Lantus 14 units for now, and metformin 500 mg bid.   -foot check today--> pt decline secondary to difficulty getting her particular shoes off -retinal scan today -Pneumovax today -consult with DME today

## 2014-02-02 ENCOUNTER — Telehealth: Payer: Self-pay | Admitting: Hematology and Oncology

## 2014-02-02 NOTE — Telephone Encounter (Signed)
lmonvm advising the pt of her appts on 02/15/2014 for lab and md.

## 2014-02-04 ENCOUNTER — Telehealth: Payer: Self-pay | Admitting: Licensed Clinical Social Worker

## 2014-02-04 NOTE — Telephone Encounter (Signed)
Debra Graham was referred to CSW for multiple family stressors.  Pt has questions regarding disability, lack of food and mental health.  Debra Graham was referred to [facility], as pt was a prior client.  CSW would like to offer [facility] care management for added support.  CSW placed called to pt.  CSW left message requesting return call. CSW provided contact hours and phone number.

## 2014-02-08 ENCOUNTER — Encounter: Payer: Self-pay | Admitting: *Deleted

## 2014-02-08 ENCOUNTER — Other Ambulatory Visit: Payer: Self-pay | Admitting: Internal Medicine

## 2014-02-08 DIAGNOSIS — E113599 Type 2 diabetes mellitus with proliferative diabetic retinopathy without macular edema, unspecified eye: Secondary | ICD-10-CM

## 2014-02-09 NOTE — Telephone Encounter (Signed)
CSW placed called to pt.  CSW left message requesting return call. CSW provided contact hours and phone number. 

## 2014-02-11 NOTE — Telephone Encounter (Signed)
CSW placed called to pt.  CSW left message requesting return call. CSW provided contact hours and phone number. 

## 2014-02-14 ENCOUNTER — Other Ambulatory Visit: Payer: Self-pay | Admitting: Hematology and Oncology

## 2014-02-15 ENCOUNTER — Ambulatory Visit: Payer: Medicare Other | Admitting: Hematology and Oncology

## 2014-02-15 ENCOUNTER — Other Ambulatory Visit: Payer: Self-pay | Admitting: Gastroenterology

## 2014-02-15 ENCOUNTER — Other Ambulatory Visit: Payer: Medicare Other

## 2014-02-15 DIAGNOSIS — K649 Unspecified hemorrhoids: Secondary | ICD-10-CM | POA: Diagnosis not present

## 2014-02-15 DIAGNOSIS — K5730 Diverticulosis of large intestine without perforation or abscess without bleeding: Secondary | ICD-10-CM | POA: Diagnosis not present

## 2014-02-15 DIAGNOSIS — Z85038 Personal history of other malignant neoplasm of large intestine: Secondary | ICD-10-CM | POA: Diagnosis not present

## 2014-02-16 ENCOUNTER — Telehealth: Payer: Self-pay | Admitting: Hematology and Oncology

## 2014-02-16 ENCOUNTER — Other Ambulatory Visit: Payer: Self-pay | Admitting: *Deleted

## 2014-02-16 NOTE — Telephone Encounter (Signed)
lmonvm on cell for pt re appt for 4/29 and mailed schedule. primary number not taking calls.

## 2014-02-18 NOTE — Telephone Encounter (Signed)
CSW placed called to pt.  CSW left message requesting return call. CSW provided contact hours and phone number.  Letter mailed, as pt has not returned messages.

## 2014-02-24 ENCOUNTER — Telehealth: Payer: Self-pay | Admitting: Dietician

## 2014-02-26 NOTE — Telephone Encounter (Signed)
Unable to reach patient by phone to follow up on self monitoring of her diabetes and supplies for same.

## 2014-03-01 NOTE — Progress Notes (Signed)
03-01-14 4580 Informed "Caryl Pina" of Dr. Ulyses Amor office. No return calls from Patient with [phone] only phone #, unsure if pt. Getting messages or will show for procedure planned 03-05-14. Asked her to have patient return call [phone] if able to communicate with patient. W. Floy Sabina

## 2014-03-01 NOTE — Progress Notes (Signed)
03-01-14 0940 note. Not sure why note message formated incorrectly. Pt. To return call [phone], talked with Ashley at 0940. Addendum. 

## 2014-03-05 ENCOUNTER — Encounter (HOSPITAL_COMMUNITY): Payer: Medicare Other | Admitting: Registered Nurse

## 2014-03-05 ENCOUNTER — Encounter (HOSPITAL_COMMUNITY): Payer: Self-pay | Admitting: Registered Nurse

## 2014-03-05 ENCOUNTER — Encounter (HOSPITAL_COMMUNITY)
Admission: RE | Disposition: A | Discharge status: Home / Self Care | Payer: Self-pay | Source: Ambulatory Visit | Attending: Gastroenterology

## 2014-03-05 ENCOUNTER — Ambulatory Visit (HOSPITAL_COMMUNITY): Payer: Medicare Other | Admitting: Registered Nurse

## 2014-03-05 ENCOUNTER — Ambulatory Visit (HOSPITAL_COMMUNITY)
Admission: RE | Admit: 2014-03-05 | Discharge: 2014-03-05 | Disposition: A | Discharge status: Home / Self Care | Payer: Medicare Other | Source: Ambulatory Visit | Attending: Gastroenterology | Admitting: Gastroenterology

## 2014-03-05 DIAGNOSIS — F17200 Nicotine dependence, unspecified, uncomplicated: Secondary | ICD-10-CM | POA: Diagnosis not present

## 2014-03-05 DIAGNOSIS — Z85038 Personal history of other malignant neoplasm of large intestine: Secondary | ICD-10-CM | POA: Insufficient documentation

## 2014-03-05 DIAGNOSIS — I509 Heart failure, unspecified: Secondary | ICD-10-CM | POA: Diagnosis not present

## 2014-03-05 DIAGNOSIS — Z79899 Other long term (current) drug therapy: Secondary | ICD-10-CM | POA: Diagnosis not present

## 2014-03-05 DIAGNOSIS — Z794 Long term (current) use of insulin: Secondary | ICD-10-CM | POA: Insufficient documentation

## 2014-03-05 DIAGNOSIS — I10 Essential (primary) hypertension: Secondary | ICD-10-CM | POA: Diagnosis not present

## 2014-03-05 DIAGNOSIS — Z1211 Encounter for screening for malignant neoplasm of colon: Secondary | ICD-10-CM | POA: Diagnosis not present

## 2014-03-05 DIAGNOSIS — Z980 Intestinal bypass and anastomosis status: Secondary | ICD-10-CM | POA: Diagnosis not present

## 2014-03-05 DIAGNOSIS — E119 Type 2 diabetes mellitus without complications: Secondary | ICD-10-CM | POA: Insufficient documentation

## 2014-03-05 DIAGNOSIS — R079 Chest pain, unspecified: Secondary | ICD-10-CM | POA: Diagnosis not present

## 2014-03-05 DIAGNOSIS — Z9049 Acquired absence of other specified parts of digestive tract: Secondary | ICD-10-CM | POA: Diagnosis not present

## 2014-03-05 DIAGNOSIS — E785 Hyperlipidemia, unspecified: Secondary | ICD-10-CM | POA: Insufficient documentation

## 2014-03-05 DIAGNOSIS — K5730 Diverticulosis of large intestine without perforation or abscess without bleeding: Secondary | ICD-10-CM | POA: Insufficient documentation

## 2014-03-05 DIAGNOSIS — Z90710 Acquired absence of both cervix and uterus: Secondary | ICD-10-CM | POA: Diagnosis not present

## 2014-03-05 HISTORY — PX: COLONOSCOPY: SHX5424

## 2014-03-05 LAB — GLUCOSE, CAPILLARY: GLUCOSE-CAPILLARY: 234 mg/dL — AB (ref 70–99)

## 2014-03-05 SURGERY — COLONOSCOPY
Anesthesia: Monitor Anesthesia Care

## 2014-03-05 MED ORDER — PROPOFOL 10 MG/ML IV BOLUS
INTRAVENOUS | Status: AC
  Filled 2014-03-05: qty 20

## 2014-03-05 MED ORDER — SODIUM CHLORIDE 0.9 % IV SOLN
INTRAVENOUS | Status: DC
Start: 2014-03-05 — End: 2014-03-05

## 2014-03-05 MED ORDER — PROPOFOL INFUSION 10 MG/ML OPTIME
INTRAVENOUS | Status: DC | PRN
  Administered 2014-03-05: 100 ug/kg/min via INTRAVENOUS

## 2014-03-05 MED ORDER — LIDOCAINE HCL (CARDIAC) 20 MG/ML IV SOLN
INTRAVENOUS | Status: DC | PRN
  Administered 2014-03-05: 100 mg via INTRAVENOUS

## 2014-03-05 MED ORDER — LIDOCAINE HCL (CARDIAC) 20 MG/ML IV SOLN
INTRAVENOUS | Status: AC
  Filled 2014-03-05: qty 5

## 2014-03-05 MED ORDER — LACTATED RINGERS IV SOLN
INTRAVENOUS | Status: DC
  Administered 2014-03-05: 1000 mL via INTRAVENOUS

## 2014-03-05 MED ORDER — MIDAZOLAM HCL 2 MG/2ML IJ SOLN
INTRAMUSCULAR | Status: AC
  Filled 2014-03-05: qty 2

## 2014-03-05 MED ORDER — MIDAZOLAM HCL 5 MG/5ML IJ SOLN
INTRAMUSCULAR | Status: DC | PRN
  Administered 2014-03-05: 2 mg via INTRAVENOUS

## 2014-03-05 MED ORDER — KETAMINE HCL 10 MG/ML IJ SOLN
INTRAMUSCULAR | Status: AC
  Filled 2014-03-05: qty 1

## 2014-03-05 MED ORDER — KETAMINE HCL 10 MG/ML IJ SOLN
INTRAMUSCULAR | Status: DC | PRN
  Administered 2014-03-05: 20 mg via INTRAVENOUS

## 2014-03-05 NOTE — Interval H&P Note (Signed)
History and Physical Interval Note:  03/05/2014 12:16 PM  Debra Graham  has presented today for surgery, with the diagnosis of screening/personal history of colon cancer  The various methods of treatment have been discussed with the patient and family. After consideration of risks, benefits and other options for treatment, the patient has consented to  Procedure(s): COLONOSCOPY (N/A) as a surgical intervention .  The patient's history has been reviewed, patient examined, no change in status, stable for surgery.  I have reviewed the patient's chart and labs.  Questions were answered to the patient's satisfaction.     [name]

## 2014-03-05 NOTE — Op Note (Signed)
[facility] [place], [address]   COLONOSCOPY PROCEDURE REPORT  PATIENT: Debra Graham, Debra Graham  MR#: [id number] BIRTHDATE: 05/17/1956 , 27  yrs. old GENDER: Female ENDOSCOPIST: Carol Ada, MD REFERRED BY: PROCEDURE DATE:  03/05/2014 PROCEDURE:   Colonoscopy, diagnostic ASA CLASS:   Class III INDICATIONS: Personal history of colon cancer MEDICATIONS: MAC sedation, administered by CRNA  DESCRIPTION OF PROCEDURE:   After the risks benefits and alternatives of the procedure were thoroughly explained, informed consent was obtained.  A digital rectal exam revealed no abnormalities of the rectum.   The Pentax Colonoscope [id number] endoscope was introduced through the anus and advanced to the terminal ileum which was intubated for a short distance. No adverse events experienced.   The quality of the prep was excellent.  The instrument was then slowly withdrawn as the colon was fully examined.      FINDINGS: The remnant colon is approximately 40-50 cm.  There is a side to side anastamosis of the colon and small bowel, which results in the three lumens visualized endoscopically.  Scattered diverticula were found int he remnant.  No evidence of any masses, polyps, inflammation, ulcerations, or vascular abnormalities. The time to cecum=  .  Withdrawal time=  .  The scope was withdrawn and the procedure completed. COMPLICATIONS: There were no complications.  ENDOSCOPIC IMPRESSION: 1) Intact coloileal anastamosis. 2) Diverticula.  RECOMMENDATIONS: 1) Repeat colonoscopy in 5 years.  eSigned:  Carol Ada, MD 03/05/2014 1:06 PM   cc:

## 2014-03-05 NOTE — H&P (View-Only) (Signed)
03-01-14 0940 note. Not sure why note message formated incorrectly. Pt. To return call [phone], talked with Caryl Pina at [phone]. Addendum.

## 2014-03-05 NOTE — Transfer of Care (Signed)
Immediate Anesthesia Transfer of Care Note  Patient: Debra Graham  Procedure(s) Performed: Procedure(s): COLONOSCOPY (N/A)  Patient Location: PACU and Endoscopy Unit  Anesthesia Type:MAC  Level of Consciousness: awake, alert , oriented and patient cooperative  Airway & Oxygen Therapy: Patient Spontanous Breathing and Patient connected to face mask oxygen  Post-op Assessment: Report given to PACU RN, Post -op Vital signs reviewed and stable and Patient moving all extremities  Post vital signs: Reviewed and stable  Complications: No apparent anesthesia complications

## 2014-03-05 NOTE — Anesthesia Preprocedure Evaluation (Addendum)
Anesthesia Evaluation  Patient identified by MRN, date of birth, ID band Patient awake    Reviewed: Allergy & Precautions, H&P , NPO status , Patient's Chart, lab work & pertinent test results  History of Anesthesia Complications (+) AWARENESS UNDER ANESTHESIA  Airway Mallampati: II TM Distance: >3 FB Neck ROM: Full    Dental no notable dental hx. (+) Upper Dentures, Edentulous Upper   Pulmonary Current Smoker,  breath sounds clear to auscultation  Pulmonary exam normal       Cardiovascular hypertension, Pt. on medications +CHF Rhythm:Regular Rate:Normal     Neuro/Psych negative neurological ROS  negative psych ROS   GI/Hepatic negative GI ROS, Neg liver ROS,   Endo/Other  diabetes, Type 2, Oral Hypoglycemic Agents, Insulin Dependent  Renal/GU negative Renal ROS  negative genitourinary   Musculoskeletal negative musculoskeletal ROS (+)   Abdominal   Peds negative pediatric ROS (+)  Hematology negative hematology ROS (+)   Anesthesia Other Findings   Reproductive/Obstetrics negative OB ROS                          Anesthesia Physical Anesthesia Plan  ASA: III  Anesthesia Plan: MAC   Post-op Pain Management:    Induction:   Airway Management Planned: Simple Face Mask  Additional Equipment:   Intra-op Plan:   Post-operative Plan:   Informed Consent: I have reviewed the patients History and Physical, chart, labs and discussed the procedure including the risks, benefits and alternatives for the proposed anesthesia with the patient or authorized representative who has indicated his/her understanding and acceptance.   Dental advisory given  Plan Discussed with: CRNA  Anesthesia Plan Comments:         Anesthesia Quick Evaluation

## 2014-03-05 NOTE — Anesthesia Postprocedure Evaluation (Signed)
  Anesthesia Post-op Note  Patient: Debra Graham  Procedure(s) Performed: Procedure(s) (LRB): COLONOSCOPY (N/A)  Patient Location: PACU  Anesthesia Type: MAC  Level of Consciousness: awake and alert   Airway and Oxygen Therapy: Patient Spontanous Breathing  Post-op Pain: mild  Post-op Assessment: Post-op Vital signs reviewed, Patient's Cardiovascular Status Stable, Respiratory Function Stable, Patent Airway and No signs of Nausea or vomiting  Last Vitals:  Filed Vitals:   03/05/14 1310  BP: 173/102  Temp:   Resp: 19    Post-op Vital Signs: stable   Complications: No apparent anesthesia complications

## 2014-03-05 NOTE — Discharge Instructions (Signed)
Colonoscopy, Care After °Refer to this sheet in the next few weeks. These instructions provide you with information on caring for yourself after your procedure. Your health care provider may also give you more specific instructions. Your treatment has been planned according to current medical practices, but problems sometimes occur. Call your health care provider if you have any problems or questions after your procedure. °WHAT TO EXPECT AFTER THE PROCEDURE  °After your procedure, it is typical to have the following: °· A small amount of blood in your stool. °· Moderate amounts of gas and mild abdominal cramping or bloating. °HOME CARE INSTRUCTIONS °· Do not drive, operate machinery, or sign important documents for 24 hours. °· You may shower and resume your regular physical activities, but move at a slower pace for the first 24 hours. °· Take frequent rest periods for the first 24 hours. °· Walk around or put a warm pack on your abdomen to help reduce abdominal cramping and bloating. °· Drink enough fluids to keep your urine clear or pale yellow. °· You may resume your normal diet as instructed by your health care provider. Avoid heavy or fried foods that are hard to digest. °· Avoid drinking alcohol for 24 hours or as instructed by your health care provider. °· Only take over-the-counter or prescription medicines as directed by your health care provider. °· If a tissue sample (biopsy) was taken during your procedure: °· Do not take aspirin or blood thinners for 7 days, or as instructed by your health care provider. °· Do not drink alcohol for 7 days, or as instructed by your health care provider. °· Eat soft foods for the first 24 hours. °SEEK MEDICAL CARE IF: °You have persistent spotting of blood in your stool 2 3 days after the procedure. °SEEK IMMEDIATE MEDICAL CARE IF: °· You have more than a small spotting of blood in your stool. °· You pass large blood clots in your stool. °· Your abdomen is swollen  (distended). °· You have nausea or vomiting. °· You have a fever. °· You have increasing abdominal pain that is not relieved with medicine. °Document Released: 06/19/2004 Document Revised: 08/26/2013 Document Reviewed: 07/13/2013 °ExitCare® Patient Information ©2014 [employer]. ° °

## 2014-03-05 NOTE — Progress Notes (Signed)
Patient's son to pick up for discharge. Unable to reach son x 2 hrs.

## 2014-03-08 ENCOUNTER — Ambulatory Visit (INDEPENDENT_AMBULATORY_CARE_PROVIDER_SITE_OTHER): Payer: Medicare Other | Admitting: Internal Medicine

## 2014-03-08 ENCOUNTER — Encounter (HOSPITAL_COMMUNITY): Payer: Self-pay | Admitting: Gastroenterology

## 2014-03-08 VITALS — BP 160/91 | HR 65 | Temp 99.8°F | Ht 66.0 in | Wt 237.3 lb

## 2014-03-08 DIAGNOSIS — I5022 Chronic systolic (congestive) heart failure: Secondary | ICD-10-CM

## 2014-03-08 DIAGNOSIS — E785 Hyperlipidemia, unspecified: Secondary | ICD-10-CM

## 2014-03-08 DIAGNOSIS — E119 Type 2 diabetes mellitus without complications: Secondary | ICD-10-CM

## 2014-03-08 DIAGNOSIS — F3289 Other specified depressive episodes: Secondary | ICD-10-CM

## 2014-03-08 DIAGNOSIS — G479 Sleep disorder, unspecified: Secondary | ICD-10-CM | POA: Diagnosis not present

## 2014-03-08 DIAGNOSIS — I10 Essential (primary) hypertension: Secondary | ICD-10-CM | POA: Diagnosis not present

## 2014-03-08 DIAGNOSIS — F329 Major depressive disorder, single episode, unspecified: Secondary | ICD-10-CM

## 2014-03-08 LAB — GLUCOSE, CAPILLARY: Glucose-Capillary: 206 mg/dL — ABNORMAL HIGH (ref 70–99)

## 2014-03-08 MED ORDER — SIMVASTATIN 40 MG PO TABS: 20.0000 mg | ORAL_TABLET | Freq: Every day | ORAL | Status: DC

## 2014-03-08 MED ORDER — TRAZODONE HCL 50 MG PO TABS: 50.0000 mg | ORAL_TABLET | Freq: Every evening | ORAL | Status: DC | PRN

## 2014-03-08 NOTE — Progress Notes (Signed)
   Subjective:    Patient ID: Debra Graham, female    DOB: 03-14-1956, 58 y.o.   MRN: [id number]  HPI  58 yo with hx significant for poorly controlled DM Type 2, hypertension, depression, and chronic systolic heart failure.  She was recently evaluated a few weeks ago but declined foot exam at that time. Returns today for diabetic foot exam. Continues to have multiple stressors revolving around her son, financial constraints and lack of transportation.  Review of Systems  Constitutional: Negative.   HENT: Negative.   Eyes: Positive for visual disturbance.  Respiratory: Positive for shortness of breath.   Cardiovascular: Negative for chest pain, palpitations and leg swelling.  Gastrointestinal: Negative.   Genitourinary: Negative.   Musculoskeletal: Positive for gait problem. Negative for back pain and myalgias.  Skin: Negative.   Neurological: Negative.   Psychiatric/Behavioral: Positive for dysphoric mood. Negative for suicidal ideas.       Objective:   Physical Exam  Constitutional: She is oriented to person, place, and time. She appears well-developed and well-nourished. No distress.  HENT:  Head: Normocephalic and atraumatic.  Eyes: Conjunctivae and EOM are normal. Pupils are equal, round, and reactive to light.  Neck: Normal range of motion. Neck supple. No thyromegaly present.  Cardiovascular: Normal rate, regular rhythm and intact distal pulses.   Murmur heard. Pulmonary/Chest: Effort normal and breath sounds normal.  Abdominal: Soft. Bowel sounds are normal. She exhibits no distension. There is no tenderness.  Obese.  Musculoskeletal: Normal range of motion. She exhibits no edema and no tenderness.  Neurological: She is alert and oriented to person, place, and time.  Skin: Skin is warm and dry.  Psychiatric: She has a normal mood and affect. Her behavior is normal. Judgment and thought content normal.          Assessment & Plan:  See detailed problem-list  charting for full details.  Hypertension: hasnt has medicines today, bp improved since prior visit but still above goal, reassess at f/u, no changes today  DM: on Lantus 14 units qd -foot check today  Hyperlipidemia: above goal on simvastatin 20 mg -will need change to different statin as further titration limited by current amlodipine therapy

## 2014-03-08 NOTE — Progress Notes (Signed)
Case discussed with Dr. Michail Sermon at the time of the visit.  We reviewed the resident's history and exam and pertinent patient test results.  I agree with the assessment, diagnosis and plan of care documented in the resident's note.

## 2014-03-08 NOTE — Assessment & Plan Note (Signed)
Has not been to [facility] yet secondary to transportation issues. Pt arranging for transportation while in exam room today. Continue seroquel and trazadone.

## 2014-03-08 NOTE — Assessment & Plan Note (Signed)
BP Readings from Last 3 Encounters:  03/08/14 160/91  03/05/14 188/71  03/05/14 188/71    Lab Results  Component Value Date   NA 142 02/01/2014   K 4.1 02/01/2014   CREATININE 0.76 02/01/2014    Assessment: Blood pressure control: moderately elevated Progress toward BP goal:  improved Comments: has not had medications today  Plan: Medications:  continue current medications Educational resources provided: brochure Self management tools provided: home blood pressure logbook Other plans: re-assess at next visit need for additional agent, bp today 160/91 pulse 65 bpm which is improved

## 2014-03-08 NOTE — Assessment & Plan Note (Signed)
Above goal on simvastatin 20 mg.   Pt on amlodipine 10 mg concurrently which likely needs to be continued thus contraindication to increasing simvastatin dose. -Discussed with patient re: costs of other agents.  Ideally would like high-dose atorvastatin 80 mg as she would not be able to afford Crestor. Pt with financial constraints and would like to continue discussion at next follow-up thus will defer change for now.  Of note, I do question compliance with current therapy.

## 2014-03-08 NOTE — Assessment & Plan Note (Signed)
Lab Results  Component Value Date   HGBA1C 8.3 02/01/2014   HGBA1C 7.8 01/26/2013   HGBA1C 8.2 09/22/2012     Assessment: Diabetes control: poor control (HgbA1C >9%) Progress toward A1C goal:  unable to assess Comments: presents for foot exam today which was deferred at last visit  Plan: Medications:  continue current medications Home glucose monitoring: Frequency: 3 times a day Timing: before meals Instruction/counseling given: reminded to bring medications to each visit and discussed foot care Educational resources provided: brochure Self management tools provided:   Other plans: foot exam, reports compliance with Lantus 14 units qd -requesting home health aide and has brought paperwork to be faxed

## 2014-03-08 NOTE — Assessment & Plan Note (Signed)
States that sleep pattern has worsened since stopping trazodone.  Would like to resume.

## 2014-03-08 NOTE — Assessment & Plan Note (Signed)
Without exacerbation today. Continue carvedilol, lasix and spironolactone as well as other antihypertensives.

## 2014-03-08 NOTE — Patient Instructions (Addendum)
General Instructions:  Today we completed the diabetic foot exam. You cholesterol is still elevated. We will need to change your cholesterol medication. We will contact you with instructions. Your blood pressure has improved but is still elevated today. Continue to take all of your medications.   Try to arrange transportation with the help of our staff. Follow-up with [facility] as we discussed. We will complete the paperwork that you brought for [place].  Treatment Goals:  Goals (1 Years of Data) as of 03/08/14         As of Today 03/05/14 03/05/14 03/05/14 03/05/14     Blood Pressure    . Blood Pressure < 140/90  160/91 188/71 173/102 182/102 213/104     Lifestyle    . Prevent Falls           Result Component    . HEMOGLOBIN A1C < 7.0          . LDL CALC < 100            Progress Toward Treatment Goals:  Treatment Goal 03/08/2014  Hemoglobin A1C unable to assess  Blood pressure improved  Stop smoking smoking the same amount  Prevent falls at goal    Self Care Goals & Plans:  Self Care Goal 03/08/2014  Manage my medications take my medicines as prescribed; bring my medications to every visit; refill my medications on time  Monitor my health -  Eat healthy foods drink diet soda or water instead of juice or soda; eat more vegetables; eat foods that are low in salt; eat baked foods instead of fried foods; eat fruit for snacks and desserts  Stop smoking -    Home Blood Glucose Monitoring 03/08/2014  Check my blood sugar 3 times a day  When to check my blood sugar before meals     Care Management & Community Referrals:  Referral 03/08/2014  Referrals made for care management support other (see comment)

## 2014-03-17 ENCOUNTER — Encounter: Payer: Self-pay | Admitting: *Deleted

## 2014-03-17 ENCOUNTER — Ambulatory Visit: Payer: Medicare Other | Admitting: Hematology and Oncology

## 2014-03-17 ENCOUNTER — Other Ambulatory Visit: Payer: Medicare Other

## 2014-03-22 DIAGNOSIS — H2510 Age-related nuclear cataract, unspecified eye: Secondary | ICD-10-CM | POA: Diagnosis not present

## 2014-03-22 DIAGNOSIS — E11359 Type 2 diabetes mellitus with proliferative diabetic retinopathy without macular edema: Secondary | ICD-10-CM | POA: Diagnosis not present

## 2014-03-22 DIAGNOSIS — E119 Type 2 diabetes mellitus without complications: Secondary | ICD-10-CM | POA: Diagnosis not present

## 2014-03-22 DIAGNOSIS — E1139 Type 2 diabetes mellitus with other diabetic ophthalmic complication: Secondary | ICD-10-CM | POA: Diagnosis not present

## 2014-04-05 ENCOUNTER — Encounter: Payer: Self-pay | Admitting: Licensed Clinical Social Worker

## 2014-04-05 NOTE — Progress Notes (Signed)
Patient ID: Debra Graham, female   DOB: 1956/07/06, 58 y.o.   MRN: [id number] Pt inquiring about PCS request.  PCS request not scanned in chart.  Call placed to [facility] and they have not received request.  CSW initiated [facility] request and placed in PCP mailbox for review.

## 2014-04-08 ENCOUNTER — Ambulatory Visit: Payer: Medicare Other | Admitting: Cardiology

## 2014-04-21 ENCOUNTER — Other Ambulatory Visit: Payer: Self-pay | Admitting: Internal Medicine

## 2014-04-28 ENCOUNTER — Encounter: Payer: Self-pay | Admitting: Cardiology

## 2014-05-10 ENCOUNTER — Ambulatory Visit: Payer: Medicare Other | Admitting: Internal Medicine

## 2014-06-09 ENCOUNTER — Other Ambulatory Visit: Payer: Self-pay | Admitting: *Deleted

## 2014-06-09 MED ORDER — LISINOPRIL 40 MG PO TABS: 40.0000 mg | ORAL_TABLET | Freq: Every day | ORAL | Status: DC

## 2014-06-09 MED ORDER — SPIRONOLACTONE 25 MG PO TABS: 12.5000 mg | ORAL_TABLET | Freq: Every day | ORAL | Status: DC

## 2014-06-09 MED ORDER — TRAMADOL HCL 50 MG PO TABS: 50.0000 mg | ORAL_TABLET | Freq: Four times a day (QID) | ORAL | Status: DC | PRN

## 2014-06-09 NOTE — Telephone Encounter (Signed)
Called to pharm 

## 2014-08-17 ENCOUNTER — Encounter: Payer: Self-pay | Admitting: *Deleted

## 2014-08-17 ENCOUNTER — Other Ambulatory Visit: Payer: Self-pay | Admitting: *Deleted

## 2014-08-17 MED ORDER — FUROSEMIDE 40 MG PO TABS: 40.0000 mg | ORAL_TABLET | Freq: Two times a day (BID) | ORAL | Status: DC

## 2014-08-17 MED ORDER — TRAMADOL HCL 50 MG PO TABS: 50.0000 mg | ORAL_TABLET | Freq: Four times a day (QID) | ORAL | Status: DC | PRN

## 2014-08-17 MED ORDER — NITROGLYCERIN 0.4 MG SL SUBL: 0.4000 mg | SUBLINGUAL_TABLET | SUBLINGUAL | Status: DC | PRN

## 2014-10-13 ENCOUNTER — Ambulatory Visit (INDEPENDENT_AMBULATORY_CARE_PROVIDER_SITE_OTHER): Payer: Medicare Other | Admitting: Internal Medicine

## 2014-10-13 ENCOUNTER — Encounter: Payer: Self-pay | Admitting: Internal Medicine

## 2014-10-13 VITALS — BP 160/87 | HR 77 | Temp 99.0°F | Ht 66.0 in | Wt 234.5 lb

## 2014-10-13 DIAGNOSIS — E119 Type 2 diabetes mellitus without complications: Secondary | ICD-10-CM | POA: Diagnosis not present

## 2014-10-13 DIAGNOSIS — I5022 Chronic systolic (congestive) heart failure: Secondary | ICD-10-CM | POA: Diagnosis not present

## 2014-10-13 DIAGNOSIS — Z0000 Encounter for general adult medical examination without abnormal findings: Secondary | ICD-10-CM | POA: Diagnosis not present

## 2014-10-13 DIAGNOSIS — I10 Essential (primary) hypertension: Secondary | ICD-10-CM

## 2014-10-13 LAB — GLUCOSE, CAPILLARY: Glucose-Capillary: 128 mg/dL — ABNORMAL HIGH (ref 70–99)

## 2014-10-13 LAB — POCT GLYCOSYLATED HEMOGLOBIN (HGB A1C): Hemoglobin A1C: 7.5

## 2014-10-13 MED ORDER — METFORMIN HCL 1000 MG PO TABS: 1000.0000 mg | ORAL_TABLET | Freq: Two times a day (BID) | ORAL | Status: DC

## 2014-10-13 NOTE — Progress Notes (Signed)
   Subjective:    Patient ID: Debra Graham, female    DOB: 20-Jul-1956, 58 y.o.   MRN: [id number]  HPI  Patient is a 58 year old female with history of congestive heart failure, type 2 diabetes, hypertension, hyperlipidemia, depression, colon cancer status post colectomy who presents for a routine follow-up.  Please refer to separate problem-list charting for more details.  Review of Systems  Constitutional: Negative for fever and chills.  HENT: Negative for rhinorrhea and sore throat.   Eyes: Negative for visual disturbance.  Respiratory: Negative for cough and shortness of breath.   Cardiovascular: Negative for chest pain and palpitations.  Gastrointestinal: Negative for nausea, vomiting, abdominal pain, diarrhea, constipation and blood in stool.  Genitourinary: Negative for dysuria and hematuria.  Neurological: Negative for syncope.      Objective:   Physical Exam  Constitutional: She is oriented to person, place, and time. She appears well-developed and well-nourished. No distress.  HENT:  Head: Normocephalic and atraumatic.  Eyes: EOM are normal. Pupils are equal, round, and reactive to light. Left eye exhibits no discharge.  Neck: Normal range of motion. Neck supple. No thyromegaly present.  Cardiovascular: Normal rate and regular rhythm.  Exam reveals no gallop and no friction rub.   No murmur heard. Pulmonary/Chest: Effort normal and breath sounds normal. No respiratory distress. She has no wheezes. She has no rales.  Abdominal: Soft. Bowel sounds are normal. She exhibits no distension. There is no tenderness. There is no rebound.  Musculoskeletal: She exhibits no edema.  Neurological: She is alert and oriented to person, place, and time. No cranial nerve deficit.  Skin: Skin is warm and dry. No rash noted.  Psychiatric: She has a normal mood and affect. Thought content normal.      Assessment & Plan:  Please refer to separate problem-list charting for more  details.

## 2014-10-13 NOTE — Assessment & Plan Note (Addendum)
No clinical signs of fluid overload on examination and patient is close to her baseline weight.  - Continue with Coreg 7.5 mg twice a day, Lasix 40 mg twice a day, lisinopril 40 mg twice a day, and sprinolactone 12.5 mg

## 2014-10-13 NOTE — Patient Instructions (Signed)
Please increase your metformin from 500 mg twice a day to 1000 mg twice a day. Please also take your amlodipine in addition to your other blood pressure medications. We will see you back in clinic in a month to arrange for a blood glucose meter and discuss a flu shot again.   General Instructions:   Please bring your medicines with you each time you come to clinic.  Medicines may include prescription medications, over-the-counter medications, herbal remedies, eye drops, vitamins, or other pills.   Progress Toward Treatment Goals:  Treatment Goal 03/08/2014  Hemoglobin A1C unable to assess  Blood pressure improved  Stop smoking smoking the same amount  Prevent falls at goal    Self Care Goals & Plans:  Self Care Goal 10/13/2014  Manage my medications take my medicines as prescribed; bring my medications to every visit; refill my medications on time  Monitor my health bring my glucose meter and log to each visit; keep track of my blood glucose  Eat healthy foods eat more vegetables; eat foods that are low in salt  Be physically active find an activity I enjoy; take a walk every day  Stop smoking set a quit date and stop smoking; cut down the number of cigarettes smoked    Home Blood Glucose Monitoring 03/08/2014  Check my blood sugar 3 times a day  When to check my blood sugar before meals     Care Management & Community Referrals:  Referral 03/08/2014  Referrals made for care management support other (see comment)

## 2014-10-13 NOTE — Assessment & Plan Note (Addendum)
BP Readings from Last 3 Encounters:  10/13/14 160/87  03/08/14 160/91  03/05/14 188/71    Lab Results  Component Value Date   NA 142 02/01/2014   K 4.1 02/01/2014   CREATININE 0.76 02/01/2014    Assessment: Blood pressure control: moderately elevated Progress toward BP goal:  unchanged Comments: Blood pressure moderately elevated today at 160/87. Patient states that she has not been taking her amlodipine because the pharmacy did not dispense it to her. Patient was inquiring about the purpose of the amlodipine, not knowing the indication for which it was prescribed. She is otherwise compliant with her Coreg 7.5 mg, Lasix 40 mg twice a day, lisinopril 40 mg twice a day, and spironolactone 12.5 mg.   Plan: Medications:  Educate patient about the purpose of the amlodipine and the importance of it for her blood pressure control. Patient states that she will try to obtain the medication from the pharmacy as she is about to pick up her medications soon. Otherwise, will make no other changes to her regimen at this time pending observation of her response to resumption of amlodipine.  Other plans: Follow-up in one month.

## 2014-10-13 NOTE — Assessment & Plan Note (Signed)
Patient declining flu shot today because of previous reactions the shot and time limitations here in clinic today.  - Will revisit in one month at follow-up.

## 2014-10-13 NOTE — Assessment & Plan Note (Signed)
Lab Results  Component Value Date   HGBA1C 7.5 10/13/2014   HGBA1C 8.3 02/01/2014   HGBA1C 7.8 01/26/2013     Assessment: Diabetes control: fair control Progress toward A1C goal:  improved Comments:  Patient states that she has been compliant with her Lantus 14 units at night and metformin 500 twice a day. Patient states that she has not been able to measure her blood glucose at home because her monitor is broken. Patient was inquiring about a commercial that she saw on television that claim that she could measure her blood glucose without any finger sticking. She also states that she has had some episodes where she felt a little faint and diaphoretic, though she did not have a monitor to measure her blood glucose at these times.  Plan: Medications:  Increase metformin from 500 mg twice a day to 1000 mg twice a day. Keep Lantus dosage at 14 units daily at bedtime.  Other plans: As follow-up in one month to ensure that the patient is set up with a blood glucose monitor. Patient was declining further help with this today in clinic because of time limitations. Other: I told the patient that to the best my knowledge, there is no commercially available device that is able to measure blood glucose levels noninvasively. I encouraged her to try to obtain a traditional blood glucose monitoring kit, and she is okay with this plan but due to time restrictions today, she wishes to obtain this equipment at her next visit. I have recommended a follow-up in one month for her.

## 2014-10-15 NOTE — Progress Notes (Signed)
Internal Medicine Clinic Attending  I saw and evaluated the patient.  I personally confirmed the key portions of the history and exam documented by Dr. Raelene Bott and I reviewed pertinent patient test results.  The assessment, diagnosis, and plan were formulated together and I agree with the documentation in the resident's note.

## 2014-10-21 ENCOUNTER — Other Ambulatory Visit: Payer: Self-pay | Admitting: Internal Medicine

## 2014-10-26 ENCOUNTER — Other Ambulatory Visit: Payer: Self-pay | Admitting: Internal Medicine

## 2014-10-28 ENCOUNTER — Encounter (HOSPITAL_COMMUNITY): Payer: Self-pay | Admitting: Cardiology

## 2014-11-01 NOTE — Telephone Encounter (Signed)
Rx called in to pharmacy. Pt aware. Hilda Blades Karmina Zufall RN 11/01/14 4PM

## 2014-11-04 ENCOUNTER — Encounter: Payer: Self-pay | Admitting: *Deleted

## 2014-11-08 ENCOUNTER — Encounter: Payer: Medicare Other | Admitting: Internal Medicine

## 2014-11-22 ENCOUNTER — Other Ambulatory Visit: Payer: Self-pay | Admitting: *Deleted

## 2014-11-24 MED ORDER — CARVEDILOL 6.25 MG PO TABS: 6.2500 mg | ORAL_TABLET | Freq: Two times a day (BID) | ORAL | Status: DC

## 2014-12-30 ENCOUNTER — Telehealth: Payer: Self-pay | Admitting: Internal Medicine

## 2014-12-30 NOTE — Telephone Encounter (Signed)
Call to patient to confirm appointment for 01/03/15 at 1:45. lmtcb

## 2015-01-03 ENCOUNTER — Encounter: Payer: Medicare Other | Admitting: Internal Medicine

## 2015-01-13 ENCOUNTER — Telehealth: Payer: Self-pay | Admitting: Internal Medicine

## 2015-01-13 NOTE — Telephone Encounter (Signed)
Call to patient to confirm appointment for 01/17/15 at 1:15 lmtcb

## 2015-01-17 ENCOUNTER — Encounter: Payer: Self-pay | Admitting: Internal Medicine

## 2015-01-17 ENCOUNTER — Ambulatory Visit (INDEPENDENT_AMBULATORY_CARE_PROVIDER_SITE_OTHER): Payer: Medicare Other | Admitting: Internal Medicine

## 2015-01-17 VITALS — BP 132/77 | HR 65 | Temp 98.6°F | Ht 66.5 in | Wt 240.2 lb

## 2015-01-17 DIAGNOSIS — F329 Major depressive disorder, single episode, unspecified: Secondary | ICD-10-CM

## 2015-01-17 DIAGNOSIS — Z0000 Encounter for general adult medical examination without abnormal findings: Secondary | ICD-10-CM

## 2015-01-17 DIAGNOSIS — F32A Depression, unspecified: Secondary | ICD-10-CM

## 2015-01-17 DIAGNOSIS — E785 Hyperlipidemia, unspecified: Secondary | ICD-10-CM | POA: Diagnosis not present

## 2015-01-17 DIAGNOSIS — Z794 Long term (current) use of insulin: Secondary | ICD-10-CM | POA: Diagnosis not present

## 2015-01-17 DIAGNOSIS — I5022 Chronic systolic (congestive) heart failure: Secondary | ICD-10-CM | POA: Diagnosis not present

## 2015-01-17 DIAGNOSIS — E119 Type 2 diabetes mellitus without complications: Secondary | ICD-10-CM | POA: Diagnosis not present

## 2015-01-17 DIAGNOSIS — I10 Essential (primary) hypertension: Secondary | ICD-10-CM

## 2015-01-17 LAB — POCT GLYCOSYLATED HEMOGLOBIN (HGB A1C): Hemoglobin A1C: 7.4

## 2015-01-17 LAB — GLUCOSE, CAPILLARY: Glucose-Capillary: 163 mg/dL — ABNORMAL HIGH (ref 70–99)

## 2015-01-17 MED ORDER — IBUPROFEN 600 MG PO TABS: 600.0000 mg | ORAL_TABLET | Freq: Three times a day (TID) | ORAL | Status: DC

## 2015-01-17 NOTE — Patient Instructions (Addendum)
Please try to follow-up with [facility] for further management of your depression. Please continue taking your Cymbalta and trazodone for this.  Regarding your shoulder pain, it sounds like he may have shoulder tendinitis. I have prescribed ibuprofen 600 mg to help with her symptoms. He may take this along with her tramadol.  Please get back in touch with cardiology regarding follow-up appointments for your heart.  Please try to obtain a blood glucose monitoring kit and monitor your blood sugar levels at home. They're doing a good job of managing your diabetes and hypertension.  General Instructions:   Please bring your medicines with you each time you come to clinic.  Medicines may include prescription medications, over-the-counter medications, herbal remedies, eye drops, vitamins, or other pills.   Progress Toward Treatment Goals:  Treatment Goal 01/17/2015  Hemoglobin A1C at goal  Blood pressure at goal  Stop smoking -  Prevent falls -    Self Care Goals & Plans:  Self Care Goal 01/17/2015  Manage my medications take my medicines as prescribed; bring my medications to every visit; refill my medications on time  Monitor my health -  Eat healthy foods drink diet soda or water instead of juice or soda; eat more vegetables; eat foods that are low in salt; eat baked foods instead of fried foods; eat fruit for snacks and desserts  Be physically active -  Stop smoking -    Home Blood Glucose Monitoring 03/08/2014  Check my blood sugar 3 times a day  When to check my blood sugar before meals     Care Management & Community Referrals:  Referral 03/08/2014  Referrals made for care management support other (see comment)

## 2015-01-17 NOTE — Assessment & Plan Note (Signed)
BP Readings from Last 3 Encounters:  01/17/15 132/77  10/13/14 160/87  03/08/14 160/91    Lab Results  Component Value Date   NA 142 02/01/2014   K 4.1 02/01/2014   CREATININE 0.76 02/01/2014    Assessment: Blood pressure control: controlled Progress toward BP goal:  at goal Comments: Patient states that she is compliant with her amlodipine 10 mg daily, Coreg 6.25 mg twice a day, furosemide 40 mg twice a day, lisinopril 40 mg daily, spironolactone 12.5 mg daily.  Plan: Medications:  continue current medications Educational resources provided:   Self management tools provided:   Other plans: Basic metabolic panel today.

## 2015-01-17 NOTE — Progress Notes (Signed)
   Subjective:    Patient ID: Debra Graham, female    DOB: Oct 21, 1956, 59 y.o.   MRN: [id number]  HPI  Patient is a 59 year old with history of type 2 diabetes, hypertension, hyperlipidemia, depression, systolic heart failure, colon cancer status post sub-total colectomy in 2009 who presents to clinic for a routine follow-up.  Please refer to separate problem-list charting for more details.   Review of Systems  Constitutional: Negative for fever and chills.  HENT: Negative for rhinorrhea and sore throat.   Eyes: Negative for visual disturbance.  Respiratory: Negative for cough and shortness of breath.   Cardiovascular: Negative for chest pain and palpitations.  Gastrointestinal: Negative for nausea, vomiting, abdominal pain, diarrhea, constipation and blood in stool.  Genitourinary: Negative for dysuria and hematuria.  Musculoskeletal:       Right shoulder pain  Neurological: Negative for syncope.  Psychiatric/Behavioral: Positive for dysphoric mood.       Objective:   Physical Exam  Constitutional: She is oriented to person, place, and time. She appears well-developed and well-nourished. No distress.  HENT:  Head: Normocephalic and atraumatic.  Eyes: EOM are normal. Pupils are equal, round, and reactive to light. Left eye exhibits no discharge.  Neck: Normal range of motion. Neck supple. No thyromegaly present.  Cardiovascular: Normal rate and regular rhythm.  Exam reveals no gallop and no friction rub.   No murmur heard. Pulmonary/Chest: Effort normal and breath sounds normal. No respiratory distress. She has no wheezes. She has no rales.  Abdominal: Soft. Bowel sounds are normal. She exhibits no distension. There is no tenderness. There is no rebound.  Musculoskeletal: She exhibits no edema.  Neurological: She is alert and oriented to person, place, and time. No cranial nerve deficit.  Skin: Skin is warm and dry. No rash noted.  Psychiatric: She has a normal mood and  affect. Thought content normal.          Assessment & Plan:  Please refer to separate problem-list charting for more details.

## 2015-01-17 NOTE — Assessment & Plan Note (Signed)
Flu shot administered today. 

## 2015-01-17 NOTE — Assessment & Plan Note (Signed)
Past lipid panel from nearly 1 year ago with an elevated cholesterol of 267 and elevated LDL of 146. -Repeat lipid profile today.

## 2015-01-17 NOTE — Assessment & Plan Note (Signed)
Lab Results  Component Value Date   HGBA1C 7.4 01/17/2015   HGBA1C 7.5 10/13/2014   HGBA1C 8.3 02/01/2014     Assessment: Diabetes control: fair control Progress toward A1C goal:  at goal Comments: Patient states that she is compliant with her Lantus 14 units daily at bedtime and metformin 1000 mg twice a day.  Plan: Medications:  continue current medications Home glucose monitoring: Frequency:   Timing:   Instruction/counseling given: reminded to bring blood glucose meter & log to each visit and reminded to bring medications to each visit Educational resources provided: brochure (denies) Self management tools provided:   Other plans: Patient is deferring talking to [employer]  at this time given time restraints. She states that she wishes to do so at her next visit.

## 2015-01-17 NOTE — Assessment & Plan Note (Signed)
Patient notes that she occasionally has some chest pressure but it is usually alleviated with belching and Alka-Seltzer. She has a history of coronary artery disease as well as ischemic cardiomyopathy. Her last echocardiogram is from 2013. Although she is due for a repeat echocardiogram, patient is deferring on that study given transportation issues and prioritization with other things she must accomplish. -Patient stated that she would reconnect with cardiology clinic given her new transportation arrangements. -Continue carvedilol 6.25 mg twice a day, furosemide 40 mg twice a day, lisinopril 40 mg daily, and spironolactone 12.5 mg daily. -Repeat basic metabolic panel today.

## 2015-01-17 NOTE — Assessment & Plan Note (Signed)
Patient states that her mood has been a little more depressed lately. She has some remote passive suicidal ideation from several months ago. However, she states that she has been feeling better lately. She states that she is going through a transition period in her life and will be moving in with her son soon. Overall, this will be a better arrangement as he will be able to provide her transportation to many of her appointments. Patient denies any episodes consistent with mania recently. Patient is noted to be on Seroquel according to the system but she denies taking any recently. -Continue Cymbalta 60 mg daily and trazodone 50 mg daily at bedtime. -Encourage the patient to go to [facility] for psychotherapy given that she now has transportation

## 2015-01-18 ENCOUNTER — Other Ambulatory Visit: Payer: Self-pay

## 2015-01-18 ENCOUNTER — Emergency Department (HOSPITAL_COMMUNITY)
Admission: EM | Admit: 2015-01-18 | Discharge: 2015-01-19 | Disposition: A | Discharge status: Home / Self Care | Payer: Medicare Other | Attending: Emergency Medicine | Admitting: Emergency Medicine

## 2015-01-18 ENCOUNTER — Encounter (HOSPITAL_COMMUNITY): Payer: Self-pay | Admitting: Emergency Medicine

## 2015-01-18 ENCOUNTER — Telehealth: Payer: Self-pay | Admitting: Internal Medicine

## 2015-01-18 ENCOUNTER — Emergency Department (HOSPITAL_COMMUNITY): Payer: Medicare Other

## 2015-01-18 DIAGNOSIS — I5022 Chronic systolic (congestive) heart failure: Secondary | ICD-10-CM | POA: Diagnosis not present

## 2015-01-18 DIAGNOSIS — Z79899 Other long term (current) drug therapy: Secondary | ICD-10-CM | POA: Diagnosis not present

## 2015-01-18 DIAGNOSIS — Z8739 Personal history of other diseases of the musculoskeletal system and connective tissue: Secondary | ICD-10-CM | POA: Diagnosis not present

## 2015-01-18 DIAGNOSIS — F329 Major depressive disorder, single episode, unspecified: Secondary | ICD-10-CM | POA: Insufficient documentation

## 2015-01-18 DIAGNOSIS — Z720 Tobacco use: Secondary | ICD-10-CM | POA: Diagnosis not present

## 2015-01-18 DIAGNOSIS — E785 Hyperlipidemia, unspecified: Secondary | ICD-10-CM | POA: Insufficient documentation

## 2015-01-18 DIAGNOSIS — E119 Type 2 diabetes mellitus without complications: Secondary | ICD-10-CM | POA: Insufficient documentation

## 2015-01-18 DIAGNOSIS — E875 Hyperkalemia: Secondary | ICD-10-CM | POA: Diagnosis not present

## 2015-01-18 DIAGNOSIS — R600 Localized edema: Secondary | ICD-10-CM | POA: Insufficient documentation

## 2015-01-18 DIAGNOSIS — R7889 Finding of other specified substances, not normally found in blood: Secondary | ICD-10-CM | POA: Diagnosis present

## 2015-01-18 DIAGNOSIS — I10 Essential (primary) hypertension: Secondary | ICD-10-CM | POA: Insufficient documentation

## 2015-01-18 DIAGNOSIS — Z85038 Personal history of other malignant neoplasm of large intestine: Secondary | ICD-10-CM | POA: Diagnosis not present

## 2015-01-18 DIAGNOSIS — G629 Polyneuropathy, unspecified: Secondary | ICD-10-CM | POA: Insufficient documentation

## 2015-01-18 LAB — LIPID PANEL
Cholesterol: 215 mg/dL — ABNORMAL HIGH (ref 0–200)
HDL: 59 mg/dL (ref >=46)
LDL CALC: 109 mg/dL — AB (ref 0–99)
Total CHOL/HDL Ratio: 3.6 Ratio
Triglycerides: 237 mg/dL — ABNORMAL HIGH (ref <150)
VLDL: 47 mg/dL — ABNORMAL HIGH (ref 0–40)

## 2015-01-18 LAB — BASIC METABOLIC PANEL
Anion gap: 8 (ref 5–15)
BUN: 34 mg/dL — AB (ref 6–23)
BUN: 32 mg/dL — ABNORMAL HIGH (ref 6–23)
CALCIUM: 9.3 mg/dL (ref 8.4–10.5)
CO2: 20 mEq/L (ref 19–32)
CO2: 20 mmol/L (ref 19–32)
CREATININE: 1.4 mg/dL — AB (ref 0.50–1.10)
CREATININE: 1.28 mg/dL — AB (ref 0.50–1.10)
Calcium: 9.7 mg/dL (ref 8.4–10.5)
Chloride: 105 mEq/L (ref 96–112)
Chloride: 109 mmol/L (ref 96–112)
GFR calc non Af Amer: 45 mL/min — ABNORMAL LOW (ref >90)
GFR, EST AFRICAN AMERICAN: 52 mL/min — AB (ref >90)
GLUCOSE: 150 mg/dL — AB (ref 70–99)
Glucose, Bld: 128 mg/dL — ABNORMAL HIGH (ref 70–99)
Potassium: 5.9 mEq/L — ABNORMAL HIGH (ref 3.5–5.3)
Potassium: 5.2 mmol/L — ABNORMAL HIGH (ref 3.5–5.1)
Sodium: 135 mEq/L (ref 135–145)
Sodium: 137 mmol/L (ref 135–145)

## 2015-01-18 LAB — BRAIN NATRIURETIC PEPTIDE: B Natriuretic Peptide: 24.1 pg/mL (ref 0.0–100.0)

## 2015-01-18 LAB — I-STAT TROPONIN, ED: Troponin i, poc: 0 ng/mL (ref 0.00–0.08)

## 2015-01-18 LAB — CBC
HCT: 36.7 % (ref 36.0–46.0)
Hemoglobin: 12.4 g/dL (ref 12.0–15.0)
MCH: 30 pg (ref 26.0–34.0)
MCHC: 33.8 g/dL (ref 30.0–36.0)
MCV: 88.6 fL (ref 78.0–100.0)
PLATELETS: 205 10*3/uL (ref 150–400)
RBC: 4.14 MIL/uL (ref 3.87–5.11)
RDW: 12.9 % (ref 11.5–15.5)
WBC: 7.9 10*3/uL (ref 4.0–10.5)

## 2015-01-18 MED ORDER — SODIUM CHLORIDE 0.9 % IV BOLUS (SEPSIS)
500.0000 mL | Freq: Once | INTRAVENOUS | Status: AC
  Administered 2015-01-19: 500 mL via INTRAVENOUS

## 2015-01-18 NOTE — ED Notes (Signed)
Patient sent here by Dr Raelene Bott for abnormal potassium level.  The lab normal was high yesterday per labwork and patient sent for redraw.  Patient has CHF, patient is having some shortness of breath more than usual.

## 2015-01-18 NOTE — Telephone Encounter (Signed)
Patient's basic metabolic panel has returned and the results are as follows: Patient has a hyperkalemia of 5.9 and an elevated creatinine of 1.4. Prior labs within normal limits though from 11 months ago. It is unclear whether this represents an acute or chronic pathology, though patient's potassium level is concerning regardless of chronicity. The lab noted no evidence of hemolysis.  Patient was called and advised to seek immediate attention in the emergency department for repeat testing and an EKG. Patient voiced understanding, though she was uncertain whether she would be able to make it into the ED tonight because of transportation issues. I reemphasized the importance of her seeking prompt medication attention, calling 911 and an ambulance if necessary. Again, she said she would look into making arrangements either tonight or tomorrow.    BMP Latest Ref Rng 01/17/2015 02/01/2014 01/26/2013  Glucose 70 - 99 mg/dL 150(H) 141(H) 150(H)  BUN 6 - 23 mg/dL 34(H) 9 14  Creatinine 0.50 - 1.10 mg/dL 1.40(H) 0.76 0.82  Sodium 135 - 145 mEq/L 135 142 138  Potassium 3.5 - 5.3 mEq/L 5.9(H) 4.1 5.0  Chloride 96 - 112 mEq/L 105 104 106  CO2 19 - 32 mEq/L 20 30 27   Calcium 8.4 - 10.5 mg/dL 9.3 8.9 9.6

## 2015-01-19 DIAGNOSIS — E875 Hyperkalemia: Secondary | ICD-10-CM | POA: Diagnosis not present

## 2015-01-19 LAB — BASIC METABOLIC PANEL
Anion gap: 6 (ref 5–15)
BUN: 32 mg/dL — AB (ref 6–23)
CO2: 20 mmol/L (ref 19–32)
Calcium: 9.3 mg/dL (ref 8.4–10.5)
Chloride: 112 mmol/L (ref 96–112)
Creatinine, Ser: 1.33 mg/dL — ABNORMAL HIGH (ref 0.50–1.10)
GFR calc Af Amer: 50 mL/min — ABNORMAL LOW (ref >90)
GFR, EST NON AFRICAN AMERICAN: 43 mL/min — AB (ref >90)
Glucose, Bld: 163 mg/dL — ABNORMAL HIGH (ref 70–99)
POTASSIUM: 5.2 mmol/L — AB (ref 3.5–5.1)
Sodium: 138 mmol/L (ref 135–145)

## 2015-01-19 MED ORDER — SODIUM POLYSTYRENE SULFONATE 15 GM/60ML PO SUSP
30.0000 g | Freq: Once | ORAL | Status: AC
  Administered 2015-01-19: 30 g via ORAL
  Filled 2015-01-19: qty 120

## 2015-01-19 NOTE — Discharge Instructions (Signed)
Please follow up with your doctor for a recheck of your potassium level.   Hyperkalemia Hyperkalemia is when you have too much potassium in your blood. This can be a life-threatening condition. Potassium is normally removed (excreted) from the body by the kidneys. CAUSES  The potassium level in your body can become too high for the following reasons:  You take in too much potassium. You can do this by:  Using salt substitutes. They contain large amounts of potassium.  Taking potassium supplements from your caregiver. The dose may be too high for you.  Eating foods or taking nutritional products with potassium.  You excrete too little potassium. This can happen if:  Your kidneys are not functioning properly. Kidney (renal) disease is a very common cause of hyperkalemia.  You are taking medicines that lower your excretion of potassium, such as certain diuretic medicines.  You have an adrenal gland disease called Addison's disease.  You have a urinary tract obstruction, such as kidney stones.  You are on treatment to mechanically clean your blood (dialysis) and you skip a treatment.  You release a high amount of potassium from your cells into your blood. You may have a condition that causes potassium to move from your cells to your bloodstream. This can happen with:  Injury to muscles or other tissues. Most potassium is stored in the muscles.  Severe burns or infections.  Acidic blood plasma (acidosis). Acidosis can result from many diseases, such as uncontrolled diabetes. SYMPTOMS  Usually, there are no symptoms unless the potassium is dangerously high or has risen very quickly. Symptoms may include:  Irregular or very slow heartbeat.  Feeling sick to your stomach (nauseous).  Tiredness (fatigue).  Nerve problems such as tingling of the skin, numbness of the hands or feet, weakness, or paralysis. DIAGNOSIS  A simple blood test can measure the amount of potassium in your  body. An electrocardiogram test of the heart can also help make the diagnosis. The heart may beat dangerously fast or slow down and stop beating with severe hyperkalemia.  TREATMENT  Treatment depends on how bad the condition is and on the underlying cause.  If the hyperkalemia is an emergency (causing heart problems or paralysis), many different medicines can be used alone or together to lower the potassium level briefly. This may include an insulin injection even if you are not diabetic. Emergency dialysis may be needed to remove potassium from the body.  If the hyperkalemia is less severe or dangerous, the underlying cause is treated. This can include taking medicines if needed. Your prescription medicines may be changed. You may also need to take a medicine to help your body get rid of potassium. You may need to eat a diet low in potassium. HOME CARE INSTRUCTIONS   Take medicines and supplements as directed by your caregiver.  Do not take any over-the-counter medicines, supplements, natural products, herbs, or vitamins without reviewing them with your caregiver. Certain supplements and natural food products can have high amounts of potassium. Other products (such as ibuprofen) can damage weak kidneys and raise your potassium.  You may be asked to do repeat lab tests. Be sure to follow these directions.  If you have kidney disease, you may need to follow a low potassium diet. SEEK MEDICAL CARE IF:   You notice an irregular or very slow heartbeat.  You feel lightheaded.  You develop weakness that is unusual for you. SEEK IMMEDIATE MEDICAL CARE IF:   You have shortness of breath.  You have chest discomfort.  You pass out (faint). MAKE SURE YOU:   Understand these instructions.  Will watch your condition.  Will get help right away if you are not doing well or get worse. Document Released: 10/26/2002 Document Revised: 01/28/2012 Document Reviewed: 02/10/2014 [facility] Patient  Information 2015 [place], [place]. This information is not intended to replace advice given to you by your health care provider. Make sure you discuss any questions you have with your health care provider. Repeat potassium level was 5.2.  You were given IV fluids, which did not change your potassium level just prior to your discharge home.  You were given a dose of Kayexalate, which does cause some loose stools but it does bring the potassium level down.  Please make an appointment with your primary care provider to have your potassium level checked on Friday.

## 2015-01-19 NOTE — ED Provider Notes (Signed)
CSN: [id number]     Arrival date & time 01/18/15  2038 History   First MD Initiated Contact with Patient 01/18/15 2309     Chief Complaint  Patient presents with  . Abnormal Lab     (Consider location/radiation/quality/duration/timing/severity/associated sxs/prior Treatment) HPI Comments: Patient was sent from her 4 office with a potassium level of 5.9, for redraw.  She does have a history of hypertension, CHF, diabetes.  She states she's not had any change in her medication.  She's not seen her cardiologist in quite some time.  She states she's had intermittent chest pain since October.  She notes some moderate amount of pretibial swelling.  She states she's been taking her Lasix on a regular basis and she does have good results from this  The history is provided by the patient.    Past Medical History  Diagnosis Date  . Osteoarthritis   . Diabetes mellitus   . Hyperlipidemia   . History of cocaine abuse   . Hypertension   . Adenocarcinoma, colon 11/09    s/p subtotal colectomy with primary anastamosis by Dr. Excell Seltzer; Chemotherapy per Dr. Jonette Eva   . patient also endorses chronic intermittent abdominal aching and loose stools a/c with her colon cancer and Sx.   . Carpal tunnel syndrome   . Peripheral neuropathy   . Chronic systolic heart failure     LHC 01/04/12: Luminal irregularities, no significant CAD, elevated LVEDP;  Echocardiogram 01/03/12: Mild LVH, EF 35-40%, moderate LAE.    [name] Depression   . NICM (nonischemic cardiomyopathy)     ? HTN cardiomyopathy   Past Surgical History  Procedure Laterality Date  . Subtotal colectomy with ileosigmoidostomy  10/09  . Colonoscopy N/A 03/05/2014    Procedure: COLONOSCOPY;  Surgeon: [name], MD;  Location: [facility];  Service: Endoscopy;  Laterality: N/A;  . Left heart catheterization with coronary angiogram N/A 01/04/2012    Procedure: LEFT HEART CATHETERIZATION WITH CORONARY ANGIOGRAM;  Surgeon: Larey Dresser, MD;   Location: [facility] CATH LAB;  Service: Cardiovascular;  Laterality: N/A;   Family History  Problem Relation Age of Onset  . Breast cancer Father   . Hypertension Mother   . Heart disease Mother   . Heart attack Brother     All siblings passed away  . Cancer      Mother's side of family   History  Substance Use Topics  . Smoking status: Current Some Day Smoker -- 0.30 packs/day    Types: Cigarettes  . Smokeless tobacco: Not on file     Comment: Seldom finish a whole cigarette.  . Alcohol Use: 0.0 oz/week    0 Standard drinks or equivalent per week     Comment: Wine/beer sometimes.   OB History    No data available     Review of Systems  Constitutional: Negative for fever and fatigue.  HENT: Negative for rhinorrhea.   Eyes: Negative for pain and visual disturbance.  Respiratory: Negative for chest tightness and shortness of breath.   Cardiovascular: Positive for chest pain and leg swelling. Negative for palpitations.  Gastrointestinal: Negative for nausea and vomiting.  Genitourinary: Negative for dysuria, urgency and frequency.  Musculoskeletal: Negative for back pain and joint swelling.  Skin: Negative for rash.  Neurological: Negative for dizziness and weakness.  All other systems reviewed and are negative.     Allergies  Review of patient's allergies indicates no known allergies.  Home Medications   Prior to Admission medications   Medication  Sig Start Date End Date Taking? Authorizing Provider  amLODipine (NORVASC) 10 MG tablet TAKE 1 TABLET BY MOUTH EVERY DAY 10/22/14  Yes Luan Moore, MD  carvedilol (COREG) 6.25 MG tablet Take 1 tablet (6.25 mg total) by mouth 2 (two) times daily with a meal. Patient taking differently: Take 9.375 mg by mouth 2 (two) times daily with a meal.  11/24/14  Yes Luan Moore, MD  DULoxetine (CYMBALTA) 60 MG capsule TAKE ONE CAPSULE BY MOUTH EVERY DAY 11/01/14  Yes Luan Moore, MD  furosemide (LASIX) 40 MG tablet Take 1 tablet (40 mg  total) by mouth 2 (two) times daily. 08/17/14  Yes Nischal Dareen Piano, MD  LANTUS SOLOSTAR 100 UNIT/ML injection INJECT 14 UNITS INTO THE SKIN AS DIRECTED AT BEDTIME 02/17/13  Yes Dorian Heckle, MD  lisinopril (PRINIVIL,ZESTRIL) 40 MG tablet Take 1 tablet (40 mg total) by mouth daily. 06/09/14  Yes Sid Falcon, MD  metFORMIN (GLUCOPHAGE) 1000 MG tablet Take 1 tablet (1,000 mg total) by mouth 2 (two) times daily with a meal. 10/13/14 10/13/15 Yes Luan Moore, MD  nitroGLYCERIN (NITROSTAT) 0.4 MG SL tablet Place 1 tablet (0.4 mg total) under the tongue every 5 (five) minutes as needed for chest pain. 08/17/14  Yes Aldine Contes, MD  spironolactone (ALDACTONE) 25 MG tablet Take 0.5 tablets (12.5 mg total) by mouth daily. Patient taking differently: Take 25 mg by mouth daily.  06/09/14  Yes Sid Falcon, MD  traMADol (ULTRAM) 50 MG tablet TAKE 1 TABLET BY MOUTH EVERY 6 HOURS AS NEEDED 11/01/14  Yes Luan Moore, MD  traZODone (DESYREL) 50 MG tablet Take 1 tablet (50 mg total) by mouth at bedtime as needed. As needed for sleep. 03/08/14  Yes Dorian Heckle, MD  ibuprofen (ADVIL,MOTRIN) 600 MG tablet Take 1 tablet (600 mg total) by mouth every 8 (eight) hours. Take with food. 01/17/15   Luan Moore, MD  simvastatin (ZOCOR) 40 MG tablet Take 0.5 tablets (20 mg total) by mouth daily. Patient not taking: Reported on 01/18/2015 03/08/14   Dorian Heckle, MD   BP 144/67 mmHg  Pulse 73  Resp 12  SpO2 96% Physical Exam  Constitutional: She is oriented to person, place, and time. She appears well-developed and well-nourished.  HENT:  Head: Normocephalic.  Mouth/Throat: Oropharynx is clear and moist.  Eyes: Pupils are equal, round, and reactive to light.  Neck: Normal range of motion.  Cardiovascular: Normal rate and regular rhythm.   Pulmonary/Chest: Effort normal and breath sounds normal. No respiratory distress.  Abdominal: Soft. She exhibits no distension. There is no tenderness.  Musculoskeletal:  Normal range of motion. She exhibits edema. She exhibits no tenderness.       Right lower leg: She exhibits edema. She exhibits no tenderness, no bony tenderness and no swelling.       Left lower leg: She exhibits edema. She exhibits no tenderness, no bony tenderness and no swelling.  Neurological: She is alert and oriented to person, place, and time.  Skin: Skin is warm and dry. No rash noted.    ED Course  Procedures (including critical care time) Labs Review Labs Reviewed  BASIC METABOLIC PANEL - Abnormal; Notable for the following:    Potassium 5.2 (*)    Glucose, Bld 128 (*)    BUN 32 (*)    Creatinine, Ser 1.28 (*)    GFR calc non Af Amer 45 (*)    GFR calc Af Amer 52 (*)    All other components within normal limits  BASIC METABOLIC PANEL - Abnormal; Notable for the following:    Potassium 5.2 (*)    Glucose, Bld 163 (*)    BUN 32 (*)    Creatinine, Ser 1.33 (*)    GFR calc non Af Amer 43 (*)    GFR calc Af Amer 50 (*)    All other components within normal limits  CBC  BRAIN NATRIURETIC PEPTIDE  I-STAT TROPOININ, ED    Imaging Review Dg Chest 2 View  01/18/2015   CLINICAL DATA:  Hyperkalemia.  Initial encounter.  EXAM: CHEST  2 VIEW  COMPARISON:  Chest radiograph performed 01/02/2012, and CT of the chest performed 01/06/2013  FINDINGS: The lungs are well-aerated and clear. There is no evidence of focal opacification, pleural effusion or pneumothorax.  The heart is normal in size; the mediastinal contour is within normal limits. No acute osseous abnormalities are seen.  IMPRESSION: No acute cardiopulmonary process seen.   Electronically Signed   By: Garald Balding M.D.   On: 01/18/2015 21:55     EKG Interpretation   Date/Time:  Tuesday January 18 2015 20:48:05 EST Ventricular Rate:  81 PR Interval:  174 QRS Duration: 114 QT Interval:  372 QTC Calculation: 432 R Axis:   -41 Text Interpretation:  Normal sinus rhythm Left axis deviation Left  ventricular hypertrophy  with repolarization abnormality Abnormal ECG No  significant change since last tracing Confirmed by OTTER  MD, OLGA ([id number])  on 01/18/2015 11:54:10 PM     Patient was given a 500 cc bolus of normal saline and labs rechecked potassium level remains at 5.2 BUN and creatinine remain stable.  She'll be given 30 g of Kayexalate and discharge him follow-up with her primary care physician in 2 days for recheck of her potassium level. MDM   Final diagnoses:  None         Garald Balding, NP 01/19/15 [place], MD 01/19/15 [phone]

## 2015-01-21 NOTE — Progress Notes (Signed)
Internal Medicine Clinic Attending  Case discussed with Dr. Ngo at the time of the visit.  We reviewed the resident's history and exam and pertinent patient test results.  I agree with the assessment, diagnosis, and plan of care documented in the resident's note. 

## 2015-01-24 ENCOUNTER — Other Ambulatory Visit: Payer: Self-pay | Admitting: Dietician

## 2015-01-24 ENCOUNTER — Encounter: Payer: Self-pay | Admitting: Internal Medicine

## 2015-01-24 ENCOUNTER — Ambulatory Visit (INDEPENDENT_AMBULATORY_CARE_PROVIDER_SITE_OTHER): Payer: Medicare Other | Admitting: Internal Medicine

## 2015-01-24 ENCOUNTER — Encounter: Payer: Self-pay | Admitting: Dietician

## 2015-01-24 ENCOUNTER — Ambulatory Visit (INDEPENDENT_AMBULATORY_CARE_PROVIDER_SITE_OTHER): Payer: Medicare Other | Admitting: Dietician

## 2015-01-24 VITALS — BP 137/71 | HR 66 | Temp 98.4°F | Ht 66.0 in | Wt 243.8 lb

## 2015-01-24 DIAGNOSIS — Z9114 Patient's other noncompliance with medication regimen: Secondary | ICD-10-CM | POA: Diagnosis not present

## 2015-01-24 DIAGNOSIS — E119 Type 2 diabetes mellitus without complications: Secondary | ICD-10-CM | POA: Diagnosis not present

## 2015-01-24 DIAGNOSIS — E875 Hyperkalemia: Secondary | ICD-10-CM | POA: Diagnosis not present

## 2015-01-24 DIAGNOSIS — E785 Hyperlipidemia, unspecified: Secondary | ICD-10-CM

## 2015-01-24 DIAGNOSIS — Z0000 Encounter for general adult medical examination without abnormal findings: Secondary | ICD-10-CM

## 2015-01-24 DIAGNOSIS — Z1231 Encounter for screening mammogram for malignant neoplasm of breast: Secondary | ICD-10-CM

## 2015-01-24 DIAGNOSIS — Z713 Dietary counseling and surveillance: Secondary | ICD-10-CM

## 2015-01-24 LAB — BASIC METABOLIC PANEL
Anion gap: 3 — ABNORMAL LOW (ref 5–15)
BUN: 23 mg/dL (ref 6–23)
CO2: 24 mmol/L (ref 19–32)
Calcium: 9.4 mg/dL (ref 8.4–10.5)
Chloride: 109 mmol/L (ref 96–112)
Creatinine, Ser: 1.21 mg/dL — ABNORMAL HIGH (ref 0.50–1.10)
GFR calc Af Amer: 56 mL/min — ABNORMAL LOW (ref >90)
GFR calc non Af Amer: 48 mL/min — ABNORMAL LOW (ref >90)
Glucose, Bld: 172 mg/dL — ABNORMAL HIGH (ref 70–99)
POTASSIUM: 5.1 mmol/L (ref 3.5–5.1)
Sodium: 136 mmol/L (ref 135–145)

## 2015-01-24 LAB — GLUCOSE, CAPILLARY: GLUCOSE-CAPILLARY: 161 mg/dL — AB (ref 70–99)

## 2015-01-24 MED ORDER — INSULIN PEN NEEDLE 32G X 4 MM MISC: Status: AC

## 2015-01-24 MED ORDER — ONETOUCH DELICA LANCETS FINE MISC: Status: DC

## 2015-01-24 MED ORDER — GLUCOSE BLOOD VI STRP: ORAL_STRIP | Status: DC

## 2015-01-24 MED ORDER — SIMVASTATIN 40 MG PO TABS: 40.0000 mg | ORAL_TABLET | Freq: Every day | ORAL | Status: DC

## 2015-01-24 NOTE — Assessment & Plan Note (Signed)
- 

## 2015-01-24 NOTE — Assessment & Plan Note (Signed)
Repeat lipid panel from 2/29 with elevated total cholesterol 215, TG 237, and LDL 109, normal HDL 59. Pt not taking her statin. - Restart simvastatin 40mg  daily

## 2015-01-24 NOTE — Progress Notes (Signed)
Needs pen needles and testing supplies, sample One touch ultra mini given to patient again today. She says she lost her last one and it stopped working. Also need referral to cover today's visit. Thank you!

## 2015-01-24 NOTE — Assessment & Plan Note (Addendum)
K 5.9 and 5.2 on repeat in the ED. IVF and Kayexalate given and K repeated today and is 5.1. Pt is on an ACEi and spironolactone.  - Stopping spironolactone - Continue ACEi for now - Encouraged Cardiology f/u - Return to the clinic in 1-2 weeks for repeat BMP

## 2015-01-24 NOTE — Progress Notes (Signed)
Patient ID: Debra Graham, female   DOB: Jun 10, 1956, 59 y.o.   MRN: [id number]  Subjective:   Patient ID: Debra Graham female   DOB: [birth year] 59 y.o.   MRN: [id number]  HPI: Debra Graham is a 59 y.o.   She was sent to the ED on 3/2 due to hyperkalemia with a potassium of 5.9. Repeat labs with K 5.2. She was given IVF and Kayexalate 30mg  and was asked to f/u with her PCP and Cardiologist. Of note, she is taking Spironolactone.   Pt states that she is feeling good and has no complaints. She is not taking her Lantus but is generally compliant with her metformin. She is not checking CBGs.    Past Medical History  Diagnosis Date  . Osteoarthritis   . Diabetes mellitus   . Hyperlipidemia   . History of cocaine abuse   . Hypertension   . Adenocarcinoma, colon 11/09    s/p subtotal colectomy with primary anastamosis by Dr. Excell Seltzer; Chemotherapy per Dr. Jonette Eva   . patient also endorses chronic intermittent abdominal aching and loose stools a/c with her colon cancer and Sx.   . Carpal tunnel syndrome   . Peripheral neuropathy   . Chronic systolic heart failure     LHC 01/04/12: Luminal irregularities, no significant CAD, elevated LVEDP;  Echocardiogram 01/03/12: Mild LVH, EF 35-40%, moderate LAE.    [name] Depression   . NICM (nonischemic cardiomyopathy)     ? HTN cardiomyopathy   Current Outpatient Prescriptions  Medication Sig Dispense Refill  . amLODipine (NORVASC) 10 MG tablet TAKE 1 TABLET BY MOUTH EVERY DAY 30 tablet 4  . carvedilol (COREG) 6.25 MG tablet Take 1 tablet (6.25 mg total) by mouth 2 (two) times daily with a meal. (Patient taking differently: Take 9.375 mg by mouth 2 (two) times daily with a meal. ) 180 tablet 1  . DULoxetine (CYMBALTA) 60 MG capsule TAKE ONE CAPSULE BY MOUTH EVERY DAY 30 capsule 2  . furosemide (LASIX) 40 MG tablet Take 1 tablet (40 mg total) by mouth 2 (two) times daily. 180 tablet 4  . ibuprofen (ADVIL,MOTRIN) 600 MG tablet Take 1 tablet (600  mg total) by mouth every 8 (eight) hours. Take with food. 90 tablet 0  . LANTUS SOLOSTAR 100 UNIT/ML injection INJECT 14 UNITS INTO THE SKIN AS DIRECTED AT BEDTIME 15 mL 6  . lisinopril (PRINIVIL,ZESTRIL) 40 MG tablet Take 1 tablet (40 mg total) by mouth daily. 90 tablet 4  . metFORMIN (GLUCOPHAGE) 1000 MG tablet Take 1 tablet (1,000 mg total) by mouth 2 (two) times daily with a meal. 60 tablet 11  . nitroGLYCERIN (NITROSTAT) 0.4 MG SL tablet Place 1 tablet (0.4 mg total) under the tongue every 5 (five) minutes as needed for chest pain. 25 tablet 3  . simvastatin (ZOCOR) 40 MG tablet Take 0.5 tablets (20 mg total) by mouth daily. (Patient not taking: Reported on 01/18/2015) 30 tablet 3  . spironolactone (ALDACTONE) 25 MG tablet Take 0.5 tablets (12.5 mg total) by mouth daily. (Patient taking differently: Take 25 mg by mouth daily. ) 45 tablet 4  . traMADol (ULTRAM) 50 MG tablet TAKE 1 TABLET BY MOUTH EVERY 6 HOURS AS NEEDED 90 tablet 0  . traZODone (DESYREL) 50 MG tablet Take 1 tablet (50 mg total) by mouth at bedtime as needed. As needed for sleep. 30 tablet 2   No current facility-administered medications for this visit.   Family History  Problem Relation Age of Onset  .  Breast cancer Father   . Hypertension Mother   . Heart disease Mother   . Heart attack Brother     All siblings passed away  . Cancer      Mother's side of family   History   Social History  . Marital Status: Legally Separated    Spouse Name: N/A  . Number of Children: 3  . Years of Education: 12   Occupational History  . Disabled     Social History Main Topics  . Smoking status: Current Some Day Smoker -- 0.30 packs/day    Types: Cigarettes  . Smokeless tobacco: Not on file     Comment: Seldom finish a whole cigarette.  . Alcohol Use: 0.0 oz/week    0 Standard drinks or equivalent per week     Comment: Wine/beer sometimes.  . Drug Use: No  . Sexual Activity: Not on file   Other Topics Concern  . None    Social History Narrative   Lives in [place], [place] with son and family.    Review of Systems: Constitutional: Denies fever, chills.  HEENT:Denies rhinorrhea, sneezing.   Respiratory: Denies SOB Cardiovascular: Denies chest pain Gastrointestinal: Denies nausea, vomiting, abdominal pain Genitourinary: Denies dysuria.  Neurological: Denies dizziness, syncope.  Psychiatric/Behavioral: Denies suicidal ideation. +dysphoria   Objective:  Physical Exam: Filed Vitals:   01/24/15 0904  BP: 137/71  Pulse: 66  Temp: 98.4 F (36.9 C)  TempSrc: Oral  Height: 5\' 6"  (1.676 m)  Weight: 243 lb 12.8 oz (110.587 kg)  SpO2: 99%   Constitutional: Vital signs reviewed.  Patient is a well-developed and well-nourished female in no acute distress and cooperative with exam. Alert and oriented x3.  Head: Normocephalic and atraumatic Eyes: EOMI. No scleral icterus.   Cardiovascular: RRR Pulmonary/Chest: Normal respiratory effort Abdominal: Soft, non-distended.  Musculoskeletal: No joint deformities Neurological: A&O x3, cranial nerve II-XII are grossly intact, no focal motor deficit.  Skin: Warm, dry and intact.   Psychiatric: Normal mood and affect. Speech and behavior is normal.   Assessment & Plan:   Please refer to Problem List based Assessment and Plan

## 2015-01-24 NOTE — Progress Notes (Signed)
Diabetes Self-Management Education  Visit Type:  annual follow up  Appt. Start Time: 10:00 am Appt. End Time: 10:30 AM  01/24/2015  [name], identified by name and date of birth, is a 59 y.o. female with a diagnosis of Diabetes:  .Type 2 diabetes  Other people present during visit:  Family Member - two  ASSESSMENT  A1C improved: from 7.5 to 7.4 in last few months but decreased from 8.3 last spring, weight went down after CDE visit but back up so in total remained stable.  Vitals taken are in office visit.  weight is decreased 10-20# from 2012.  Patient Belief/Attitude about Diabetes: Motivated to manage diabetes Self-care barriers: Lack of material resources Self-management support: Doctor's office, Family, CDE visits Other persons present: Family Member Special Needs: Simplified materials Preferred Learning Style: No preference indicated   Learning Readiness: Contemplating    Psychosocial:  Patient Belief/Attitude about Diabetes: Motivated to manage diabetes Self-care barriers: Lack of material resources Self-management support: Doctor's office, Family, CDE visits Other persons present: Family Member Special Needs: Simplified materials Preferred Learning Style: No preference indicated Learning Readiness: Contemplating  Complications:   How often do you check your blood sugar?: 0 times/day (not testing)  Diet Intake:   not done today due to limited time  Exercise:   Not discussed today  Individualized Plan for Diabetes Self-Management Training:   Learning Objective:  Patient will have a greater understanding of diabetes self-management.  Patient education plan per assessed needs and concerns is to become competent at self monitoring.      Education Topics Reviewed with Patient Today:   obtained and learned how to use her OneTouch Ultramini meter. Family members [place] learned and seemed supportive.       PLAN:  Monitoring : test my blood glucose as  discussed), bring meter to next visit  Patient Self Evaluation of Goals - Patient rates self as meeting previously set goals:   Nutrition: 25 - 50% ( from her visit last spring)    Expected Outcomes:  Demonstrated interest in learning. Expect positive outcomes Education material provided: meter and meter instructions If problems or questions, patient to contact team via:  Phone  Fture DSME appointment: - 4-6 wks

## 2015-01-24 NOTE — Progress Notes (Signed)
Case discussed with Dr. Glenn at the time of the visit.  We reviewed the resident's history and exam and pertinent patient test results.  I agree with the assessment, diagnosis, and plan of care documented in the resident's note.   

## 2015-01-24 NOTE — Assessment & Plan Note (Addendum)
Lab Results  Component Value Date   HGBA1C 7.4 01/17/2015   HGBA1C 7.5 10/13/2014   HGBA1C 8.3 02/01/2014     Assessment: Diabetes control:  Fair Progress toward A1C goal:   Improved Comments: Pt not taking Lantus and is not checking her sugars. She has been off the Lantus for a while. She is out of pen needles and does not have a meter  Plan: Medications:  Continue metformin 1000mg  BID. Holding on Lantus as A1c is pretty good off insulin Home glucose monitoring: None Frequency:   Timing:   Instruction/counseling given: reminded to get eye exam, reminded to bring blood glucose meter & log to each visit and discussed diet Educational resources provided: brochure (information given ti family as patient denied need for) Self management tools provided:   Other plans: Referring to [employer]  for diabetes education.  Pt to check CBGs at least once a day at the same time each day and bring her meter with her to her next visit.

## 2015-01-24 NOTE — Patient Instructions (Addendum)
Stop taking the spironolactone. This can increase your potassium. Return to the clinic in 2 weeks for repeat blood draws.    Please make am appointment same day as doctor in the next few weeks.   Please bring your meter to all appointments.   Thank you!  While your potassium is high avoid these foods:   Potatoes Tomatoes and tomato juice Oranges and orange juice' Bananas   General Instructions:   Please bring your medicines with you each time you come to clinic.  Medicines may include prescription medications, over-the-counter medications, herbal remedies, eye drops, vitamins, or other pills.   Progress Toward Treatment Goals:  Treatment Goal 01/17/2015  Hemoglobin A1C at goal  Blood pressure at goal  Stop smoking -  Prevent falls -    Self Care Goals & Plans:  Self Care Goal 01/17/2015  Manage my medications take my medicines as prescribed; bring my medications to every visit; refill my medications on time  Monitor my health -  Eat healthy foods drink diet soda or water instead of juice or soda; eat more vegetables; eat foods that are low in salt; eat baked foods instead of fried foods; eat fruit for snacks and desserts  Be physically active -  Stop smoking -    Home Blood Glucose Monitoring 03/08/2014  Check my blood sugar 3 times a day  When to check my blood sugar before meals     Care Management & Community Referrals:  Referral 03/08/2014  Referrals made for care management support other (see comment)

## 2015-01-25 ENCOUNTER — Other Ambulatory Visit: Payer: Self-pay | Admitting: Dietician

## 2015-01-25 DIAGNOSIS — E119 Type 2 diabetes mellitus without complications: Secondary | ICD-10-CM

## 2015-01-25 NOTE — Progress Notes (Signed)
received notice that patients part D [employer] does not cover one touch meter and supplies. They prefer accuchek. Patient was called and notified. She  verbalized understanding.  Patient called triage saying she check her blood sugar 3x a day and did not get lancets. I do not see where nay of the new order have been sign, change to 3 times a day if okay with physician and send to [facility] on [address].

## 2015-01-26 ENCOUNTER — Other Ambulatory Visit: Payer: Self-pay | Admitting: Internal Medicine

## 2015-01-26 MED ORDER — GLUCOSE BLOOD VI STRP
ORAL_STRIP | Status: AC
Start: 2015-01-26 — End: ?

## 2015-01-26 MED ORDER — ACCU-CHEK SOFTCLIX LANCETS MISC: Status: AC

## 2015-01-26 MED ORDER — ACCU-CHEK AVIVA PLUS W/DEVICE KIT: PACK | Status: AC

## 2015-01-26 NOTE — Telephone Encounter (Signed)
Rx called in to pharmacy. 

## 2015-01-27 ENCOUNTER — Other Ambulatory Visit: Payer: Self-pay | Admitting: Internal Medicine

## 2015-02-07 ENCOUNTER — Ambulatory Visit (INDEPENDENT_AMBULATORY_CARE_PROVIDER_SITE_OTHER): Payer: Medicare Other | Admitting: Internal Medicine

## 2015-02-07 ENCOUNTER — Encounter: Payer: Self-pay | Admitting: Internal Medicine

## 2015-02-07 VITALS — BP 156/78 | HR 69 | Temp 97.9°F | Ht 66.0 in | Wt 242.6 lb

## 2015-02-07 DIAGNOSIS — Z114 Encounter for screening for human immunodeficiency virus [HIV]: Secondary | ICD-10-CM | POA: Diagnosis not present

## 2015-02-07 DIAGNOSIS — E875 Hyperkalemia: Secondary | ICD-10-CM | POA: Diagnosis not present

## 2015-02-07 DIAGNOSIS — I10 Essential (primary) hypertension: Secondary | ICD-10-CM

## 2015-02-07 DIAGNOSIS — K219 Gastro-esophageal reflux disease without esophagitis: Secondary | ICD-10-CM | POA: Diagnosis not present

## 2015-02-07 DIAGNOSIS — Z0000 Encounter for general adult medical examination without abnormal findings: Secondary | ICD-10-CM

## 2015-02-07 LAB — BASIC METABOLIC PANEL WITH GFR
BUN: 18 mg/dL (ref 6–23)
CO2: 27 meq/L (ref 19–32)
Calcium: 9.8 mg/dL (ref 8.4–10.5)
Chloride: 106 mEq/L (ref 96–112)
Creat: 1.1 mg/dL (ref 0.50–1.10)
GFR, EST AFRICAN AMERICAN: 64 mL/min
GFR, Est Non African American: 55 mL/min — ABNORMAL LOW
Glucose, Bld: 156 mg/dL — ABNORMAL HIGH (ref 70–99)
Potassium: 5.3 mEq/L (ref 3.5–5.3)
SODIUM: 139 meq/L (ref 135–145)

## 2015-02-07 MED ORDER — ESOMEPRAZOLE MAGNESIUM 20 MG PO CPDR: 20.0000 mg | DELAYED_RELEASE_CAPSULE | Freq: Every day | ORAL | Status: DC

## 2015-02-07 NOTE — Patient Instructions (Addendum)
-  Start taking nexium 20 mg daily (1-2 hrs before eat breakfast in the morning) for acid reflux and avoid foods listed below -Avoid taking advil, ibupofren, motrin, etc for pain  -Will check your bloodwork today and call you with the results -Pleasure meeting you today!  Gastroesophageal Reflux Disease, Adult Gastroesophageal reflux disease (GERD) happens when acid from your stomach goes into your food pipe (esophagus). The acid can cause a burning feeling in your chest. Over time, the acid can make small holes (ulcers) in your food pipe.  HOME CARE  Ask your doctor for advice about:  Losing weight.  Quitting smoking.  Alcohol use.  Avoid foods and drinks that make your problems worse. You may want to avoid:  Caffeine and alcohol.  Chocolate.  Mints.  Garlic and onions.  Spicy foods.  Citrus fruits, such as oranges, lemons, or limes.  Foods that contain tomato, such as sauce, chili, salsa, and pizza.  Fried and fatty foods.  Avoid lying down for 3 hours before you go to bed or before you take a nap.  Eat small meals often, instead of large meals.  Wear loose-fitting clothing. Do not wear anything tight around your waist.  Raise (elevate) the head of your bed 6 to 8 inches with wood blocks. Using extra pillows does not help.  Only take medicines as told by your doctor.  Do not take aspirin or ibuprofen. GET HELP RIGHT AWAY IF:   You have pain in your arms, neck, jaw, teeth, or back.  Your pain gets worse or changes.  You feel sick to your stomach (nauseous), throw up (vomit), or sweat (diaphoresis).  You feel short of breath, or you pass out (faint).  Your throw up is green, yellow, black, or looks like coffee grounds or blood.  Your poop (stool) is red, bloody, or black. MAKE SURE YOU:   Understand these instructions.  Will watch your condition.  Will get help right away if you are not doing well or get worse. Document Released: 04/23/2008 Document  Revised: 01/28/2012 Document Reviewed: 05/25/2011 [facility] Patient Information 2015 [place], [place]. This information is not intended to replace advice given to you by your health care provider. Make sure you discuss any questions you have with your health care provider.    General Instructions:   Thank you for bringing your medicines today. This helps [place] keep you safe from mistakes.   Progress Toward Treatment Goals:  Treatment Goal 01/17/2015  Hemoglobin A1C at goal  Blood pressure at goal  Stop smoking -  Prevent falls -    Self Care Goals & Plans:  Self Care Goal 01/17/2015  Manage my medications take my medicines as prescribed; bring my medications to every visit; refill my medications on time  Monitor my health -  Eat healthy foods drink diet soda or water instead of juice or soda; eat more vegetables; eat foods that are low in salt; eat baked foods instead of fried foods; eat fruit for snacks and desserts  Be physically active -  Stop smoking -    Home Blood Glucose Monitoring 03/08/2014  Check my blood sugar 3 times a day  When to check my blood sugar before meals     Care Management & Community Referrals:  Referral 03/08/2014  Referrals made for care management support other (see comment)

## 2015-02-07 NOTE — Progress Notes (Signed)
Patient ID: Debra Graham, female   DOB: 07-07-1956, 59 y.o.   MRN: [id number]    Subjective:   Patient ID: Debra Graham female   DOB: [birth year] 59 y.o.   MRN: [id number]  HPI: [name] is a 59 y.o. pleasant woman with past medical history of hypertension, hyperlipidemia, Type 2 DM, colon adenocarcinoma s/psubtotal colectomy in 5361, chronic systolic CHF, OA, and depression who presents for follow-up visit of hyperkalemia.  She was found to have hyperkalemia of 5.9 on 3/2 requiring ED visit. Her last potassium level was 5.1 on 3/7.  Her hyperkalemia was thought to be due to concomitant use of spironolactone and lisinopril. Her spironolactone was consequently discontinued but is still taking lisinopril 40 mg daily. She denies weakness, chest pain, palpitations, or muscle cramping.   She is compliant with taking amlodipine, carvedilol, and lasix for hypertension. She has chronic blurry vision and LE swelling with mild headache but denies lightheadedness.   She reports having acid reflux symptoms of heart burn and regurgitation for some time now and is not currently on PPI therapy. She denies dysphagia, odynophagia, melena, or weight loss.        Past Medical History  Diagnosis Date  . Osteoarthritis   . Diabetes mellitus   . Hyperlipidemia   . History of cocaine abuse   . Hypertension   . Adenocarcinoma, colon 11/09    s/p subtotal colectomy with primary anastamosis by Dr. Excell Seltzer; Chemotherapy per Dr. Jonette Eva   . patient also endorses chronic intermittent abdominal aching and loose stools a/c with her colon cancer and Sx.   . Carpal tunnel syndrome   . Peripheral neuropathy   . Chronic systolic heart failure     LHC 01/04/12: Luminal irregularities, no significant CAD, elevated LVEDP;  Echocardiogram 01/03/12: Mild LVH, EF 35-40%, moderate LAE.    [name] Depression   . NICM (nonischemic cardiomyopathy)     ? HTN cardiomyopathy   Current Outpatient Prescriptions    Medication Sig Dispense Refill  . ACCU-CHEK SOFTCLIX LANCETS lancets Check blood sugar 3 times a day 100 each 12  . amLODipine (NORVASC) 10 MG tablet TAKE 1 TABLET BY MOUTH EVERY DAY 30 tablet 4  . Blood Glucose Monitoring Suppl (ACCU-CHEK AVIVA PLUS) W/DEVICE KIT Check blood sugar 3 times a day 1 kit 0  . carvedilol (COREG) 6.25 MG tablet Take 1 tablet (6.25 mg total) by mouth 2 (two) times daily with a meal. (Patient taking differently: Take 9.375 mg by mouth 2 (two) times daily with a meal. ) 180 tablet 1  . DULoxetine (CYMBALTA) 60 MG capsule TAKE ONE CAPSULE BY MOUTH EVERY DAY 30 capsule 2  . furosemide (LASIX) 40 MG tablet Take 1 tablet (40 mg total) by mouth 2 (two) times daily. 180 tablet 4  . glucose blood (ACCU-CHEK AVIVA PLUS) test strip Check blood sugar 3 times a day 100 each 12  . ibuprofen (ADVIL,MOTRIN) 600 MG tablet Take 1 tablet (600 mg total) by mouth every 8 (eight) hours. Take with food. (Patient not taking: Reported on 01/24/2015) 90 tablet 0  . Insulin Pen Needle 32G X 4 MM MISC Use to inject insulin one time a day 100 each 4  . LANTUS SOLOSTAR 100 UNIT/ML injection INJECT 14 UNITS INTO THE SKIN AS DIRECTED AT BEDTIME (Patient not taking: Reported on 01/24/2015) 15 mL 6  . lisinopril (PRINIVIL,ZESTRIL) 40 MG tablet Take 1 tablet (40 mg total) by mouth daily. 90 tablet 4  . metFORMIN (GLUCOPHAGE) 1000 MG  tablet Take 1 tablet (1,000 mg total) by mouth 2 (two) times daily with a meal. 60 tablet 11  . nitroGLYCERIN (NITROSTAT) 0.4 MG SL tablet Place 1 tablet (0.4 mg total) under the tongue every 5 (five) minutes as needed for chest pain. (Patient not taking: Reported on 01/24/2015) 25 tablet 3  . simvastatin (ZOCOR) 40 MG tablet Take 1 tablet (40 mg total) by mouth daily. 30 tablet 11  . spironolactone (ALDACTONE) 25 MG tablet Take 0.5 tablets (12.5 mg total) by mouth daily. (Patient taking differently: Take 25 mg by mouth daily. ) 45 tablet 4  . traMADol (ULTRAM) 50 MG tablet TAKE 1  TABLET BY MOUTH EVERY 6 HOURS AS NEEDED 90 tablet 0  . traZODone (DESYREL) 50 MG tablet Take 1 tablet (50 mg total) by mouth at bedtime as needed. As needed for sleep. (Patient not taking: Reported on 01/24/2015) 30 tablet 2   No current facility-administered medications for this visit.   Family History  Problem Relation Age of Onset  . Breast cancer Father   . Hypertension Mother   . Heart disease Mother   . Heart attack Brother     All siblings passed away  . Cancer      Mother's side of family   History   Social History  . Marital Status: Legally Separated    Spouse Name: N/A  . Number of Children: 3  . Years of Education: 12   Occupational History  . Disabled     Social History Main Topics  . Smoking status: Current Some Day Smoker -- 0.30 packs/day    Types: Cigarettes  . Smokeless tobacco: Not on file     Comment: Seldom finish a whole cigarette.  . Alcohol Use: 0.0 oz/week    0 Standard drinks or equivalent per week     Comment: Wine/beer sometimes.  . Drug Use: No  . Sexual Activity: Not on file   Other Topics Concern  . Not on file   Social History Narrative   Lives in [place], [place] with son and family.    Review of Systems: Review of Systems  Constitutional: Negative for fever and chills.  Eyes: Positive for blurred vision (chronic).  Respiratory: Positive for cough and wheezing. Negative for shortness of breath.   Cardiovascular: Positive for leg swelling (chronic b/l LE).  Gastrointestinal: Positive for heartburn and abdominal pain (chronic). Negative for nausea, vomiting, diarrhea and constipation.  Genitourinary: Negative for dysuria, urgency and frequency.  Neurological: Positive for headaches. Negative for dizziness.    Objective:  Physical Exam: Filed Vitals:   02/07/15 0916  BP: 156/78  Pulse: 69  Temp: 97.9 F (36.6 C)  TempSrc: Oral  Height: _0  (1.676 m)  Weight: 242 lb 9.6 oz (110.043 kg)  SpO2: 100%    Physical Exam    Constitutional: She is oriented to person, place, and time. She appears well-developed and well-nourished. No distress.  HENT:  Head: Normocephalic and atraumatic.  Right Ear: External ear normal.  Left Ear: External ear normal.  Nose: Nose normal.  Mouth/Throat: Oropharynx is clear and moist. No oropharyngeal exudate.  Eyes: Conjunctivae and EOM are normal. Pupils are equal, round, and reactive to light. Right eye exhibits no discharge. Left eye exhibits no discharge.  Neck: Normal range of motion. Neck supple.  Cardiovascular: Normal rate and regular rhythm.   Pulmonary/Chest: Effort normal and breath sounds normal. No respiratory distress. She has no wheezes. She has no rales.  Abdominal: Soft. Bowel sounds are  normal. She exhibits no distension. There is no tenderness. There is no rebound and no guarding.  Musculoskeletal: Normal range of motion. She exhibits edema (trace b/l LE ). She exhibits no tenderness.  Neurological: She is alert and oriented to person, place, and time.  Skin: Skin is warm and dry. No rash noted. She is not diaphoretic. No erythema. No pallor.  Psychiatric: She has a normal mood and affect. Her behavior is normal. Judgment and thought content normal.    Assessment & Plan:   Please see problem list for problem-based assessment and plan

## 2015-02-08 DIAGNOSIS — K219 Gastro-esophageal reflux disease without esophagitis: Secondary | ICD-10-CM | POA: Insufficient documentation

## 2015-02-08 LAB — HIV ANTIBODY (ROUTINE TESTING W REFLEX): HIV: NONREACTIVE

## 2015-02-08 MED ORDER — LISINOPRIL 40 MG PO TABS: 20.0000 mg | ORAL_TABLET | Freq: Every day | ORAL | Status: DC

## 2015-02-08 MED ORDER — OMEPRAZOLE 20 MG PO CPDR: 20.0000 mg | DELAYED_RELEASE_CAPSULE | Freq: Every day | ORAL | Status: AC

## 2015-02-08 NOTE — Assessment & Plan Note (Signed)
Assessment: Pt with moderately well-controlled hypertension compliant with four-class (diuretic, CCB, BB & ACEi) anti-hypertensive therapy who presents with blood pressure of 156/78.   Plan:  -BP 156/78 not at goal <140/90 (pt reports being rushed to find parking space)  -Decrease lisinopril from 40 mg daily to 20 mg daily in setting of high normal potassium level (5.3) -Continue amlodipine 10 mg daily, carvedilol 6.25 mg BID, and furosemide 40 mg BID -Obtain BMP ---> stable CKD Stage 2

## 2015-02-08 NOTE — Assessment & Plan Note (Signed)
-  Pt scheduled for screening mammography -Obtain screening HIV Ab ---> normal

## 2015-02-08 NOTE — Assessment & Plan Note (Addendum)
Assessment: Pt with symptoms of acid reflux disease not on PPI therapy who presents with no alarm symptoms.   Plan: -Initially nexium 20 mg daily prescribed however pt unable to afford, switched to omeprazole 20 mg daily  -Pt given handout on GERD  -Monitor for alarm symptoms warranting EGD

## 2015-02-08 NOTE — Assessment & Plan Note (Addendum)
Assessment: Pt with recent hyperkalemia in setting of spironolactone and lisinopril use who presents with high normal level.   Plan:  -Obtain BMP ---> K 5.3 upper end of normal, Cr stable at 1.10 (CKD Stage 2)  -Pt instructed to decrease lisinopril to 20 mg daily in setting of high normal potassium level  -Pt instructed to follow low-potassium diet and to return in 1 month for BMP recheck

## 2015-02-10 NOTE — Progress Notes (Signed)
[facility] Clinic Attending Date of visit: 02/07/2015  Case discussed with Dr. Naaman Plummer soon after the resident saw the patient on the day of the visit .  We reviewed the resident's history and exam and pertinent patient test results.  I agree with the assessment, diagnosis, and plan of care documented in the resident's note, with the following additional comments.  Since the lisinopril dose was decreased and her blood pressure was moderately elevated on the day of the visit, would have her return for a physician visit in one month to make sure her blood pressure is adequately controlled and to recheck her basic metabolic panel.

## 2015-02-15 ENCOUNTER — Ambulatory Visit (HOSPITAL_COMMUNITY): Payer: Medicare Other

## 2015-03-16 ENCOUNTER — Other Ambulatory Visit: Payer: Self-pay | Admitting: *Deleted

## 2015-03-16 MED ORDER — TRAMADOL HCL 50 MG PO TABS: 50.0000 mg | ORAL_TABLET | Freq: Four times a day (QID) | ORAL | Status: DC | PRN

## 2015-03-16 NOTE — Telephone Encounter (Signed)
Talked to pharmacy - last refill 01/26/15 #90. Hilda Blades Kdyn Vonbehren RN 03/16/15 10:30AM

## 2015-03-17 NOTE — Telephone Encounter (Signed)
Rx called in to pharmacy. 

## 2015-03-29 ENCOUNTER — Encounter: Payer: Self-pay | Admitting: *Deleted

## 2015-04-07 ENCOUNTER — Telehealth: Payer: Self-pay | Admitting: Internal Medicine

## 2015-04-07 NOTE — Telephone Encounter (Signed)
Call to patient to confirm appointment for 04/11/15 at 1:45 and 2:30 lmtcb

## 2015-04-11 ENCOUNTER — Ambulatory Visit (INDEPENDENT_AMBULATORY_CARE_PROVIDER_SITE_OTHER): Payer: Medicare Other | Admitting: Internal Medicine

## 2015-04-11 ENCOUNTER — Other Ambulatory Visit: Payer: Self-pay | Admitting: Internal Medicine

## 2015-04-11 ENCOUNTER — Ambulatory Visit: Payer: Medicare Other | Admitting: Dietician

## 2015-04-11 ENCOUNTER — Encounter: Payer: Self-pay | Admitting: Internal Medicine

## 2015-04-11 VITALS — BP 170/77 | HR 76 | Temp 98.2°F | Ht 66.0 in | Wt 236.0 lb

## 2015-04-11 DIAGNOSIS — Z794 Long term (current) use of insulin: Secondary | ICD-10-CM

## 2015-04-11 DIAGNOSIS — I10 Essential (primary) hypertension: Secondary | ICD-10-CM | POA: Diagnosis present

## 2015-04-11 DIAGNOSIS — Z0000 Encounter for general adult medical examination without abnormal findings: Secondary | ICD-10-CM

## 2015-04-11 DIAGNOSIS — E119 Type 2 diabetes mellitus without complications: Secondary | ICD-10-CM | POA: Diagnosis not present

## 2015-04-11 DIAGNOSIS — I5022 Chronic systolic (congestive) heart failure: Secondary | ICD-10-CM | POA: Diagnosis not present

## 2015-04-11 DIAGNOSIS — Z9049 Acquired absence of other specified parts of digestive tract: Secondary | ICD-10-CM | POA: Diagnosis not present

## 2015-04-11 DIAGNOSIS — E875 Hyperkalemia: Secondary | ICD-10-CM

## 2015-04-11 DIAGNOSIS — C189 Malignant neoplasm of colon, unspecified: Secondary | ICD-10-CM

## 2015-04-11 LAB — BASIC METABOLIC PANEL
BUN: 15 mg/dL (ref 6–23)
CO2: 26 meq/L (ref 19–32)
Calcium: 9.7 mg/dL (ref 8.4–10.5)
Chloride: 107 mEq/L (ref 96–112)
Creat: 0.98 mg/dL (ref 0.50–1.10)
Glucose, Bld: 232 mg/dL — ABNORMAL HIGH (ref 70–99)
POTASSIUM: 4.4 meq/L (ref 3.5–5.3)
Sodium: 141 mEq/L (ref 135–145)

## 2015-04-11 LAB — POCT GLYCOSYLATED HEMOGLOBIN (HGB A1C): Hemoglobin A1C: 7.3

## 2015-04-11 LAB — GLUCOSE, CAPILLARY: Glucose-Capillary: 228 mg/dL — ABNORMAL HIGH (ref 65–99)

## 2015-04-11 NOTE — Progress Notes (Signed)
Patient did not stay for this visit. However, nurse asks CDE to assist with obtaining test strips for meter

## 2015-04-11 NOTE — Assessment & Plan Note (Signed)
-  Screening mammography still pending. -Foot exam deferred today due to time.

## 2015-04-11 NOTE — Assessment & Plan Note (Addendum)
Patient has not followed up with oncology in a long time. There is note from last year in 2015 that she did have a normal colonoscopy. Patient expressed that she would be following up with her oncologist soon.

## 2015-04-11 NOTE — Assessment & Plan Note (Addendum)
Basic metabolic panel drawn today. Will titrate lisinopril as appropriate.  Addendum 04/12/15:  Potassium normal at 4.4. Will continue current anti-hypertensive regimen.

## 2015-04-11 NOTE — Progress Notes (Signed)
   Subjective:    Patient ID: Debra Graham, female    DOB: 03-24-1956, 59 y.o.   MRN: [id number]  HPI  Patient is a 59 year old with a history of type 2 diabetes, chronic systolic heart failure,: Colon adenocarcinoma status post subtotal colectomy in 2009, osteoarthritis, depression, GERD, obesity, bilateral knee pain who presents to clinic for diabetes and hypertension follow-up.   Please refer to separate problem-list charting for more details.  Review of Systems  Constitutional: Negative for fever and chills.  HENT: Negative for rhinorrhea and sore throat.   Eyes: Negative for visual disturbance.  Respiratory: Negative for cough and shortness of breath.   Cardiovascular: Negative for chest pain and palpitations.  Gastrointestinal: Negative for nausea, vomiting, abdominal pain, diarrhea, constipation and blood in stool.  Genitourinary: Negative for dysuria and hematuria.  Neurological: Negative for syncope.       Objective:   Physical Exam  Constitutional: She is oriented to person, place, and time. She appears well-developed and well-nourished. No distress.  HENT:  Head: Normocephalic and atraumatic.  Eyes: EOM are normal. Pupils are equal, round, and reactive to light.  Neck: Normal range of motion. Neck supple. No thyromegaly present.  Cardiovascular: Normal rate and regular rhythm.  Exam reveals no gallop and no friction rub.   No murmur heard. Pulmonary/Chest: Effort normal and breath sounds normal. No respiratory distress. She has no wheezes. She has no rales.  Abdominal: Soft. Bowel sounds are normal. She exhibits no distension. There is no tenderness. There is no rebound.  Musculoskeletal: She exhibits no edema.  Neurological: She is alert and oriented to person, place, and time. No cranial nerve deficit.  Skin: Skin is warm and dry. No rash noted.  Psychiatric: She has a normal mood and affect. Thought content normal.          Assessment & Plan:  Please  refer to separate problem-list charting for more details.

## 2015-04-11 NOTE — Patient Instructions (Signed)
Please continue doing a good job in controlling your diabetes. Your A1c was 7.3. I will contact you regarding the results of your blood tests.

## 2015-04-11 NOTE — Assessment & Plan Note (Addendum)
BP Readings from Last 3 Encounters:  04/11/15 170/77  02/07/15 156/78  01/24/15 137/71    Lab Results  Component Value Date   NA 139 02/07/2015   K 5.3 02/07/2015   CREATININE 1.10 02/07/2015    Assessment: Blood pressure control: moderately elevated Progress toward BP goal:  unchanged Comments: Patient states that she has not taken her blood pressure medications today. She states that she has been compliant with her regimen of amlodipine 10 mg daily, carvedilol 9.375 mg twice a day, Lasix 40 mg twice a day, and lisinopril 40 mg daily (Although recent regimen was changed to 20 mg daily, she has continued to take her 40 mg daily dosing). Her regimen has recently been complicated by hyperkalemia which was the reason for the recent prescribed reduction. Spironolactone was also recently discontinued.  Addendum 04/12/15: Potassium normal at 4.4 and a borderline though much improved microalbumin/creatinine ratio at 30. Will continue regimen as described above.    Plan: Medications:  continue current medications. Particularly, recommend that patient continue with her lisinopril 40 mg daily.  Educational resources provided: brochure (denies) Self management tools provided:   Other plans: Will follow-up basic metabolic panel and consider changes to lisinopril if necessary. May consider discontinuation of lisinopril and up titration of carvedilol if necessary. Return to clinic in one month for blood pressure check.

## 2015-04-11 NOTE — Assessment & Plan Note (Signed)
Lab Results  Component Value Date   HGBA1C 7.3 04/11/2015   HGBA1C 7.4 01/17/2015   HGBA1C 7.5 10/13/2014     Assessment: Diabetes control: good control (HgbA1C at goal) Progress toward A1C goal:  at goal Comments: Patient states that she is compliant with her Lantus 14 units at bedtime.  Plan: Medications:  continue current medications Home glucose monitoring: Frequency:   3 times a day Timing:   before meals Instruction/counseling given: reminded to bring blood glucose meter & log to each visit Educational resources provided: brochure (denies) Self management tools provided:   Other plans: Patient to reschedule missed appointment with Debera Lat.

## 2015-04-12 LAB — MICROALBUMIN / CREATININE URINE RATIO
Creatinine, Urine: 112.5 mg/dL
Microalb Creat Ratio: 269.3 mg/g — ABNORMAL HIGH (ref 0.0–30.0)
Microalb, Ur: 30.3 mg/dL — ABNORMAL HIGH (ref <2.0)

## 2015-04-12 MED ORDER — CARVEDILOL 6.25 MG PO TABS: 9.3750 mg | ORAL_TABLET | Freq: Two times a day (BID) | ORAL | Status: DC

## 2015-04-12 MED ORDER — LISINOPRIL 40 MG PO TABS: 40.0000 mg | ORAL_TABLET | Freq: Every day | ORAL | Status: DC

## 2015-04-12 NOTE — Progress Notes (Signed)
Case discussed with Dr. Raelene Bott at time of visit. We reviewed the resident's history and exam and pertinent patient test results. I agree with the assessment, diagnosis, and plan of care documented in the resident's note.  I would have a very low threshold to titrate up the carvedilol dose at the follow-up visit assuming the pulse can tolerate it and the patient remains hypertensive.

## 2015-04-12 NOTE — Assessment & Plan Note (Signed)
No signs of decompensation despite recent discontinuation of spironolactone. Will continue with current regimen of carvedilol 9.375 mg BID (patient's reported dosing), lisinopril 40 mg daily, lasix 40 mg BID.

## 2015-04-12 NOTE — Addendum Note (Signed)
Addended by: Luan Moore on: 04/12/2015 09:07 AM   Modules accepted: Orders

## 2015-05-20 ENCOUNTER — Other Ambulatory Visit: Payer: Self-pay | Admitting: Internal Medicine

## 2015-05-20 NOTE — Telephone Encounter (Signed)
BP not controlled. Was to have followed up June but no appt given. PCP [facility] has openings mid Aug. Ok to wait until then to sch in CC slot. If pt doesn't want to wait, [facility] res appt.

## 2015-05-20 NOTE — Telephone Encounter (Signed)
message to front desk for appointment

## 2015-07-07 ENCOUNTER — Telehealth: Payer: Self-pay | Admitting: Internal Medicine

## 2015-07-07 NOTE — Telephone Encounter (Signed)
Call to patient to confirm appointment for 07/08/15 at 3:30 lmtcb

## 2015-07-08 ENCOUNTER — Ambulatory Visit (INDEPENDENT_AMBULATORY_CARE_PROVIDER_SITE_OTHER): Payer: Medicare Other | Admitting: Internal Medicine

## 2015-07-08 VITALS — BP 169/80 | HR 80 | Temp 99.0°F | Wt 253.7 lb

## 2015-07-08 DIAGNOSIS — E11339 Type 2 diabetes mellitus with moderate nonproliferative diabetic retinopathy without macular edema: Secondary | ICD-10-CM | POA: Diagnosis not present

## 2015-07-08 DIAGNOSIS — E11359 Type 2 diabetes mellitus with proliferative diabetic retinopathy without macular edema: Secondary | ICD-10-CM | POA: Diagnosis not present

## 2015-07-08 DIAGNOSIS — Z0000 Encounter for general adult medical examination without abnormal findings: Secondary | ICD-10-CM

## 2015-07-08 DIAGNOSIS — I10 Essential (primary) hypertension: Secondary | ICD-10-CM

## 2015-07-08 DIAGNOSIS — E08351 Diabetes mellitus due to underlying condition with proliferative diabetic retinopathy with macular edema: Secondary | ICD-10-CM | POA: Diagnosis not present

## 2015-07-08 DIAGNOSIS — E11331 Type 2 diabetes mellitus with moderate nonproliferative diabetic retinopathy with macular edema: Secondary | ICD-10-CM | POA: Diagnosis not present

## 2015-07-08 DIAGNOSIS — E113599 Type 2 diabetes mellitus with proliferative diabetic retinopathy without macular edema, unspecified eye: Secondary | ICD-10-CM

## 2015-07-08 DIAGNOSIS — E119 Type 2 diabetes mellitus without complications: Secondary | ICD-10-CM

## 2015-07-08 DIAGNOSIS — F17210 Nicotine dependence, cigarettes, uncomplicated: Secondary | ICD-10-CM | POA: Diagnosis not present

## 2015-07-08 DIAGNOSIS — Z79899 Other long term (current) drug therapy: Secondary | ICD-10-CM

## 2015-07-08 DIAGNOSIS — H4311 Vitreous hemorrhage, right eye: Secondary | ICD-10-CM | POA: Diagnosis not present

## 2015-07-08 LAB — HM DIABETES EYE EXAM

## 2015-07-08 LAB — GLUCOSE, CAPILLARY: Glucose-Capillary: 154 mg/dL — ABNORMAL HIGH (ref 65–99)

## 2015-07-08 LAB — POCT GLYCOSYLATED HEMOGLOBIN (HGB A1C): HEMOGLOBIN A1C: 7.6

## 2015-07-08 MED ORDER — CARVEDILOL 12.5 MG PO TABS: 12.5000 mg | ORAL_TABLET | Freq: Two times a day (BID) | ORAL | Status: DC

## 2015-07-08 NOTE — Patient Instructions (Signed)
Your blood pressure remained elevated today same as last appointment. We will increase your carvedilol to 12.5mg  (2 of current small pills, until refill new prescription) twice daily.  Your hemoglobin A1c was increasing today at 7.6. This is above the goal for diabetes control and for now we recommend strictly following metformin twice daily and controlling your diet. If this stays increasing we may need additional medications to avoid new adverse effects on your heart, kidneys, nerves, and eyes.  We are giving you a pill planner to help with taking medications appropriately. You may use this or get a new one if you prefer.

## 2015-07-08 NOTE — Progress Notes (Signed)
Subjective:   Patient ID: Debra Graham female   DOB: 1956/04/30 59 y.o.   MRN: [id number]  HPI: [name] is a 59 y.o. woman with past medical history as described below presenting in follow-up for her blood pressure management. She also has a new complaint of worsening right vision and floaters evaluated by her ophthalmologist for retinal hemorrhages and is pending surgery next Wednesday.  See problem based assessment and plan for additional details.  Past Medical History  Diagnosis Date  . Osteoarthritis   . Diabetes mellitus   . Hyperlipidemia   . History of cocaine abuse   . Hypertension   . Adenocarcinoma, colon 11/09    s/p subtotal colectomy with primary anastamosis by Dr. Excell Seltzer; Chemotherapy per Dr. Jonette Eva   . patient also endorses chronic intermittent abdominal aching and loose stools a/c with her colon cancer and Sx.   . Carpal tunnel syndrome   . Peripheral neuropathy   . Chronic systolic heart failure     LHC 01/04/12: Luminal irregularities, no significant CAD, elevated LVEDP;  Echocardiogram 01/03/12: Mild LVH, EF 35-40%, moderate LAE.    [name] Depression   . NICM (nonischemic cardiomyopathy)     ? HTN cardiomyopathy   Current Outpatient Prescriptions  Medication Sig Dispense Refill  . ACCU-CHEK SOFTCLIX LANCETS lancets Check blood sugar 3 times a day 100 each 12  . amLODipine (NORVASC) 10 MG tablet TAKE 1 TABLET BY MOUTH EVERY DAY 30 tablet 2  . Blood Glucose Monitoring Suppl (ACCU-CHEK AVIVA PLUS) W/DEVICE KIT Check blood sugar 3 times a day 1 kit 0  . carvedilol (COREG) 6.25 MG tablet Take 1.5 tablets (9.375 mg total) by mouth 2 (two) times daily with a meal. 180 tablet 1  . DULoxetine (CYMBALTA) 60 MG capsule TAKE ONE CAPSULE BY MOUTH EVERY DAY 30 capsule 2  . furosemide (LASIX) 40 MG tablet Take 1 tablet (40 mg total) by mouth 2 (two) times daily. 180 tablet 4  . glucose blood (ACCU-CHEK AVIVA PLUS) test strip Check blood sugar 3 times a day 100  each 12  . ibuprofen (ADVIL,MOTRIN) 600 MG tablet Take 1 tablet (600 mg total) by mouth every 8 (eight) hours. Take with food. (Patient not taking: Reported on 01/24/2015) 90 tablet 0  . Insulin Pen Needle 32G X 4 MM MISC Use to inject insulin one time a day 100 each 4  . LANTUS SOLOSTAR 100 UNIT/ML injection INJECT 14 UNITS INTO THE SKIN AS DIRECTED AT BEDTIME (Patient not taking: Reported on 01/24/2015) 15 mL 6  . lisinopril (PRINIVIL,ZESTRIL) 40 MG tablet Take 1 tablet (40 mg total) by mouth daily. 90 tablet 4  . metFORMIN (GLUCOPHAGE) 1000 MG tablet Take 1 tablet (1,000 mg total) by mouth 2 (two) times daily with a meal. 60 tablet 11  . nitroGLYCERIN (NITROSTAT) 0.4 MG SL tablet Place 1 tablet (0.4 mg total) under the tongue every 5 (five) minutes as needed for chest pain. (Patient not taking: Reported on 01/24/2015) 25 tablet 3  . omeprazole (PRILOSEC) 20 MG capsule Take 1 capsule (20 mg total) by mouth daily. 30 capsule 1  . simvastatin (ZOCOR) 40 MG tablet Take 1 tablet (40 mg total) by mouth daily. 30 tablet 11  . traMADol (ULTRAM) 50 MG tablet Take 1 tablet (50 mg total) by mouth every 6 (six) hours as needed. 90 tablet 0  . traZODone (DESYREL) 50 MG tablet Take 1 tablet (50 mg total) by mouth at bedtime as needed. As needed for sleep. (  Patient not taking: Reported on 01/24/2015) 30 tablet 2   No current facility-administered medications for this visit.   Family History  Problem Relation Age of Onset  . Breast cancer Father   . Hypertension Mother   . Heart disease Mother   . Heart attack Brother     All siblings passed away  . Cancer      Mother's side of family   Social History   Social History  . Marital Status: Legally Separated    Spouse Name: N/A  . Number of Children: 3  . Years of Education: 12   Occupational History  . Disabled     Social History Main Topics  . Smoking status: Current Some Day Smoker -- 0.30 packs/day    Types: Cigarettes  . Smokeless tobacco: Not on  file     Comment: Seldom finish a whole cigarette./ ABOUT 4 CIGARETTES PER DAY  . Alcohol Use: 0.0 oz/week    0 Standard drinks or equivalent per week     Comment: Wine/beer sometimes.  . Drug Use: No  . Sexual Activity: Not on file   Other Topics Concern  . Not on file   Social History Narrative   Lives in [place], [place] with son and family.    Review of Systems: Review of Systems  Constitutional: Negative for fever and chills.  HENT: Negative for sore throat and tinnitus.   Eyes: Positive for blurred vision and pain. Negative for double vision and photophobia.  Respiratory: Negative for cough and shortness of breath.   Cardiovascular: Positive for leg swelling. Negative for chest pain, palpitations and claudication.  Gastrointestinal: Negative for nausea, vomiting and abdominal pain.  Genitourinary: Negative for dysuria.  Musculoskeletal: Negative for back pain and falls.  Neurological: Positive for headaches. Negative for dizziness.    Objective:  Physical Exam: Filed Vitals:   07/08/15 1525  BP: 169/80  Pulse: 80  Temp: 99 F (37.2 C)  TempSrc: Oral  Weight: 253 lb 11.2 oz (115.078 kg)  SpO2: 99%   GENERAL- alert, co-operative, obese, NAD HEENT- Atraumatic, PERRL, oral mucosa appears moist, no carotid bruit, no cervical LN enlargement, fundoscopic exam limited without dilation CARDIAC- RRR, no murmurs, rubs or gallops. RESP- CTAB, no wheezes or crackles. BACK- Normal curvature, no paraspinal tenderness, no CVA tenderness. NEURO- Strength upper and lower extremities- 5/5, Sensation intact- diminished light touch sensation at the ankles and feet bilaterally, DTRs- 2+ bilaterally  EXTREMITIES- DP pulses 1+ bilaterally, unable to palpate PT, symmetric, 1+ pedal edema. SKIN- Warm, dry, no chronic venous stasis changes PSYCH- Normal mood and affect, appropriate thought content and speech.  Assessment & Plan:

## 2015-07-09 ENCOUNTER — Encounter: Payer: Self-pay | Admitting: Internal Medicine

## 2015-07-09 NOTE — Assessment & Plan Note (Signed)
Lab Results  Component Value Date   HGBA1C 7.6 07/08/2015   HGBA1C 7.3 04/11/2015   HGBA1C 7.4 01/17/2015     Assessment: Diabetes control: Fair Progress toward A1C goal:  Worse Comments: Currently taking metformin 1000mg  BID, has been off lantus. Hgb A1c worse today, may require basal insulin treatment to be resumed if trend continues. She has extensive adverse effects in right eye, b/l feet, CVD that make control a priority.  Plan: Medications: Continue metformin 1000mg  BID, given pill planner and hopefully improve compliance. Consider restart basal insulin is A1c worsens further Home glucose monitoring: Not checking at home regularly Frequency:   Timing:   Instruction/counseling given: Counseled on taking medication as directed, limiting sugary foods Educational resources provided:   Self management tools provided: Pill box provided Other plans:

## 2015-07-09 NOTE — Assessment & Plan Note (Signed)
Patient with retinal vessel proliferation and microscopic hemorrhages. Progressive vision loss and floaters in right eye. She was seen by othalmology this week and is following up 8/24 for surgery. Worsening blood pressure and diabetes control are likely contributory to this progression.

## 2015-07-09 NOTE — Assessment & Plan Note (Addendum)
BP Readings from Last 3 Encounters:  07/08/15 169/80  04/11/15 170/77  02/07/15 156/78    Lab Results  Component Value Date   NA 141 04/11/2015   K 4.4 04/11/2015   CREATININE 0.98 04/11/2015    Assessment: Blood pressure control: Moderately elevated  Progress toward BP goal:  Unchanged Comments: Blood pressure uncontrolled on past 2 visits with current treatment. She states that she has been compliant with her regimen of amlodipine 10 mg daily, carvedilol 9.375 mg twice a day, Lasix 40 mg twice a day, and lisinopril 40 mg daily  Plan: Medications:  Increase carvedilol to 12.5mg  BID.  Educational resources provided: Recommended checking home BP with cuff  Self management tools provided: Patient was provided with a TID pill container for managing medications Other plans: BP control is a high priority with history of systolic HF. Renin/Aldosterone level drawn to evaluate for resistant hypertension which was previously better controlled with a low dose of spironolactone. Follow up this result at next visit. If hypertension continues despite treatment and normal study consider other workup such as RAS. Will need to follow potassium for any change to diuresis, aldosterone antagonist restarting.

## 2015-07-09 NOTE — Assessment & Plan Note (Signed)
Foot exam today showing neuropathy but no significant progression since last exam. Soon to undergo opthalmology surgery on right eye and is being closely followed. Defers referral to mammography today until acute problems are better resolved.

## 2015-07-13 DIAGNOSIS — E11359 Type 2 diabetes mellitus with proliferative diabetic retinopathy without macular edema: Secondary | ICD-10-CM | POA: Diagnosis not present

## 2015-07-13 DIAGNOSIS — H4311 Vitreous hemorrhage, right eye: Secondary | ICD-10-CM | POA: Diagnosis not present

## 2015-07-13 DIAGNOSIS — H34831 Tributary (branch) retinal vein occlusion, right eye: Secondary | ICD-10-CM | POA: Diagnosis not present

## 2015-07-13 DIAGNOSIS — H3341 Traction detachment of retina, right eye: Secondary | ICD-10-CM | POA: Diagnosis not present

## 2015-07-13 DIAGNOSIS — H547 Unspecified visual loss: Secondary | ICD-10-CM | POA: Diagnosis not present

## 2015-07-16 LAB — ALDOSTERONE + RENIN ACTIVITY W/ RATIO
ALDOS/RENIN RATIO: 4.7 (ref 0.0–30.0)
ALDOSTERONE: 3.5 ng/dL (ref 0.0–30.0)
RENIN: 0.75 ng/mL/h

## 2015-07-18 ENCOUNTER — Encounter: Payer: Self-pay | Admitting: *Deleted

## 2015-07-19 NOTE — Progress Notes (Signed)
Internal Medicine Clinic Attending  I saw and evaluated the patient.  I personally confirmed the key portions of the history and exam documented by Dr. Rice and I reviewed pertinent patient test results.  The assessment, diagnosis, and plan were formulated together and I agree with the documentation in the resident's note.  

## 2015-08-12 DIAGNOSIS — E08351 Diabetes mellitus due to underlying condition with proliferative diabetic retinopathy with macular edema: Secondary | ICD-10-CM | POA: Diagnosis not present

## 2015-08-12 DIAGNOSIS — E11319 Type 2 diabetes mellitus with unspecified diabetic retinopathy without macular edema: Secondary | ICD-10-CM | POA: Diagnosis not present

## 2015-08-12 DIAGNOSIS — E11311 Type 2 diabetes mellitus with unspecified diabetic retinopathy with macular edema: Secondary | ICD-10-CM | POA: Diagnosis not present

## 2015-08-12 DIAGNOSIS — E08321 Diabetes mellitus due to underlying condition with mild nonproliferative diabetic retinopathy with macular edema: Secondary | ICD-10-CM | POA: Diagnosis not present

## 2015-10-11 ENCOUNTER — Encounter: Payer: Self-pay | Admitting: Student

## 2015-10-20 ENCOUNTER — Other Ambulatory Visit: Payer: Self-pay | Admitting: Internal Medicine

## 2015-10-21 ENCOUNTER — Other Ambulatory Visit: Payer: Self-pay | Admitting: Internal Medicine

## 2015-10-25 NOTE — Telephone Encounter (Signed)
rx phoned in

## 2015-10-25 NOTE — Telephone Encounter (Signed)
I do not see much documentation for this medicine from this year but it appears to be a chronic, longstanding prescription. Please call in prescription without refills at this time, she will need an appointment that documents need and appropriateness.

## 2015-11-08 ENCOUNTER — Ambulatory Visit (INDEPENDENT_AMBULATORY_CARE_PROVIDER_SITE_OTHER): Payer: Medicare Other | Admitting: Internal Medicine

## 2015-11-08 ENCOUNTER — Encounter: Payer: Self-pay | Admitting: Internal Medicine

## 2015-11-08 VITALS — BP 141/64 | HR 74 | Temp 98.2°F | Wt 259.8 lb

## 2015-11-08 DIAGNOSIS — E1142 Type 2 diabetes mellitus with diabetic polyneuropathy: Secondary | ICD-10-CM | POA: Diagnosis not present

## 2015-11-08 DIAGNOSIS — E113291 Type 2 diabetes mellitus with mild nonproliferative diabetic retinopathy without macular edema, right eye: Secondary | ICD-10-CM | POA: Diagnosis not present

## 2015-11-08 DIAGNOSIS — H2513 Age-related nuclear cataract, bilateral: Secondary | ICD-10-CM | POA: Diagnosis not present

## 2015-11-08 DIAGNOSIS — E113591 Type 2 diabetes mellitus with proliferative diabetic retinopathy without macular edema, right eye: Secondary | ICD-10-CM

## 2015-11-08 DIAGNOSIS — M25561 Pain in right knee: Secondary | ICD-10-CM

## 2015-11-08 DIAGNOSIS — E113599 Type 2 diabetes mellitus with proliferative diabetic retinopathy without macular edema, unspecified eye: Secondary | ICD-10-CM

## 2015-11-08 DIAGNOSIS — Z7984 Long term (current) use of oral hypoglycemic drugs: Secondary | ICD-10-CM | POA: Diagnosis not present

## 2015-11-08 DIAGNOSIS — F17210 Nicotine dependence, cigarettes, uncomplicated: Secondary | ICD-10-CM | POA: Diagnosis not present

## 2015-11-08 DIAGNOSIS — H40013 Open angle with borderline findings, low risk, bilateral: Secondary | ICD-10-CM | POA: Diagnosis not present

## 2015-11-08 DIAGNOSIS — I5022 Chronic systolic (congestive) heart failure: Secondary | ICD-10-CM

## 2015-11-08 DIAGNOSIS — E133521 Other specified diabetes mellitus with proliferative diabetic retinopathy with traction retinal detachment involving the macula, right eye: Secondary | ICD-10-CM | POA: Diagnosis not present

## 2015-11-08 DIAGNOSIS — I10 Essential (primary) hypertension: Secondary | ICD-10-CM

## 2015-11-08 DIAGNOSIS — M25562 Pain in left knee: Secondary | ICD-10-CM | POA: Diagnosis not present

## 2015-11-08 DIAGNOSIS — E083512 Diabetes mellitus due to underlying condition with proliferative diabetic retinopathy with macular edema, left eye: Secondary | ICD-10-CM | POA: Diagnosis not present

## 2015-11-08 DIAGNOSIS — E113293 Type 2 diabetes mellitus with mild nonproliferative diabetic retinopathy without macular edema, bilateral: Secondary | ICD-10-CM | POA: Diagnosis not present

## 2015-11-08 LAB — GLUCOSE, CAPILLARY: Glucose-Capillary: 128 mg/dL — ABNORMAL HIGH (ref 65–99)

## 2015-11-08 LAB — POCT GLYCOSYLATED HEMOGLOBIN (HGB A1C): Hemoglobin A1C: 8.7

## 2015-11-08 LAB — HM DIABETES EYE EXAM

## 2015-11-08 MED ORDER — GLIPIZIDE 5 MG PO TABS: 5.0000 mg | ORAL_TABLET | Freq: Every day | ORAL | Status: DC

## 2015-11-08 NOTE — Progress Notes (Signed)
Subjective:   Patient ID: BRIDNEY GUADARRAMA female   DOB: 01-07-1956 59 y.o.   MRN: [id number]  HPI: Ms.Shan P Sherrin is a 59 y.o. woman with past medical history as described below presenting a 4 month follow-up for her hypertension, diabetes, and osteoarthritis. She has been taking her medications without difficulty although she does report frequently missing her p.m. doses. She has not had any episodes of lightheadedness or worsening of her leg swelling. Her amount of exercise is decreased since changing her living condition now with her son and daughter-in-law. She was also off of all her medicines for several weeks while on a trip to [place] about 1-2 months ago. She underwent surgery for right eye cataract in August and was seen earlier in the morning before this visit for follow-up eye exam where she was noted to have recurrence of her cataract as well as possible retinal proliferation versus hemorrhage. Walking across the street the visit she did fall onto both knees when stepping off a sidewalk which she attributes to her dilated eyes and poor vision at that time. Her diabetic neuropathy has been unchanged. The osteoarthritis of her hips and knees has become slightly worse she now feels that her right knee "catches" while walking but she has not fallen as a result of this.  See problem based assessment and plan below for additional details.  Past Medical History  Diagnosis Date  . Osteoarthritis   . Diabetes mellitus   . Hyperlipidemia   . History of cocaine abuse   . Hypertension   . Adenocarcinoma, colon ([place]) 11/09    s/p subtotal colectomy with primary anastamosis by Dr. Excell Seltzer; Chemotherapy per Dr. Jonette Eva   . patient also endorses chronic intermittent abdominal aching and loose stools a/c with her colon cancer and Sx.   . Carpal tunnel syndrome   . Peripheral neuropathy ([place])   . Chronic systolic heart failure ([place])     LHC 01/04/12: Luminal irregularities, no significant  CAD, elevated LVEDP;  Echocardiogram 01/03/12: Mild LVH, EF 35-40%, moderate LAE.    [name] Depression   . NICM (nonischemic cardiomyopathy) ([place])     ? HTN cardiomyopathy   Current Outpatient Prescriptions  Medication Sig Dispense Refill  . ACCU-CHEK SOFTCLIX LANCETS lancets Check blood sugar 3 times a day 100 each 12  . amLODipine (NORVASC) 10 MG tablet TAKE 1 TABLET BY MOUTH EVERY DAY 30 tablet 2  . Blood Glucose Monitoring Suppl (ACCU-CHEK AVIVA PLUS) W/DEVICE KIT Check blood sugar 3 times a day 1 kit 0  . carvedilol (COREG) 12.5 MG tablet Take 1 tablet (12.5 mg total) by mouth 2 (two) times daily with a meal. 60 tablet 3  . DULoxetine (CYMBALTA) 60 MG capsule TAKE ONE CAPSULE BY MOUTH EVERY DAY 30 capsule 2  . furosemide (LASIX) 40 MG tablet TAKE 1 TABLET BY MOUTH TWICE A DAY 180 tablet 1  . glucose blood (ACCU-CHEK AVIVA PLUS) test strip Check blood sugar 3 times a day 100 each 12  . ibuprofen (ADVIL,MOTRIN) 600 MG tablet Take 1 tablet (600 mg total) by mouth every 8 (eight) hours. Take with food. (Patient not taking: Reported on 01/24/2015) 90 tablet 0  . Insulin Pen Needle 32G X 4 MM MISC Use to inject insulin one time a day 100 each 4  . LANTUS SOLOSTAR 100 UNIT/ML injection INJECT 14 UNITS INTO THE SKIN AS DIRECTED AT BEDTIME (Patient not taking: Reported on 01/24/2015) 15 mL 6  . lisinopril (PRINIVIL,ZESTRIL) 40 MG tablet Take  1 tablet (40 mg total) by mouth daily. 90 tablet 4  . metFORMIN (GLUCOPHAGE) 1000 MG tablet TAKE 1 TABLET (1,000 MG TOTAL) BY MOUTH 2 (TWO) TIMES DAILY WITH A MEAL. 60 tablet 2  . NITROSTAT 0.4 MG SL tablet PLACE 1 TABLET UNDER THE TONGUE EVERY 5 MINUTES AS NEEDED FOR CHEST PAIN 25 tablet 0  . omeprazole (PRILOSEC) 20 MG capsule Take 1 capsule (20 mg total) by mouth daily. 30 capsule 1  . simvastatin (ZOCOR) 40 MG tablet Take 1 tablet (40 mg total) by mouth daily. 30 tablet 11  . traMADol (ULTRAM) 50 MG tablet TAKE 1 TABLET BY MOUTH EVERY 6 HOURS AS NEEDED 90 tablet 0    . traZODone (DESYREL) 50 MG tablet Take 1 tablet (50 mg total) by mouth at bedtime as needed. As needed for sleep. (Patient not taking: Reported on 01/24/2015) 30 tablet 2   No current facility-administered medications for this visit.   Family History  Problem Relation Age of Onset  . Breast cancer Father   . Hypertension Mother   . Heart disease Mother   . Heart attack Brother     All siblings passed away  . Cancer      Mother's side of family   Social History   Social History  . Marital Status: Legally Separated    Spouse Name: N/A  . Number of Children: 3  . Years of Education: 12   Occupational History  . Disabled     Social History Main Topics  . Smoking status: Current Some Day Smoker -- 0.20 packs/day    Types: Cigarettes  . Smokeless tobacco: None     Comment: 4 cigs/week.  . Alcohol Use: 0.0 oz/week    0 Standard drinks or equivalent per week     Comment: Wine/beer sometimes.  . Drug Use: No  . Sexual Activity: Not Asked   Other Topics Concern  . None   Social History Narrative   Lives in [place], [place] with son and family.    Review of Systems: Review of Systems  Constitutional: Negative for fever and chills.  Eyes: Positive for blurred vision and pain. Negative for photophobia and discharge.  Respiratory: Negative for shortness of breath.   Cardiovascular: Positive for leg swelling. Negative for chest pain.  Gastrointestinal: Negative for abdominal pain and diarrhea.  Musculoskeletal: Positive for joint pain and falls.  Neurological: Positive for headaches. Negative for dizziness and focal weakness.    Objective:  Physical Exam: Filed Vitals:   11/08/15 1531  BP: 141/64  Pulse: 74  Temp: 98.2 F (36.8 C)  TempSrc: Oral  Weight: 259 lb 12.8 oz (117.845 kg)  SpO2: 98%   GENERAL- alert, co-operative, NAD HEENT- Atraumatic, pupils dilated >10 mm with cataract over majority of right, left optic disc and vessels well visualized and  normal-appearing CARDIAC- RRR, no murmurs, rubs or gallops. RESP- CTAB, no wheezes or crackles. ABDOMEN- Soft, nontender, no guarding or rebound, normoactive bowel sounds present NEURO- No obvious Cr N abnormality, strength upper and lower extremities- 5/5, diminished sensation to light touch in the hands and feet bilaterally EXTREMITIES- symmetric, 1+ pedal edema, knees positive for crepitus on extension with normal range of motion SKIN- Warm, dry, No rash or lesion. PSYCH- Normal mood and affect, appropriate thought content and speech.  Assessment & Plan:

## 2015-11-08 NOTE — Patient Instructions (Signed)
Blood pressure is much improved today and is 141/78. We will keep going with the same medicine as before.  Your diabetes control is worse today hemoglobin A1c of 8.7%, up from 7.6% 4 months ago. It is possible this will get a bit better if she can resume walking more and are consistent with her metformin twice daily. However I would like to start a new medicine today since this uncontrolled diabetes is really only going to worsen the situation with your eyes. This medicine is Glucotrol (glipizide) 5 mg by mouth once daily.  I will send a referral for you to be seen at physical therapy at least once since you have this trouble with your walking. We will try to contact you by phone or by mail about this appointment once we have information.  Good luck with your procedure at the ophthalmologist tomorrow morning. Hopefully we can get good control of her diabetes and this will help get some improved vision with your eye again.  I'll plan to get you back in clinic in about 2 months. If he does so have trouble before then feel free to call always. [phone].

## 2015-11-09 DIAGNOSIS — E113591 Type 2 diabetes mellitus with proliferative diabetic retinopathy without macular edema, right eye: Secondary | ICD-10-CM | POA: Diagnosis not present

## 2015-11-09 DIAGNOSIS — H348312 Tributary (branch) retinal vein occlusion, right eye, stable: Secondary | ICD-10-CM | POA: Diagnosis not present

## 2015-11-09 DIAGNOSIS — H4311 Vitreous hemorrhage, right eye: Secondary | ICD-10-CM | POA: Diagnosis not present

## 2015-11-09 DIAGNOSIS — H2511 Age-related nuclear cataract, right eye: Secondary | ICD-10-CM | POA: Diagnosis not present

## 2015-11-09 MED ORDER — GLIPIZIDE 5 MG PO TABS: 5.0000 mg | ORAL_TABLET | Freq: Every day | ORAL | Status: DC

## 2015-11-09 NOTE — Assessment & Plan Note (Addendum)
Lab Results  Component Value Date   HGBA1C 8.7 11/08/2015   HGBA1C 7.6 07/08/2015   HGBA1C 7.3 04/11/2015     Assessment: Diabetes control: Above goal Progress toward A1C goal:  Worsening Comments: Patient currently on metformin 1000 mg twice daily but was taking no medicines for several weeks out of the past 2 months due to trip to [place] often forgets her p.m. dose of medicines. Also getting less exercise now after living arrangement change staying with her son and daughter-in-law. She has extensive complications from her diabetes. With her obesity and significant change in A1c control with diet and exercise I think her problem is almost entirely on the resistance/utilization side. She was previously on Lantus in addition to metformin but given her normal liver and kidney function I think a better approach might be to try second line oral agent in addition to the metformin before restarting insulin.  Plan: Medications:  Continue metformin 1000 g twice daily, add glipizide 5 mg once daily Home glucose monitoring: Not routinely checking  Instruction/counseling given: reminded to get eye exam, reminded to bring medications to each visit and discussed diet  Other plans: We'll need to reassess her glucose control after starting glipizide and can also titrate the dose up if tolerating it without adverse effects.

## 2015-11-09 NOTE — Assessment & Plan Note (Addendum)
Assessment: Chronic degenerative joint disease in the knees and hips most likely related to her obesity and history of poor nutrition. The level of pain has been stable and not excessive. She is currently taking duloxetine and tramadol mostly for her neuropathy. She is complaining of an increasing frequency with "catching" particularly in her right leg. These often cause her to feel off balance but she denies falling as a result. However she did fall on her way to the clinic today and had minor abrasions on both knees. She has never used any kind of assistive device for walking.  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  Plan: Referral to physical therapy for recommendations on assistive equipment or exercises for gait stability

## 2015-11-09 NOTE — Assessment & Plan Note (Signed)
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 She was seen by ophthalmology with a dilated eye exam this morning which showed recurrence of her cataract and progressive hemorrhage of the retina. She is planning to be seen again tomorrow for surgery. Of note she also fell on her way over from the ophthalmology office which she attributes to extremely poor vision after her dilated eye exam. We will continue to follow her ophthalmology results and attempt to get better control of her diabetes and hypertension.[name]

## 2015-11-09 NOTE — Assessment & Plan Note (Signed)
BP Readings from Last 3 Encounters:  11/08/15 141/64  07/08/15 169/80  04/11/15 170/77    Lab Results  Component Value Date   NA 141 04/11/2015   K 4.4 04/11/2015   CREATININE 0.98 04/11/2015    Assessment: Blood pressure control: Mildly elevated Progress toward BP goal:  Good progress Comments: Her blood pressure is doing much better today after increase in Coreg to 12.5 mg twice daily. She continues to take amlodipine 10 mg, Lasix 40 mg twice a day, lisinopril 40 mg.  Plan: Medications:  continue current medications  Other plans: Her blood pressure is about down to goal today and I will not titrate any of her medicines up at this point. Aggressive control would be appropriate in her due to her retinopathy and chronic systolic heart failure. She is at max dose on her current 3 medications and I would like to see if increasing physical activity a little bit can get her the rest of the way to target blood pressure without starting a fourth daily medicine.

## 2015-11-10 NOTE — Progress Notes (Signed)
I saw and evaluated the patient.  I personally confirmed the key portions of Dr. Rice's history and exam and reviewed pertinent patient test results.  The assessment, diagnosis, and plan were formulated together and I agree with the documentation in the resident's note. 

## 2015-11-21 DIAGNOSIS — E119 Type 2 diabetes mellitus without complications: Secondary | ICD-10-CM | POA: Diagnosis not present

## 2015-11-21 DIAGNOSIS — E083513 Diabetes mellitus due to underlying condition with proliferative diabetic retinopathy with macular edema, bilateral: Secondary | ICD-10-CM | POA: Diagnosis not present

## 2015-11-21 DIAGNOSIS — H4051X0 Glaucoma secondary to other eye disorders, right eye, stage unspecified: Secondary | ICD-10-CM | POA: Diagnosis not present

## 2015-11-21 DIAGNOSIS — H2511 Age-related nuclear cataract, right eye: Secondary | ICD-10-CM | POA: Diagnosis not present

## 2015-11-22 DIAGNOSIS — H4311 Vitreous hemorrhage, right eye: Secondary | ICD-10-CM | POA: Diagnosis not present

## 2015-11-22 DIAGNOSIS — E113591 Type 2 diabetes mellitus with proliferative diabetic retinopathy without macular edema, right eye: Secondary | ICD-10-CM | POA: Diagnosis not present

## 2015-11-22 DIAGNOSIS — H34831 Tributary (branch) retinal vein occlusion, right eye, with macular edema: Secondary | ICD-10-CM | POA: Diagnosis not present

## 2015-11-22 DIAGNOSIS — H4050X2 Glaucoma secondary to other eye disorders, unspecified eye, moderate stage: Secondary | ICD-10-CM | POA: Diagnosis not present

## 2015-11-23 DIAGNOSIS — H25811 Combined forms of age-related cataract, right eye: Secondary | ICD-10-CM | POA: Diagnosis not present

## 2015-11-23 DIAGNOSIS — H2511 Age-related nuclear cataract, right eye: Secondary | ICD-10-CM | POA: Diagnosis not present

## 2015-11-23 DIAGNOSIS — H4311 Vitreous hemorrhage, right eye: Secondary | ICD-10-CM | POA: Diagnosis not present

## 2015-11-23 DIAGNOSIS — H4089 Other specified glaucoma: Secondary | ICD-10-CM | POA: Diagnosis not present

## 2015-11-24 ENCOUNTER — Ambulatory Visit: Payer: Medicare Other | Admitting: Physical Therapy

## 2015-11-29 ENCOUNTER — Other Ambulatory Visit: Payer: Self-pay | Admitting: Internal Medicine

## 2015-12-02 ENCOUNTER — Ambulatory Visit: Payer: Self-pay

## 2015-12-26 NOTE — Addendum Note (Signed)
Addended by: Hulan Fray on: 12/26/2015 05:57 PM   Modules accepted: Orders

## 2015-12-28 DIAGNOSIS — H34831 Tributary (branch) retinal vein occlusion, right eye, with macular edema: Secondary | ICD-10-CM | POA: Diagnosis not present

## 2015-12-28 DIAGNOSIS — H4311 Vitreous hemorrhage, right eye: Secondary | ICD-10-CM | POA: Diagnosis not present

## 2015-12-28 DIAGNOSIS — H2512 Age-related nuclear cataract, left eye: Secondary | ICD-10-CM | POA: Diagnosis not present

## 2015-12-28 DIAGNOSIS — E113591 Type 2 diabetes mellitus with proliferative diabetic retinopathy without macular edema, right eye: Secondary | ICD-10-CM | POA: Diagnosis not present

## 2016-01-11 ENCOUNTER — Encounter: Payer: Self-pay | Admitting: *Deleted

## 2016-01-11 ENCOUNTER — Other Ambulatory Visit: Payer: Self-pay

## 2016-01-12 MED ORDER — AMLODIPINE BESYLATE 10 MG PO TABS: 10.0000 mg | ORAL_TABLET | Freq: Every day | ORAL | Status: DC

## 2016-01-12 MED ORDER — DULOXETINE HCL 60 MG PO CPEP: 60.0000 mg | ORAL_CAPSULE | Freq: Every day | ORAL | Status: DC

## 2016-01-18 DIAGNOSIS — E113591 Type 2 diabetes mellitus with proliferative diabetic retinopathy without macular edema, right eye: Secondary | ICD-10-CM | POA: Diagnosis not present

## 2016-01-18 LAB — HM DIABETES EYE EXAM

## 2016-02-09 ENCOUNTER — Other Ambulatory Visit: Payer: Self-pay | Admitting: Internal Medicine

## 2016-02-10 ENCOUNTER — Telehealth: Payer: Self-pay | Admitting: *Deleted

## 2016-02-10 NOTE — Telephone Encounter (Signed)
Pharmacist at [facility] calls r/t possible interaction between amlodipine and simvastatin, he would like for you to call him back asap [phone]

## 2016-02-13 NOTE — Telephone Encounter (Signed)
Called pharmacy to change simvastatin dose back to 20mg  which she has previously tolerated. Simvastatin 40mg  is contraindicated for this patient as she is taking 10mg  amlodipine. This choice of statin should be addressed and I will aim to do so at next clinic visit.  Collier Salina, MD 02/13/2016, 10:57 AM

## 2016-03-23 ENCOUNTER — Other Ambulatory Visit: Payer: Self-pay | Admitting: Internal Medicine

## 2016-03-25 ENCOUNTER — Other Ambulatory Visit: Payer: Self-pay | Admitting: Internal Medicine

## 2016-03-26 NOTE — Telephone Encounter (Signed)
Agreed, 20mg  is maximum recommended dose due to concurrent medications. I believe I instructed pharmacy to change this from the existing order, but I forgot to discontinue this in our EMR.

## 2016-03-26 NOTE — Telephone Encounter (Signed)
Patient has active prescription for 40 mg. Per your note you wanted this patient to be taking 20 mg.

## 2016-04-20 ENCOUNTER — Encounter: Payer: Self-pay | Admitting: Internal Medicine

## 2016-05-01 ENCOUNTER — Other Ambulatory Visit: Payer: Self-pay

## 2016-05-01 DIAGNOSIS — I10 Essential (primary) hypertension: Secondary | ICD-10-CM

## 2016-05-01 MED ORDER — CARVEDILOL 12.5 MG PO TABS: 12.5000 mg | ORAL_TABLET | Freq: Two times a day (BID) | ORAL | Status: DC

## 2016-05-01 MED ORDER — METFORMIN HCL 1000 MG PO TABS: ORAL_TABLET | ORAL | Status: DC

## 2016-05-14 ENCOUNTER — Other Ambulatory Visit: Payer: Self-pay | Admitting: *Deleted

## 2016-05-14 MED ORDER — SIMVASTATIN 20 MG PO TABS: 20.0000 mg | ORAL_TABLET | Freq: Every day | ORAL | Status: DC

## 2016-05-14 NOTE — Telephone Encounter (Signed)
Requesting 90 day supply. Thanks 

## 2016-05-18 ENCOUNTER — Other Ambulatory Visit: Payer: Self-pay | Admitting: Internal Medicine

## 2016-05-20 ENCOUNTER — Telehealth: Payer: Self-pay | Admitting: Internal Medicine

## 2016-05-23 NOTE — Telephone Encounter (Signed)
Patient is already scheduled to see Dr.Rice on 06-08-16 at 3:45 pm.

## 2016-06-08 ENCOUNTER — Encounter: Payer: Self-pay | Admitting: Internal Medicine

## 2016-06-08 ENCOUNTER — Ambulatory Visit (INDEPENDENT_AMBULATORY_CARE_PROVIDER_SITE_OTHER): Payer: Medicare Other | Admitting: Internal Medicine

## 2016-06-08 VITALS — BP 151/66 | HR 62 | Temp 98.2°F | Ht 66.0 in | Wt 250.3 lb

## 2016-06-08 DIAGNOSIS — I5022 Chronic systolic (congestive) heart failure: Secondary | ICD-10-CM

## 2016-06-08 DIAGNOSIS — E113599 Type 2 diabetes mellitus with proliferative diabetic retinopathy without macular edema, unspecified eye: Secondary | ICD-10-CM | POA: Diagnosis not present

## 2016-06-08 DIAGNOSIS — Z7984 Long term (current) use of oral hypoglycemic drugs: Secondary | ICD-10-CM

## 2016-06-08 DIAGNOSIS — Z9181 History of falling: Secondary | ICD-10-CM

## 2016-06-08 DIAGNOSIS — E113553 Type 2 diabetes mellitus with stable proliferative diabetic retinopathy, bilateral: Secondary | ICD-10-CM | POA: Diagnosis not present

## 2016-06-08 DIAGNOSIS — Z596 Low income: Secondary | ICD-10-CM | POA: Diagnosis not present

## 2016-06-08 DIAGNOSIS — F17210 Nicotine dependence, cigarettes, uncomplicated: Secondary | ICD-10-CM

## 2016-06-08 DIAGNOSIS — E11319 Type 2 diabetes mellitus with unspecified diabetic retinopathy without macular edema: Secondary | ICD-10-CM

## 2016-06-08 DIAGNOSIS — R296 Repeated falls: Secondary | ICD-10-CM

## 2016-06-08 DIAGNOSIS — I10 Essential (primary) hypertension: Secondary | ICD-10-CM | POA: Diagnosis not present

## 2016-06-08 DIAGNOSIS — T730XXA Starvation, initial encounter: Secondary | ICD-10-CM

## 2016-06-08 DIAGNOSIS — E1142 Type 2 diabetes mellitus with diabetic polyneuropathy: Secondary | ICD-10-CM

## 2016-06-08 DIAGNOSIS — Z5948 Other specified lack of adequate food: Secondary | ICD-10-CM

## 2016-06-08 LAB — POCT GLYCOSYLATED HEMOGLOBIN (HGB A1C): HEMOGLOBIN A1C: 7.1

## 2016-06-08 LAB — GLUCOSE, CAPILLARY: Glucose-Capillary: 148 mg/dL — ABNORMAL HIGH (ref 65–99)

## 2016-06-08 NOTE — Progress Notes (Signed)
   CC: Follow up for diabetes  HPI:  [name] is a 60 y.o. female with PMHx detailed below presenting for follow up of her diabetes with numerous complications. She has been under a large amount of life stressors lately being displaced from her home and having trouble obtaining adequate food and all her medications due to finances.  See problem based assessment and plan below for additional details.  Past Medical History  Diagnosis Date  . Osteoarthritis   . Diabetes mellitus   . Hyperlipidemia   . History of cocaine abuse   . Hypertension   . Adenocarcinoma, colon ([place]) 11/09    s/p subtotal colectomy with primary anastamosis by Dr. Excell Seltzer; Chemotherapy per Dr. Jonette Eva   . patient also endorses chronic intermittent abdominal aching and loose stools a/c with her colon cancer and Sx.   . Carpal tunnel syndrome   . Peripheral neuropathy ([place])   . Chronic systolic heart failure ([place])     LHC 01/04/12: Luminal irregularities, no significant CAD, elevated LVEDP;  Echocardiogram 01/03/12: Mild LVH, EF 35-40%, moderate LAE.    [name] Depression   . NICM (nonischemic cardiomyopathy) ([place])     ? HTN cardiomyopathy    Review of Systems: Review of Systems  Eyes: Positive for blurred vision. Negative for pain.  Cardiovascular: Negative for leg swelling.  Musculoskeletal: Positive for falls.  Neurological: Positive for sensory change. Negative for headaches.  Endo/Heme/Allergies: Negative for polydipsia.     Physical Exam: Filed Vitals:   06/08/16 1556  BP: 151/66  Pulse: 62  Temp: 98.2 F (36.8 C)  TempSrc: Oral  Height: 5\' 6"  (1.676 m)  Weight: 250 lb 4.8 oz (113.535 kg)  SpO2: 100%   GENERAL- alert, co-operative, NAD HEENT- Some conjunctival hyperemia bilaterally CARDIAC- RRR, no murmurs RESP- CTAB, no wheezes or crackles NEURO- Strength upper and lower extremities- 5/5, diminished sensation to light touch in the hands and feet bilaterally EXTREMITIES- symmetric,  knees positive for crepitus on extension with normal range of motion SKIN- Warm, dry, No rash or lesion  Filed Vitals:   06/08/16 1556  BP: 151/66  Pulse: 62  Temp: 98.2 F (36.8 C)  TempSrc: Oral  Height: 5\' 6"  (1.676 m)  Weight: 250 lb 4.8 oz (113.535 kg)  SpO2: 100%    Assessment & Plan:   See encounters tab for problem based medical decision making.   Patient discussed with Dr. [name]

## 2016-06-08 NOTE — Patient Instructions (Signed)
It was a pleasure to see you again today [name]. I am sorry to hear you are having such a tough time at home.  You are doing a wonderful job taking your medications with your diabetes is much better controlled today with a hemoglobin A1c of 7.1%. This is the best thing you can do to prevent future problems of your vision or neuropathy getting worse.  Your frequent falls are concerning and I think you probably need at least a cane as an assisting device. We will also call about having you seen to decide what is the best equipment for improving your mobility and safety.  Please call us back or arrange another appointment sooner if you have new problems or questions.

## 2016-06-09 LAB — BMP8+ANION GAP
Anion Gap: 15 mmol/L (ref 10.0–18.0)
BUN/Creatinine Ratio: 9 — ABNORMAL LOW (ref 12–28)
BUN: 13 mg/dL (ref 8–27)
CALCIUM: 9.7 mg/dL (ref 8.7–10.3)
CHLORIDE: 103 mmol/L (ref 96–106)
CO2: 23 mmol/L (ref 18–29)
Creatinine, Ser: 1.5 mg/dL — ABNORMAL HIGH (ref 0.57–1.00)
GFR calc Af Amer: 43 mL/min/{1.73_m2} — ABNORMAL LOW (ref >59)
GFR calc non Af Amer: 38 mL/min/{1.73_m2} — ABNORMAL LOW (ref >59)
GLUCOSE: 156 mg/dL — AB (ref 65–99)
Potassium: 5.7 mmol/L — ABNORMAL HIGH (ref 3.5–5.2)
Sodium: 141 mmol/L (ref 134–144)

## 2016-06-09 NOTE — Assessment & Plan Note (Signed)
Lab Results  Component Value Date   HGBA1C 7.1 06/08/2016   HGBA1C 8.7 11/08/2015   HGBA1C 7.6 07/08/2015     Assessment: Diabetes control: Good control Progress toward A1C goal:  Improving Comments: Debra Graham is doing well with her metformin and glipizide. She has also had about a 9 pound weight loss since her last visit in December. She has poor sensation probably contributing to her falls but her feet and legs look fine on exam today. She appears to have last seen Dr. Zenia Resides clinic in January.  Plan: Medications:  continue current medications Instruction/counseling given: reminded to get eye exam Other plans: Follow up in 3 months

## 2016-06-09 NOTE — Assessment & Plan Note (Addendum)
Assessment: She has some healed abrasions on both knees today and reports falling multiple times at least twice this month. She does not complain of dizziness or weakness. She has combined problems of obesity, degenerative joint disease, poor eyesight, and diminished proprioception from her neuropathy. Fortunately she has not had a major complication from these but I think she could benefit from some assistive device at this point.  Plan; Referral to PT Medical equipment- wide based cane

## 2016-06-09 NOTE — Assessment & Plan Note (Signed)
She has been struggling financially the past few months on a fixed income. We provided some food at clinic today and recommendations of some local resources.

## 2016-06-11 NOTE — Progress Notes (Signed)
Medicine attending: Medical history, presenting problems, physical findings, and medications, reviewed with resident physician Dr Christopher Rice on the day of the patient visit and I concur with his evaluation and management plan. 

## 2016-06-20 ENCOUNTER — Other Ambulatory Visit: Payer: Self-pay | Admitting: Internal Medicine

## 2016-06-20 DIAGNOSIS — I10 Essential (primary) hypertension: Secondary | ICD-10-CM

## 2016-06-20 DIAGNOSIS — E875 Hyperkalemia: Secondary | ICD-10-CM

## 2016-06-21 NOTE — Telephone Encounter (Signed)
Last appt 06/08/16. Next appt 09/21/16.

## 2016-06-29 ENCOUNTER — Ambulatory Visit: Payer: Medicare Other | Admitting: Physical Therapy

## 2016-07-30 NOTE — Addendum Note (Signed)
Addended by: Hulan Fray on: 07/30/2016 06:50 PM   Modules accepted: Orders

## 2016-08-12 ENCOUNTER — Other Ambulatory Visit: Payer: Self-pay | Admitting: Internal Medicine

## 2016-09-15 ENCOUNTER — Other Ambulatory Visit: Payer: Self-pay | Admitting: Internal Medicine

## 2016-09-15 DIAGNOSIS — I10 Essential (primary) hypertension: Secondary | ICD-10-CM

## 2016-09-17 ENCOUNTER — Encounter: Payer: Self-pay | Admitting: *Deleted

## 2016-09-20 ENCOUNTER — Telehealth: Payer: Self-pay | Admitting: Internal Medicine

## 2016-09-20 NOTE — Telephone Encounter (Signed)
APT. REMINDER CALL, LMTCB °

## 2016-09-21 ENCOUNTER — Encounter: Payer: Self-pay | Admitting: Internal Medicine

## 2016-10-31 ENCOUNTER — Other Ambulatory Visit: Payer: Self-pay | Admitting: *Deleted

## 2016-10-31 NOTE — Telephone Encounter (Signed)
APPT 12/22 WITH PCP

## 2016-11-01 MED ORDER — AMLODIPINE BESYLATE 10 MG PO TABS: 10.0000 mg | ORAL_TABLET | Freq: Every day | ORAL | 0 refills | Status: DC

## 2016-11-08 ENCOUNTER — Other Ambulatory Visit: Payer: Self-pay | Admitting: Internal Medicine

## 2016-11-08 ENCOUNTER — Telehealth: Payer: Self-pay | Admitting: Internal Medicine

## 2016-11-08 DIAGNOSIS — I10 Essential (primary) hypertension: Secondary | ICD-10-CM

## 2016-11-08 NOTE — Telephone Encounter (Signed)
APT. REMINDER CALL, LMTCB °

## 2016-11-09 ENCOUNTER — Other Ambulatory Visit: Payer: Self-pay | Admitting: Internal Medicine

## 2016-11-09 ENCOUNTER — Encounter (INDEPENDENT_AMBULATORY_CARE_PROVIDER_SITE_OTHER): Payer: Self-pay

## 2016-11-09 ENCOUNTER — Encounter: Payer: Self-pay | Admitting: Internal Medicine

## 2016-11-09 ENCOUNTER — Ambulatory Visit (INDEPENDENT_AMBULATORY_CARE_PROVIDER_SITE_OTHER): Payer: Medicare Other | Admitting: Internal Medicine

## 2016-11-09 VITALS — BP 191/77 | HR 66 | Temp 98.6°F | Ht 66.0 in | Wt 257.5 lb

## 2016-11-09 DIAGNOSIS — E113553 Type 2 diabetes mellitus with stable proliferative diabetic retinopathy, bilateral: Secondary | ICD-10-CM

## 2016-11-09 DIAGNOSIS — Z7984 Long term (current) use of oral hypoglycemic drugs: Secondary | ICD-10-CM | POA: Diagnosis not present

## 2016-11-09 DIAGNOSIS — Z0000 Encounter for general adult medical examination without abnormal findings: Secondary | ICD-10-CM

## 2016-11-09 DIAGNOSIS — E113399 Type 2 diabetes mellitus with moderate nonproliferative diabetic retinopathy without macular edema, unspecified eye: Secondary | ICD-10-CM

## 2016-11-09 DIAGNOSIS — Z79899 Other long term (current) drug therapy: Secondary | ICD-10-CM

## 2016-11-09 DIAGNOSIS — E11319 Type 2 diabetes mellitus with unspecified diabetic retinopathy without macular edema: Secondary | ICD-10-CM | POA: Diagnosis present

## 2016-11-09 DIAGNOSIS — F17210 Nicotine dependence, cigarettes, uncomplicated: Secondary | ICD-10-CM

## 2016-11-09 DIAGNOSIS — I10 Essential (primary) hypertension: Secondary | ICD-10-CM | POA: Diagnosis not present

## 2016-11-09 DIAGNOSIS — Z23 Encounter for immunization: Secondary | ICD-10-CM | POA: Diagnosis not present

## 2016-11-09 DIAGNOSIS — Z596 Low income: Secondary | ICD-10-CM | POA: Diagnosis not present

## 2016-11-09 LAB — GLUCOSE, CAPILLARY: Glucose-Capillary: 93 mg/dL (ref 65–99)

## 2016-11-09 LAB — POCT GLYCOSYLATED HEMOGLOBIN (HGB A1C): Hemoglobin A1C: 6.5

## 2016-11-09 MED ORDER — CARVEDILOL 12.5 MG PO TABS: ORAL_TABLET | ORAL | 0 refills | Status: AC

## 2016-11-09 MED ORDER — AMLODIPINE BESYLATE 10 MG PO TABS: 10.0000 mg | ORAL_TABLET | Freq: Every day | ORAL | 0 refills | Status: DC

## 2016-11-09 NOTE — Patient Instructions (Signed)
It was a pleasure to see you today [name]. I am sad to hear you are leaving [place] but I am glad you have an opportunity to stay with family even if it is out of state.  Your diabetes is well controlled today with an A1c 6.5%. However I would like to check your blood work again because of small changes seen when checking this in July. I will call you with any findings that need a change in plan otherwise your records will be sent to whichever clinic you are able to see for care in [place].  Good luck and I hope your transition is safe! Please call us back if you have problems or questions.

## 2016-11-09 NOTE — Progress Notes (Signed)
   CC: Follow up for diabetes, hypertension  HPI:  Ms.Sameria P Dicola is a 60 y.o. female with PMHx detailed below presenting for follow up of her diabetes with numerous complications. She is moving to [place] this week and is visitng here primarily for medication refills prior to [place] our system.  See problem based assessment and plan below for additional details.  Past Medical History:  Diagnosis Date  . Adenocarcinoma, colon ([place]) 11/09   s/p subtotal colectomy with primary anastamosis by Dr. Excell Seltzer; Chemotherapy per Dr. Jonette Eva   . patient also endorses chronic intermittent abdominal aching and loose stools a/c with her colon cancer and Sx.   . Carpal tunnel syndrome   . Chronic systolic heart failure ([place])    LHC 01/04/12: Luminal irregularities, no significant CAD, elevated LVEDP;  Echocardiogram 01/03/12: Mild LVH, EF 35-40%, moderate LAE.    [name] Depression   . Diabetes mellitus   . History of cocaine abuse   . Hyperlipidemia   . Hypertension   . NICM (nonischemic cardiomyopathy) ([place])    ? HTN cardiomyopathy  . Osteoarthritis   . Peripheral neuropathy ([place])     Review of Systems:  Review of Systems  Constitutional: Negative for fever.  Eyes: Positive for blurred vision.  Cardiovascular: Negative for leg swelling.  Gastrointestinal: Negative for abdominal pain.  Genitourinary: Negative for dysuria.  Musculoskeletal: Negative for falls.  Skin: Negative for rash.  Neurological: Negative for sensory change and headaches.  Endo/Heme/Allergies: Negative for polydipsia.       Physical Exam: Vitals:   11/09/16 1437  BP: (!) 191/77  Pulse: 66  Temp: 98.6 F (37 C)  TempSrc: Oral  SpO2: 98%  Weight: 257 lb 8 oz (116.8 kg)  Height: 5\' 6"  (1.676 m)   GENERAL- Tearful, alert and cooperative HEENT- Conjunctiva normal, on JVD CARDIAC- RRR, no murmurs RESP- CTAB, no wheezes or crackles NEURO- Strength upper and lower extremities- 5/5, EXTREMITIES-  symmetric, no pedal edema, knees with crepitus SKIN- Warm, dry, No rash or lesion   Assessment & Plan:   See encounters tab for problem based medical decision making.   Patient discussed with Dr. Daryll Drown

## 2016-11-10 LAB — BMP8+ANION GAP
Anion Gap: 18 mmol/L (ref 10.0–18.0)
BUN / CREAT RATIO: 18 (ref 12–28)
BUN: 18 mg/dL (ref 8–27)
CALCIUM: 9.3 mg/dL (ref 8.7–10.3)
CHLORIDE: 108 mmol/L — AB (ref 96–106)
CO2: 17 mmol/L — ABNORMAL LOW (ref 18–29)
Creatinine, Ser: 1.02 mg/dL — ABNORMAL HIGH (ref 0.57–1.00)
GFR, EST AFRICAN AMERICAN: 69 mL/min/{1.73_m2} (ref >59)
GFR, EST NON AFRICAN AMERICAN: 60 mL/min/{1.73_m2} (ref >59)
Glucose: 81 mg/dL (ref 65–99)
POTASSIUM: 5.2 mmol/L (ref 3.5–5.2)
SODIUM: 143 mmol/L (ref 134–144)

## 2016-11-14 NOTE — Assessment & Plan Note (Signed)
Lab Results  Component Value Date   HGBA1C 6.5 11/09/2016   HGBA1C 7.1 06/08/2016   HGBA1C 8.7 11/08/2015     Assessment: Diabetes control: Controlled Progress toward A1C goal:  At goal Comments: Debra Graham has been compliant with her metformin 100mg  BID and glipizide 5mg  despite her financial difficulties. She still is not very physically active and her diet is poor due to lack of access to appropriate food and extremely limited finances.  Plan: Medications:  continue current medications She does not need titration up on the glipizide based on improving control this year. Other plans: The plan today is to continue home medications with a 90 day supply on the current regimen prior to her transferring care to [place]. This was refilled via earlier pharmacy request.   Basic metabolic panel checked that was back to around baseline values.  She was reminded to also establish care with opthalmology follow up when able since her last eye exam was in 3/17 with significant existing retinopthy.

## 2016-11-14 NOTE — Assessment & Plan Note (Signed)
  Assessment: She is quite hypertensive in clinic today 190/80s although this may be confounded a bit with her actively crying and stressed about relocating away from [place]. She denies any associated symptoms such as vision changes, headaches, dyspnea, chest pain so no indication of a hypertensive emergency. She also needs a refill of her amlodipine and carvedilol today.  Plan: Medications:  Coreg to 12.5 mg BID, amlodipine 10 mg, Lasix 40 mg BID, lisinopril 40 mg. Other plans: Medication refills provided for 90 day supply. I recommended her to seek medical evaluation or call back to our clinic if she develops concerning symptoms.

## 2016-11-14 NOTE — Assessment & Plan Note (Signed)
Flu shot today 

## 2016-11-23 NOTE — Progress Notes (Signed)
Internal Medicine Clinic Attending  Case discussed with Dr. Rice at the time of the visit.  We reviewed the resident's history and exam and pertinent patient test results.  I agree with the assessment, diagnosis, and plan of care documented in the resident's note.  

## 2016-12-04 ENCOUNTER — Telehealth: Payer: Self-pay | Admitting: Internal Medicine

## 2016-12-04 NOTE — Telephone Encounter (Signed)
Pls call patient back in regards to all of her medications.  She is now in [place] and is using a [facility] and needs refills.  Pt has only given a fax number on [phone].

## 2016-12-06 ENCOUNTER — Other Ambulatory Visit: Payer: Self-pay | Admitting: Internal Medicine

## 2016-12-07 ENCOUNTER — Other Ambulatory Visit: Payer: Self-pay | Admitting: *Deleted

## 2016-12-07 DIAGNOSIS — E875 Hyperkalemia: Secondary | ICD-10-CM

## 2016-12-07 DIAGNOSIS — E113599 Type 2 diabetes mellitus with proliferative diabetic retinopathy without macular edema, unspecified eye: Secondary | ICD-10-CM

## 2016-12-07 DIAGNOSIS — I10 Essential (primary) hypertension: Secondary | ICD-10-CM

## 2016-12-11 ENCOUNTER — Other Ambulatory Visit: Payer: Self-pay

## 2016-12-11 DIAGNOSIS — I10 Essential (primary) hypertension: Secondary | ICD-10-CM

## 2016-12-11 MED ORDER — LISINOPRIL 40 MG PO TABS: 40.0000 mg | ORAL_TABLET | Freq: Every day | ORAL | 0 refills | Status: AC

## 2016-12-11 MED ORDER — FUROSEMIDE 40 MG PO TABS: 40.0000 mg | ORAL_TABLET | Freq: Two times a day (BID) | ORAL | 0 refills | Status: DC

## 2016-12-11 MED ORDER — GLIPIZIDE 5 MG PO TABS: 5.0000 mg | ORAL_TABLET | Freq: Every day | ORAL | 0 refills | Status: AC

## 2016-12-11 MED ORDER — DULOXETINE HCL 60 MG PO CPEP: 60.0000 mg | ORAL_CAPSULE | Freq: Every day | ORAL | 0 refills | Status: DC

## 2016-12-11 MED ORDER — AMLODIPINE BESYLATE 10 MG PO TABS: 10.0000 mg | ORAL_TABLET | Freq: Every day | ORAL | 0 refills | Status: DC

## 2016-12-11 MED ORDER — SIMVASTATIN 20 MG PO TABS: 20.0000 mg | ORAL_TABLET | Freq: Every day | ORAL | 0 refills | Status: DC

## 2016-12-11 NOTE — Telephone Encounter (Signed)
I will refill these medications for a 1 month supply only. [place] is [place] of [place] and we cannot adequately assess her need for these medicines on a chronic basis.  She MUST establish care with a new provider or inform of [place] an established future appointment date before any more refills can be ordered.  I was unable to reach her by phone for the past 2 days to try and convey this message.

## 2016-12-11 NOTE — Telephone Encounter (Signed)
Patient called stated she has moved to [facility] and needs authorization to get her medications filled per the pahrmacy. Advised patient that medicaid will not pay in another state she could cross back over into [place] to fill her medication but I could not do anything about authorization. Called pharmacy they stated she did not need authorization she needs refill.

## 2016-12-11 NOTE — Telephone Encounter (Signed)
Called patient to let her know pharmacy said she needs refill on amlodipine and duloxetine, refill request sent to doctor. She became very loud and states she needs all her medicines. Asked patient if she could specify which meds she needed she said just forget it I will just die and hung up called number back no answer.

## 2016-12-11 NOTE — Telephone Encounter (Signed)
Debra Graham from [facility] requesting amLODipine (NORVASC) 10 MG tablet to be filled for 90 days supply.

## 2017-01-08 ENCOUNTER — Other Ambulatory Visit: Payer: Self-pay | Admitting: Internal Medicine

## 2017-01-08 DIAGNOSIS — I10 Essential (primary) hypertension: Secondary | ICD-10-CM

## 2017-01-08 NOTE — Telephone Encounter (Signed)
Pt states she has moved to [place] but has not found a doctor yet.

## 2017-02-02 ENCOUNTER — Other Ambulatory Visit: Payer: Self-pay | Admitting: Internal Medicine

## 2017-02-10 ENCOUNTER — Other Ambulatory Visit: Payer: Self-pay | Admitting: Internal Medicine

## 2017-02-10 DIAGNOSIS — I10 Essential (primary) hypertension: Secondary | ICD-10-CM

## 2017-02-12 NOTE — Telephone Encounter (Signed)
I will not continue refilling Debra Graham' medications past the end of this month as it has been over 3 months since any visit with our clinic. She lives in [place] now since December. It is not appropriate to continue treating with these medications indefinitely with no ability to assess her condition or symptoms.  She will need to set herself up with a new PCP in [place] or see Urgent care in the interval to continue refilling her medication.

## 2017-03-09 ENCOUNTER — Other Ambulatory Visit: Payer: Self-pay | Admitting: Internal Medicine

## 2017-03-09 DIAGNOSIS — E113599 Type 2 diabetes mellitus with proliferative diabetic retinopathy without macular edema, unspecified eye: Secondary | ICD-10-CM

## 2017-03-09 DIAGNOSIS — I10 Essential (primary) hypertension: Secondary | ICD-10-CM

## 2017-03-09 DIAGNOSIS — E875 Hyperkalemia: Secondary | ICD-10-CM

## 2017-03-11 ENCOUNTER — Telehealth: Payer: Self-pay | Admitting: Internal Medicine

## 2017-03-11 NOTE — Telephone Encounter (Signed)
Dr Software engineer, this has been ongoing since December, can you address this issue of refills and I will call pt back.

## 2017-03-11 NOTE — Telephone Encounter (Signed)
Her last appt was in Dec and her BP was quite high. If she wants RF, she will need to come to clinic and be seen. Otherwise, she will need to go to an urgent care or walk in clinic to be assessed and get RF until she gets her new PCP.

## 2017-03-11 NOTE — Telephone Encounter (Signed)
Patient is Transferring her care and has moved to [facility].  Patient states she has an appointment on 03/22/2017 and the Dr. Lilian Coma not refill any of her meds until she is seen.  Patient would like a call back to discuss the Transferring of her Medications and will have the phone number available upon your phone call.

## 2017-03-11 NOTE — Telephone Encounter (Signed)
Called pt and delivered message, she states she cannot travel here and is not sure she will be seen in June, she states her appt is June not may and will be at 0800. States she hardly ever has transportation

## 2017-04-06 ENCOUNTER — Other Ambulatory Visit: Payer: Self-pay | Admitting: Internal Medicine

## 2017-04-06 DIAGNOSIS — E113599 Type 2 diabetes mellitus with proliferative diabetic retinopathy without macular edema, unspecified eye: Secondary | ICD-10-CM

## 2017-04-06 DIAGNOSIS — E875 Hyperkalemia: Secondary | ICD-10-CM

## 2017-04-06 DIAGNOSIS — I10 Essential (primary) hypertension: Secondary | ICD-10-CM

## 2017-04-22 DIAGNOSIS — E113211 Type 2 diabetes mellitus with mild nonproliferative diabetic retinopathy with macular edema, right eye: Secondary | ICD-10-CM | POA: Diagnosis not present

## 2017-04-22 DIAGNOSIS — Z85038 Personal history of other malignant neoplasm of large intestine: Secondary | ICD-10-CM | POA: Diagnosis not present

## 2017-04-22 DIAGNOSIS — I10 Essential (primary) hypertension: Secondary | ICD-10-CM | POA: Diagnosis not present

## 2017-04-22 DIAGNOSIS — E1165 Type 2 diabetes mellitus with hyperglycemia: Secondary | ICD-10-CM | POA: Diagnosis not present

## 2017-04-22 DIAGNOSIS — E1169 Type 2 diabetes mellitus with other specified complication: Secondary | ICD-10-CM | POA: Diagnosis not present

## 2017-04-22 DIAGNOSIS — E785 Hyperlipidemia, unspecified: Secondary | ICD-10-CM | POA: Diagnosis not present

## 2017-04-22 DIAGNOSIS — E669 Obesity, unspecified: Secondary | ICD-10-CM | POA: Diagnosis not present

## 2017-04-22 DIAGNOSIS — I519 Heart disease, unspecified: Secondary | ICD-10-CM | POA: Diagnosis not present

## 2017-05-10 DIAGNOSIS — I519 Heart disease, unspecified: Secondary | ICD-10-CM | POA: Diagnosis not present

## 2017-05-10 DIAGNOSIS — E1165 Type 2 diabetes mellitus with hyperglycemia: Secondary | ICD-10-CM | POA: Diagnosis not present

## 2018-08-18 ENCOUNTER — Other Ambulatory Visit (HOSPITAL_COMMUNITY): Payer: Self-pay | Admitting: Cardiology

## 2018-08-18 DIAGNOSIS — I509 Heart failure, unspecified: Secondary | ICD-10-CM

## 2018-08-29 ENCOUNTER — Ambulatory Visit (HOSPITAL_COMMUNITY): Payer: Medicare Other

## 2018-08-29 ENCOUNTER — Encounter (HOSPITAL_COMMUNITY): Payer: Self-pay

## 2019-01-18 DIAGNOSIS — I509 Heart failure, unspecified: Secondary | ICD-10-CM | POA: Diagnosis not present

## 2019-01-18 DIAGNOSIS — I5023 Acute on chronic systolic (congestive) heart failure: Secondary | ICD-10-CM | POA: Diagnosis not present

## 2019-01-18 DIAGNOSIS — J9601 Acute respiratory failure with hypoxia: Secondary | ICD-10-CM | POA: Diagnosis not present

## 2019-01-18 DIAGNOSIS — I130 Hypertensive heart and chronic kidney disease with heart failure and stage 1 through stage 4 chronic kidney disease, or unspecified chronic kidney disease: Secondary | ICD-10-CM | POA: Diagnosis not present

## 2019-01-19 DIAGNOSIS — I428 Other cardiomyopathies: Secondary | ICD-10-CM | POA: Diagnosis not present

## 2019-01-19 DIAGNOSIS — N189 Chronic kidney disease, unspecified: Secondary | ICD-10-CM | POA: Diagnosis not present

## 2019-01-19 DIAGNOSIS — I130 Hypertensive heart and chronic kidney disease with heart failure and stage 1 through stage 4 chronic kidney disease, or unspecified chronic kidney disease: Secondary | ICD-10-CM | POA: Diagnosis not present

## 2019-01-19 DIAGNOSIS — I509 Heart failure, unspecified: Secondary | ICD-10-CM | POA: Diagnosis not present

## 2019-01-19 DIAGNOSIS — I5021 Acute systolic (congestive) heart failure: Secondary | ICD-10-CM | POA: Diagnosis not present

## 2019-01-19 DIAGNOSIS — I5023 Acute on chronic systolic (congestive) heart failure: Secondary | ICD-10-CM | POA: Diagnosis not present

## 2019-01-19 DIAGNOSIS — J9601 Acute respiratory failure with hypoxia: Secondary | ICD-10-CM | POA: Diagnosis not present

## 2019-01-20 DIAGNOSIS — I509 Heart failure, unspecified: Secondary | ICD-10-CM | POA: Diagnosis not present

## 2019-01-20 DIAGNOSIS — I5023 Acute on chronic systolic (congestive) heart failure: Secondary | ICD-10-CM | POA: Diagnosis not present

## 2019-01-20 DIAGNOSIS — J9601 Acute respiratory failure with hypoxia: Secondary | ICD-10-CM | POA: Diagnosis not present

## 2019-01-20 DIAGNOSIS — I130 Hypertensive heart and chronic kidney disease with heart failure and stage 1 through stage 4 chronic kidney disease, or unspecified chronic kidney disease: Secondary | ICD-10-CM | POA: Diagnosis not present

## 2019-02-18 DEATH — deceased
